# Patient Record
Sex: Male | Born: 1940 | Race: White | Hispanic: No | Marital: Married | Smoking: Never smoker
Health system: Southern US, Community
[De-identification: ages and names within clinical notes are randomized; demographics above are authoritative.]

## PROBLEM LIST (undated history)

## (undated) DIAGNOSIS — D649 Anemia, unspecified: Secondary | ICD-10-CM

## (undated) DIAGNOSIS — H9190 Unspecified hearing loss, unspecified ear: Secondary | ICD-10-CM

## (undated) DIAGNOSIS — K5790 Diverticulosis of intestine, part unspecified, without perforation or abscess without bleeding: Secondary | ICD-10-CM

## (undated) DIAGNOSIS — M1990 Unspecified osteoarthritis, unspecified site: Secondary | ICD-10-CM

## (undated) DIAGNOSIS — R519 Headache, unspecified: Secondary | ICD-10-CM

## (undated) DIAGNOSIS — J302 Other seasonal allergic rhinitis: Secondary | ICD-10-CM

## (undated) DIAGNOSIS — B9681 Helicobacter pylori [H. pylori] as the cause of diseases classified elsewhere: Secondary | ICD-10-CM

## (undated) DIAGNOSIS — M519 Unspecified thoracic, thoracolumbar and lumbosacral intervertebral disc disorder: Secondary | ICD-10-CM

## (undated) DIAGNOSIS — G43909 Migraine, unspecified, not intractable, without status migrainosus: Secondary | ICD-10-CM

## (undated) DIAGNOSIS — G8929 Other chronic pain: Secondary | ICD-10-CM

## (undated) DIAGNOSIS — K219 Gastro-esophageal reflux disease without esophagitis: Secondary | ICD-10-CM

## (undated) DIAGNOSIS — I10 Essential (primary) hypertension: Secondary | ICD-10-CM

## (undated) DIAGNOSIS — H409 Unspecified glaucoma: Secondary | ICD-10-CM

## (undated) DIAGNOSIS — M549 Dorsalgia, unspecified: Secondary | ICD-10-CM

## (undated) DIAGNOSIS — K279 Peptic ulcer, site unspecified, unspecified as acute or chronic, without hemorrhage or perforation: Secondary | ICD-10-CM

## (undated) DIAGNOSIS — R51 Headache: Secondary | ICD-10-CM

## (undated) DIAGNOSIS — K635 Polyp of colon: Secondary | ICD-10-CM

## (undated) DIAGNOSIS — G4700 Insomnia, unspecified: Secondary | ICD-10-CM

## (undated) HISTORY — DX: Anemia, unspecified: D64.9

## (undated) HISTORY — DX: Polyp of colon: K63.5

## (undated) HISTORY — DX: Other seasonal allergic rhinitis: J30.2

## (undated) HISTORY — DX: Insomnia, unspecified: G47.00

## (undated) HISTORY — DX: Unspecified thoracic, thoracolumbar and lumbosacral intervertebral disc disorder: M51.9

## (undated) HISTORY — DX: Gastro-esophageal reflux disease without esophagitis: K21.9

## (undated) HISTORY — DX: Essential (primary) hypertension: I10

## (undated) HISTORY — PX: APPENDECTOMY: SHX54

## (undated) HISTORY — DX: Helicobacter pylori (H. pylori) as the cause of diseases classified elsewhere: B96.81

## (undated) HISTORY — PX: CYST EXCISION: SHX5701

## (undated) HISTORY — DX: Diverticulosis of intestine, part unspecified, without perforation or abscess without bleeding: K57.90

## (undated) HISTORY — DX: Helicobacter pylori (H. pylori) as the cause of diseases classified elsewhere: K27.9

---

## 2002-03-05 ENCOUNTER — Ambulatory Visit (HOSPITAL_BASED_OUTPATIENT_CLINIC_OR_DEPARTMENT_OTHER): Admission: RE | Admit: 2002-03-05 | Discharge: 2002-03-05 | Payer: Self-pay | Admitting: General Surgery

## 2005-04-05 ENCOUNTER — Ambulatory Visit: Payer: Self-pay | Admitting: Internal Medicine

## 2005-04-27 ENCOUNTER — Ambulatory Visit: Payer: Self-pay | Admitting: Internal Medicine

## 2006-06-08 ENCOUNTER — Ambulatory Visit (HOSPITAL_COMMUNITY): Admission: RE | Admit: 2006-06-08 | Discharge: 2006-06-08 | Payer: Self-pay | Admitting: Neurological Surgery

## 2008-08-27 ENCOUNTER — Ambulatory Visit (HOSPITAL_COMMUNITY): Admission: RE | Admit: 2008-08-27 | Discharge: 2008-08-27 | Payer: Self-pay | Admitting: Neurological Surgery

## 2009-06-25 ENCOUNTER — Encounter (INDEPENDENT_AMBULATORY_CARE_PROVIDER_SITE_OTHER): Payer: Self-pay | Admitting: *Deleted

## 2009-06-25 ENCOUNTER — Encounter: Admission: RE | Admit: 2009-06-25 | Discharge: 2009-06-25 | Payer: Self-pay | Admitting: Internal Medicine

## 2009-09-23 ENCOUNTER — Encounter: Payer: Self-pay | Admitting: Internal Medicine

## 2009-09-26 ENCOUNTER — Ambulatory Visit: Payer: Self-pay | Admitting: Internal Medicine

## 2009-09-26 DIAGNOSIS — D649 Anemia, unspecified: Secondary | ICD-10-CM | POA: Insufficient documentation

## 2009-09-26 DIAGNOSIS — R110 Nausea: Secondary | ICD-10-CM | POA: Insufficient documentation

## 2009-09-26 DIAGNOSIS — K5900 Constipation, unspecified: Secondary | ICD-10-CM | POA: Insufficient documentation

## 2009-09-26 DIAGNOSIS — R1084 Generalized abdominal pain: Secondary | ICD-10-CM | POA: Insufficient documentation

## 2009-09-29 ENCOUNTER — Ambulatory Visit: Payer: Self-pay | Admitting: Internal Medicine

## 2009-09-30 ENCOUNTER — Encounter: Payer: Self-pay | Admitting: Internal Medicine

## 2009-09-30 ENCOUNTER — Telehealth (INDEPENDENT_AMBULATORY_CARE_PROVIDER_SITE_OTHER): Payer: Self-pay | Admitting: *Deleted

## 2009-10-01 ENCOUNTER — Ambulatory Visit: Payer: Self-pay | Admitting: Internal Medicine

## 2009-10-01 LAB — CONVERTED CEMR LAB: BUN: 13 mg/dL (ref 6–23)

## 2009-10-02 ENCOUNTER — Ambulatory Visit: Payer: Self-pay | Admitting: Cardiovascular Disease

## 2009-10-28 ENCOUNTER — Ambulatory Visit: Payer: Self-pay | Admitting: Internal Medicine

## 2009-10-28 DIAGNOSIS — K259 Gastric ulcer, unspecified as acute or chronic, without hemorrhage or perforation: Secondary | ICD-10-CM | POA: Insufficient documentation

## 2009-12-08 ENCOUNTER — Telehealth: Payer: Self-pay | Admitting: Internal Medicine

## 2010-04-20 ENCOUNTER — Encounter (INDEPENDENT_AMBULATORY_CARE_PROVIDER_SITE_OTHER): Payer: Self-pay | Admitting: *Deleted

## 2011-01-21 NOTE — Letter (Signed)
Summary: Colonoscopy Letter  [facility]  [address] [place], [place] [address]   Phone: [phone]  Fax: [phone]      Apr 20, 2010 [name]: [id number]   Andrew Pacheco [address] [place], [place]  [address]   Dear Mr. PASQUAL,   According to your medical record, it is time for you to schedule a Colonoscopy. The American Cancer Society recommends this procedure as a method to detect early colon cancer. Patients with a family history of colon cancer, or a personal history of colon polyps or inflammatory bowel disease are at increased risk.  This letter has beeen generated based on the recommendations made at the time of your procedure. If you feel that in your particular situation this may no longer apply, please contact our office.  Please call our office at [phone] to schedule this appointment or to update your records at your earliest convenience.  Thank you for cooperating with [place] to provide you with the very best care possible.   Sincerely,    Wilhemina Bonito. Marina Goodell, M.D.  [facility] [phone]

## 2011-06-01 ENCOUNTER — Other Ambulatory Visit: Payer: Self-pay | Admitting: Internal Medicine

## 2011-07-08 ENCOUNTER — Telehealth: Payer: Self-pay | Admitting: *Deleted

## 2011-07-08 NOTE — Telephone Encounter (Signed)
Called pt at home number, [phone].  Had to leave message for pt to call us back regarding it being time to schedule the next recall colonoscopy that was due 2011.  Advised pt to call us and he can schedule with a scheduler or he is welcome to call me.

## 2011-07-15 NOTE — Telephone Encounter (Signed)
Called the patient on his home phone [phone] and left a message.  I advised it is time for him to schedule his colonoscopy again.  His last was 04/27/2005 and it was recommended he repeat it in 5 years. I left our office number and advised the schedulers would be glad to assist him with scheduling the colonoscopy and free nurse visit.

## 2012-10-12 ENCOUNTER — Other Ambulatory Visit: Payer: Self-pay | Admitting: Dermatology

## 2012-11-15 ENCOUNTER — Other Ambulatory Visit (HOSPITAL_COMMUNITY): Payer: Self-pay | Admitting: Neurological Surgery

## 2012-11-15 DIAGNOSIS — M4714 Other spondylosis with myelopathy, thoracic region: Secondary | ICD-10-CM

## 2012-11-20 ENCOUNTER — Ambulatory Visit (HOSPITAL_COMMUNITY)
Admission: RE | Admit: 2012-11-20 | Discharge: 2012-11-20 | Disposition: A | Discharge status: Home / Self Care | Payer: Medicare Other | Source: Ambulatory Visit | Attending: Neurological Surgery | Admitting: Neurological Surgery

## 2012-11-20 DIAGNOSIS — M4714 Other spondylosis with myelopathy, thoracic region: Secondary | ICD-10-CM

## 2014-04-25 ENCOUNTER — Encounter: Payer: Self-pay | Admitting: Internal Medicine

## 2014-05-27 ENCOUNTER — Telehealth: Payer: Self-pay | Admitting: Internal Medicine

## 2014-05-27 NOTE — Telephone Encounter (Signed)
Pt has colon scheduled for July. States he has noticed a little blood on the toilet tissue when he wipes but the past few days it has increased. States the tissue had a spot about the size of a quarter on it and he dripped 3 places on his clothing when he stood up yesterday. Pt scheduled to see Dr. Henrene Pastor 05/29/14@11am . Pt aware of appt.

## 2014-05-29 ENCOUNTER — Ambulatory Visit (INDEPENDENT_AMBULATORY_CARE_PROVIDER_SITE_OTHER): Payer: Medicare Other | Admitting: Internal Medicine

## 2014-05-29 ENCOUNTER — Encounter: Payer: Self-pay | Admitting: Internal Medicine

## 2014-05-29 VITALS — BP 114/76 | HR 84 | Ht 69.0 in | Wt 186.0 lb

## 2014-05-29 DIAGNOSIS — K625 Hemorrhage of anus and rectum: Secondary | ICD-10-CM

## 2014-05-29 DIAGNOSIS — D509 Iron deficiency anemia, unspecified: Secondary | ICD-10-CM

## 2014-05-29 DIAGNOSIS — Z8711 Personal history of peptic ulcer disease: Secondary | ICD-10-CM

## 2014-05-29 MED ORDER — MOVIPREP 100 G PO SOLR: 1.0000 | Freq: Once | ORAL | Status: DC

## 2014-05-29 NOTE — Patient Instructions (Signed)
You have been scheduled for an endoscopy and colonoscopy with propofol. Please follow the written instructions given to you at your visit today. Please pick up your prep at the pharmacy within the next 1-3 days. If you use inhalers (even only as needed), please bring them with you on the day of your procedure. Your physician has requested that you go to www.startemmi.com and enter the access code given to you at your visit today. This web site gives a general overview about your procedure. However, you should still follow specific instructions given to you by our office regarding your preparation for the procedure.   Stop your iron today.

## 2014-05-29 NOTE — Progress Notes (Signed)
HISTORY OF PRESENT ILLNESS:  Andrew Pacheco is a 73 y.o. male with hypertension, chronic back pain, and a history of H. pylori associated ulcer disease who is sent today regarding iron deficiency anemia. The patient was last evaluated in 2010 for nausea, constipation, abdominal discomfort, and anemia. Upper endoscopy was performed and revealed multiple superficial antral ulcers. Testing for Helicobacter pylori returned positive. He was treated with Prevpac x2 weeks. CT scan of the abdomen and pelvis at that time was essentially unremarkable. He has not been seen since. Patient reports an 18 month history of progressive fatigue. He underwent cardiac evaluation which was negative. He had been on hydrocodone for chronic back pain, but this was changed to Naprosyn 3 times daily about one year ago. Recent evaluation with his primary provider, Dr. Sharlett Iles, revealed anemia with a hemoglobin of 9.6. Microcytic indices. Other laboratories were normal. Hemoccult study was negative, though apparently positive blood on digital rectal exam. The patient's GI review of systems is remarkable for chronic constipation and intermittent rectal bleeding associated with intermittent rectal discomfort. Also, mild dysphagia. He denies weight loss or melena. Started on iron about 2 weeks ago. Also started on PPI (Prilosec 20 mg 3 times a day) at that time. He is accompanied today by his wife. Patient did undergo complete colonoscopy in May of 2006. Examination revealed diverticulosis. A diminutive sigmoid colon polyp was removed and found to be hyperplastic.  REVIEW OF SYSTEMS:  All non-GI ROS negative except for sinus and allergy trouble, arthritis, back pain, fatigue, headaches, shortness of breath, sleeping problems  Past Medical History  Diagnosis Date  . Anemia   . Lumbar disc disease   . 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 GERD (gastroesophageal reflux disease)   . Seasonal allergies   . Glaucoma   . HTN (hypertension)   .  Colon polyps   . Diverticulosis   . H pylori ulcer     Past Surgical History  Procedure Laterality Date  . Appendectomy    . Knee surgery Right     cyst removed    Social History Andrew Pacheco  reports that he has never smoked. He has never used smokeless tobacco. He reports that he drinks alcohol. He reports that he does not use illicit drugs.  family history includes Arthritis in his mother; Cancer in his mother; Diverticulosis in his father; Emphysema in his father; Heart disease in his father; Liver cancer in his mother.  No Known Allergies     PHYSICAL EXAMINATION: Vital signs: BP 114/76  Pulse 84  Ht 5\' 9"  (1.753 m)  Wt 186 lb (84.369 kg)  BMI 27.45 kg/m2  Constitutional: generally well-appearing, no acute distress Psychiatric: alert and oriented x3, cooperative Eyes: extraocular movements intact, anicteric, conjunctiva pink Mouth: oral pharynx moist, no lesions Neck: supple no lymphadenopathy Cardiovascular: heart regular rate and rhythm, no murmur Lungs: clear to auscultation bilaterally Abdomen: soft, mild abdominal fullness and tenderness, nondistended, no obvious ascites, no peritoneal signs, normal bowel sounds, no organomegaly Rectal: Deferred until colonoscopy Extremities: no lower extremity edema bilaterally Skin: no lesions on visible extremities Neuro: No focal deficits. No asterixis.    ASSESSMENT:  #1. Iron deficiency anemia. Likely cause for progressive fatigue #2. Intermittent rectal bleeding and rectal discomfort. Rule out seizure. Rule out hemorrhoids  #3. Chronic constipation. Ongoing #4. History of peptic ulcer disease.Has been on chronic NSAIDs. Rule out recurrence #5. Mild dysphagia.  #6. General medical problems.   PLAN:  #1. Colonoscopy  and upper endoscopy this Friday to evaluate iron deficiency anemia and other GI symptoms as listed.The nature of the procedure, as well as the risks, benefits, and alternatives were carefully and  thoroughly reviewed with the patient. Ample time for discussion and questions allowed. The patient understood, was satisfied, and agreed to proceed. #2. Hold iron for now to assist with bowel prep #3. Continue PPI #4. Probably best that he use something other than NSAIDs for back pain

## 2014-05-31 ENCOUNTER — Ambulatory Visit (AMBULATORY_SURGERY_CENTER): Payer: Medicare Other | Admitting: Internal Medicine

## 2014-05-31 ENCOUNTER — Encounter: Payer: Self-pay | Admitting: Internal Medicine

## 2014-05-31 VITALS — BP 133/75 | HR 59 | Temp 96.5°F | Resp 14 | Ht 69.0 in | Wt 186.0 lb

## 2014-05-31 DIAGNOSIS — D1330 Benign neoplasm of unspecified part of small intestine: Secondary | ICD-10-CM

## 2014-05-31 DIAGNOSIS — K625 Hemorrhage of anus and rectum: Secondary | ICD-10-CM

## 2014-05-31 DIAGNOSIS — K5730 Diverticulosis of large intestine without perforation or abscess without bleeding: Secondary | ICD-10-CM

## 2014-05-31 DIAGNOSIS — D509 Iron deficiency anemia, unspecified: Secondary | ICD-10-CM

## 2014-05-31 MED ORDER — SODIUM CHLORIDE 0.9 % IV SOLN: 500.0000 mL | INTRAVENOUS | Status: DC

## 2014-05-31 NOTE — Progress Notes (Signed)
Report to PACU, RN, vss, BBS= Clear.  

## 2014-05-31 NOTE — Op Note (Signed)
[place]  [address] [place], [address]   ENDOSCOPY PROCEDURE REPORT  PATIENT: Andrew Pacheco, Andrew Pacheco  MR#: [id number] BIRTHDATE: 1941-09-09 , 73  yrs. old GENDER: Male ENDOSCOPIST: Eustace Quail, MD REFERRED BY:  [name], M.D. PROCEDURE DATE:  05/31/2014 PROCEDURE:  EGD w/ biopsy ASA CLASS:     Class II INDICATIONS:  Iron deficiency anemia. MEDICATIONS: MAC sedation, administered by CRNA and propofol (Diprivan) 120mg  IV TOPICAL ANESTHETIC: none  DESCRIPTION OF PROCEDURE: After the risks benefits and alternatives of the procedure were thoroughly explained, informed consent was obtained.  The LB [id number] endoscope was introduced through the mouth and advanced to the third portion of the duodenum. Without limitations.  The instrument was slowly withdrawn as the mucosa was fully examined.    EXAM:The upper, middle and distal third of the esophagus were carefully inspected and no abnormalities were noted.  The z-line was well seen at the GEJ.  The endoscope was pushed into the fundus which was normal including a retroflexed view.  The antrum, gastric body, first and second part of the duodenum were unremarkable. Duodenal biopsies taken to rule out celiac sprue.  Retroflexed views revealed a hiatal hernia.     The scope was then withdrawn from the patient and the procedure completed.  COMPLICATIONS: There were no complications. ENDOSCOPIC IMPRESSION: 1. Normal EGD 2. Iron deficiency anemia  RECOMMENDATIONS: 1.  Await biopsy results 2.  Continue current medications, including iron. 3.  My office will schedule Capsule endoscopy. This is a pill camera to evaluate your small intestine  REPEAT EXAM:  eSigned:  Eustace Quail, MD 05/31/2014 3:25 PM   AT:FTDDUK Philip Aspen, MD and The Patient

## 2014-05-31 NOTE — Op Note (Signed)
[place]  [address] [place], [address]   COLONOSCOPY PROCEDURE REPORT  PATIENT: Andrew Pacheco, Andrew Pacheco  MR#: [id number] BIRTHDATE: 25-Mar-1941 , 73  yrs. old GENDER: Male ENDOSCOPIST: Eustace Quail, MD REFERRED ZP:[id number] Philip Aspen, M.D. PROCEDURE DATE:  05/31/2014 PROCEDURE:   Colonoscopy, diagnostic First Screening Colonoscopy - Avg.  risk and is 50 yrs.  old or older - No.  Prior Negative Screening - Now for repeat screening. N/A  History of Adenoma - Now for follow-up colonoscopy & has been > or = to 3 yrs.  N/A  Polyps Removed Today? No.  Recommend repeat exam, <10 yrs? No. ASA CLASS:   Class II INDICATIONS:Iron Deficiency Anemia and rectal bleeding.  . Previous colonoscopy 2006 with diverticulosis only MEDICATIONS: MAC sedation, administered by CRNA and propofol (Diprivan) 230mg  IV  DESCRIPTION OF PROCEDURE:   After the risks benefits and alternatives of the procedure were thoroughly explained, informed consent was obtained.  A digital rectal exam revealed no abnormalities of the rectum.   The LB [id number]  endoscope was introduced through the anus and advanced to the cecum, which was identified by both the appendix and ileocecal valve. No adverse events experienced.   The quality of the prep was excellent, using MoviPrep  The instrument was then slowly withdrawn as the colon was fully examined.  COLON FINDINGS: The mucosa appeared normal in the terminal ileum. Moderate diverticulosis was noted The finding was in the left colon.   The colon mucosa was otherwise normal.  Retroflexed views revealed internal hemorrhoids. The time to cecum=3 minutes 29 seconds.  Withdrawal time=9 minutes 59 seconds.  The scope was withdrawn and the procedure completed. COMPLICATIONS: There were no complications.  ENDOSCOPIC IMPRESSION: 1.   Normal mucosa in the terminal ileum 2.   Moderate diverticulosis was noted in the left colon 3.   The colon mucosa was  otherwise normal  RECOMMENDATIONS: 1.Upper endoscopy today (see report)   eSigned:  Eustace Quail, MD 05/31/2014 3:21 PM   cc: [name], MD and The Patient

## 2014-05-31 NOTE — Patient Instructions (Signed)
YOU HAD AN ENDOSCOPIC PROCEDURE TODAY AT THE [facility]: Refer to the procedure report that was given to you for any specific questions about what was found during the examination.  If the procedure report does not answer your questions, please call your gastroenterologist to clarify.  If you requested that your care partner not be given the details of your procedure findings, then the procedure report has been included in a sealed envelope for you to review at your convenience later.  YOU SHOULD EXPECT: Some feelings of bloating in the abdomen. Passage of more gas than usual.  Walking can help get rid of the air that was put into your GI tract during the procedure and reduce the bloating. If you had a lower endoscopy (such as a colonoscopy or flexible sigmoidoscopy) you may notice spotting of blood in your stool or on the toilet paper. If you underwent a bowel prep for your procedure, then you may not have a normal bowel movement for a few days.  DIET: Your first meal following the procedure should be a light meal and then it is ok to progress to your normal diet.  A half-sandwich or bowl of soup is an example of a good first meal.  Heavy or fried foods are harder to digest and may make you feel nauseous or bloated.  Likewise meals heavy in dairy and vegetables can cause extra gas to form and this can also increase the bloating.  Drink plenty of fluids but you should avoid alcoholic beverages for 24 hours.  ACTIVITY: Your care partner should take you home directly after the procedure.  You should plan to take it easy, moving slowly for the rest of the day.  You can resume normal activity the day after the procedure however you should NOT DRIVE or use heavy machinery for 24 hours (because of the sedation medicines used during the test).    SYMPTOMS TO REPORT IMMEDIATELY: A gastroenterologist can be reached at any hour.  During normal business hours, 8:30 AM to 5:00 PM Monday through Friday,  call [phone].  After hours and on weekends, please call the GI answering service at [phone] who will take a message and have the physician on call contact you.   Following lower endoscopy (colonoscopy or flexible sigmoidoscopy):  Excessive amounts of blood in the stool  Significant tenderness or worsening of abdominal pains  Swelling of the abdomen that is new, acute  Fever of 100F or higher  Following upper endoscopy (EGD)  Vomiting of blood or coffee ground material  New chest pain or pain under the shoulder blades  Painful or persistently difficult swallowing  New shortness of breath  Fever of 100F or higher  Black, tarry-looking stools  FOLLOW UP: If any biopsies were taken you will be contacted by phone or by letter within the next 1-3 weeks.  Call your gastroenterologist if you have not heard about the biopsies in 3 weeks.  Our staff will call the home number listed on your records the next business day following your procedure to check on you and address any questions or concerns that you may have at that time regarding the information given to you following your procedure. This is a courtesy call and so if there is no answer at the home number and we have not heard from you through the emergency physician on call, we will assume that you have returned to your regular daily activities without incident.  SIGNATURES/CONFIDENTIALITY: You and/or your care   partner have signed paperwork which will be entered into your electronic medical record.  These signatures attest to the fact that that the information above on your After Visit Summary has been reviewed and is understood.  Full responsibility of the confidentiality of this discharge information lies with you and/or your care-partner.  Recommendations Await biopsy results Continue current medications including iron Capsule endoscopy, pill camera, to evaluate your small intestine.

## 2014-05-31 NOTE — Progress Notes (Signed)
Called to room to assist during endoscopic procedure.  Patient ID and intended procedure confirmed with present staff. Received instructions for my participation in the procedure from the performing physician.  

## 2014-06-02 ENCOUNTER — Telehealth: Payer: Self-pay | Admitting: Gastroenterology

## 2014-06-02 NOTE — Telephone Encounter (Signed)
Last evening he developed dizziness which has persistent 2 this morning.  He feels like room is spinning and he's had some nausea and vomiting.  He denies lightheadedness or abdominal pain.  He underwent a colonoscopy and upper endoscopy 2 days ago.  I explained to his wife that patient sounds like he is having vertigo.  I instructed her to contact his PCP.

## 2014-06-03 ENCOUNTER — Telehealth: Payer: Self-pay | Admitting: *Deleted

## 2014-06-03 NOTE — Telephone Encounter (Signed)
  Follow up Call-  Call back number 05/31/2014  Post procedure Call Back phone  # [phone]  Permission to leave phone message Yes     Patient questions:  Do you have a fever, pain , or abdominal swelling? no Pain Score  0 *  Have you tolerated food without any problems? no  Patient called and spoke with Dr. Deatra Ina regarding dizziness; inner ear  Have you been able to return to your normal activities? yes  Do you have any questions about your discharge instructions: Diet   no Medications  no Follow up visit  yes  Do you have questions or concerns about your Care? no  Actions: * If pain score is 4 or above: No action needed, pain <4.

## 2014-06-06 ENCOUNTER — Other Ambulatory Visit: Payer: Self-pay

## 2014-06-06 DIAGNOSIS — D509 Iron deficiency anemia, unspecified: Secondary | ICD-10-CM

## 2014-06-11 ENCOUNTER — Encounter: Payer: Self-pay | Admitting: Internal Medicine

## 2014-06-12 ENCOUNTER — Telehealth: Payer: Self-pay

## 2014-06-12 NOTE — Telephone Encounter (Signed)
Pt needs to have capsule endo to eval small intestine per Dr. Henrene Pastor. Left message for pt to call back and schedule.

## 2014-06-13 NOTE — Telephone Encounter (Signed)
Spoke with pt and pt scheduled for capsule teaching 7/6, and capsule scheduled for 06/26/14. Pt aware of appts.

## 2014-06-25 ENCOUNTER — Encounter: Payer: Medicare Other | Admitting: Internal Medicine

## 2014-06-26 ENCOUNTER — Ambulatory Visit (INDEPENDENT_AMBULATORY_CARE_PROVIDER_SITE_OTHER): Payer: Medicare Other | Admitting: Internal Medicine

## 2014-06-26 DIAGNOSIS — D649 Anemia, unspecified: Secondary | ICD-10-CM

## 2014-06-26 DIAGNOSIS — K625 Hemorrhage of anus and rectum: Secondary | ICD-10-CM

## 2014-06-26 DIAGNOSIS — D509 Iron deficiency anemia, unspecified: Secondary | ICD-10-CM

## 2014-06-26 NOTE — Progress Notes (Signed)
Patient here for a capsule endoscopy.  Patient tolerated procedure well and verbalized understanding of all written and verbal instructions.  Capsule endoscopy [id number] lot # [id number] exp 06/2015

## 2014-06-28 ENCOUNTER — Telehealth: Payer: Self-pay

## 2014-06-28 DIAGNOSIS — T189XXD Foreign body of alimentary tract, part unspecified, subsequent encounter: Secondary | ICD-10-CM

## 2014-06-28 NOTE — Telephone Encounter (Signed)
Message copied by Algernon Huxley on Fri Jun 28, 2014  2:04 PM ------      Message from: [place], [place] S      Created: Thu Jun 27, 2014  3:26 PM      Regarding: Capsule       JP- capsule for you to review on this pt.            Study was incomplete- Vaughan Basta please ask him to come in for a KUB Monday 7/13 ------

## 2014-06-28 NOTE — Telephone Encounter (Signed)
Pt does not think he passed the capsule. Pt will come Monday for KUB.

## 2014-07-01 ENCOUNTER — Ambulatory Visit (INDEPENDENT_AMBULATORY_CARE_PROVIDER_SITE_OTHER)
Admission: RE | Admit: 2014-07-01 | Discharge: 2014-07-01 | Disposition: A | Discharge status: Home / Self Care | Payer: Medicare Other | Source: Ambulatory Visit | Attending: Gastroenterology | Admitting: Gastroenterology

## 2014-07-01 ENCOUNTER — Ambulatory Visit (INDEPENDENT_AMBULATORY_CARE_PROVIDER_SITE_OTHER)
Admission: RE | Admit: 2014-07-01 | Discharge: 2014-07-01 | Disposition: A | Discharge status: Home / Self Care | Payer: Medicare Other | Source: Ambulatory Visit | Attending: Internal Medicine | Admitting: Internal Medicine

## 2014-07-01 ENCOUNTER — Other Ambulatory Visit: Payer: Self-pay

## 2014-07-01 DIAGNOSIS — Z5189 Encounter for other specified aftercare: Secondary | ICD-10-CM

## 2014-07-01 DIAGNOSIS — T182XXD Foreign body in stomach, subsequent encounter: Secondary | ICD-10-CM

## 2014-07-01 DIAGNOSIS — T189XXD Foreign body of alimentary tract, part unspecified, subsequent encounter: Secondary | ICD-10-CM

## 2014-07-23 ENCOUNTER — Other Ambulatory Visit: Payer: Self-pay

## 2014-07-23 ENCOUNTER — Telehealth: Payer: Self-pay | Admitting: Internal Medicine

## 2014-07-23 DIAGNOSIS — D509 Iron deficiency anemia, unspecified: Secondary | ICD-10-CM

## 2014-07-23 NOTE — Telephone Encounter (Signed)
Pt is calling for capsule endo results. Please advise.

## 2014-07-23 NOTE — Telephone Encounter (Signed)
Left message for pt to call back.  Spoke with pt and he is aware. Order in epic.

## 2014-07-23 NOTE — Telephone Encounter (Signed)
Andrew Pacheco, let patient know that I have reviewed his capsule endoscopy. He has multiple erosions in the small bowel consistent with NSAID-induced injury. He had been on Naprosyn. May still be? I recommend that he avoid NSAIDs if possible. Continue with twice daily iron supplementation. Repeat CBC at this time. We will monitor his blood counts on iron.

## 2014-07-24 ENCOUNTER — Other Ambulatory Visit (INDEPENDENT_AMBULATORY_CARE_PROVIDER_SITE_OTHER): Payer: Medicare Other

## 2014-07-24 ENCOUNTER — Encounter: Payer: Self-pay | Admitting: Internal Medicine

## 2014-07-24 DIAGNOSIS — D509 Iron deficiency anemia, unspecified: Secondary | ICD-10-CM

## 2014-07-24 LAB — CBC WITH DIFFERENTIAL/PLATELET
BASOS ABS: 0 10*3/uL (ref 0.0–0.1)
Basophils Relative: 0.5 % (ref 0.0–3.0)
EOS ABS: 0.1 10*3/uL (ref 0.0–0.7)
EOS PCT: 3.4 % (ref 0.0–5.0)
HEMATOCRIT: 31.9 % — AB (ref 39.0–52.0)
Hemoglobin: 10.3 g/dL — ABNORMAL LOW (ref 13.0–17.0)
LYMPHS ABS: 0.9 10*3/uL (ref 0.7–4.0)
Lymphocytes Relative: 22.6 % (ref 12.0–46.0)
MCHC: 32.5 g/dL (ref 30.0–36.0)
MCV: 83.4 fl (ref 78.0–100.0)
MONO ABS: 0.5 10*3/uL (ref 0.1–1.0)
Monocytes Relative: 11 % (ref 3.0–12.0)
Neutro Abs: 2.6 10*3/uL (ref 1.4–7.7)
Neutrophils Relative %: 62.5 % (ref 43.0–77.0)
PLATELETS: 188 10*3/uL (ref 150.0–400.0)
RBC: 3.82 Mil/uL — ABNORMAL LOW (ref 4.22–5.81)
RDW: 19.3 % — AB (ref 11.5–15.5)
WBC: 4.2 10*3/uL (ref 4.0–10.5)

## 2014-07-26 ENCOUNTER — Other Ambulatory Visit: Payer: Self-pay

## 2014-07-26 DIAGNOSIS — D508 Other iron deficiency anemias: Secondary | ICD-10-CM

## 2014-09-04 ENCOUNTER — Telehealth: Payer: Self-pay

## 2014-09-04 NOTE — Telephone Encounter (Signed)
Pt states he will come Monday for labs. 

## 2014-09-04 NOTE — Telephone Encounter (Signed)
Message copied by Algernon Huxley on Wed Sep 04, 2014 11:21 AM ------      Message from: Sherly Brodbeck, [place] R      Created: Fri Jul 26, 2014  1:11 PM      Regarding: CBC       Needs CBC in 6 weeks ------

## 2014-09-09 ENCOUNTER — Other Ambulatory Visit (INDEPENDENT_AMBULATORY_CARE_PROVIDER_SITE_OTHER): Payer: Medicare Other

## 2014-09-09 DIAGNOSIS — D508 Other iron deficiency anemias: Secondary | ICD-10-CM

## 2014-09-09 LAB — CBC WITH DIFFERENTIAL/PLATELET
Basophils Absolute: 0 10*3/uL (ref 0.0–0.1)
Basophils Relative: 0.2 % (ref 0.0–3.0)
EOS ABS: 0.1 10*3/uL (ref 0.0–0.7)
Eosinophils Relative: 1.5 % (ref 0.0–5.0)
HEMATOCRIT: 35.2 % — AB (ref 39.0–52.0)
HEMOGLOBIN: 11.6 g/dL — AB (ref 13.0–17.0)
LYMPHS ABS: 1.2 10*3/uL (ref 0.7–4.0)
Lymphocytes Relative: 17.4 % (ref 12.0–46.0)
MCHC: 32.9 g/dL (ref 30.0–36.0)
MCV: 85.4 fl (ref 78.0–100.0)
Monocytes Absolute: 0.9 10*3/uL (ref 0.1–1.0)
Monocytes Relative: 12.5 % — ABNORMAL HIGH (ref 3.0–12.0)
NEUTROS ABS: 4.8 10*3/uL (ref 1.4–7.7)
Neutrophils Relative %: 68.4 % (ref 43.0–77.0)
Platelets: 214 10*3/uL (ref 150.0–400.0)
RBC: 4.12 Mil/uL — ABNORMAL LOW (ref 4.22–5.81)
RDW: 18.7 % — AB (ref 11.5–15.5)
WBC: 7 10*3/uL (ref 4.0–10.5)

## 2014-09-10 ENCOUNTER — Other Ambulatory Visit: Payer: Self-pay

## 2014-09-10 DIAGNOSIS — D509 Iron deficiency anemia, unspecified: Secondary | ICD-10-CM

## 2014-10-07 ENCOUNTER — Other Ambulatory Visit (INDEPENDENT_AMBULATORY_CARE_PROVIDER_SITE_OTHER): Payer: Medicare Other

## 2014-10-07 ENCOUNTER — Telehealth: Payer: Self-pay

## 2014-10-07 DIAGNOSIS — D509 Iron deficiency anemia, unspecified: Secondary | ICD-10-CM

## 2014-10-07 LAB — CBC WITH DIFFERENTIAL/PLATELET
BASOS ABS: 0 10*3/uL (ref 0.0–0.1)
Basophils Relative: 0.3 % (ref 0.0–3.0)
EOS ABS: 0.1 10*3/uL (ref 0.0–0.7)
Eosinophils Relative: 2.5 % (ref 0.0–5.0)
HCT: 35.3 % — ABNORMAL LOW (ref 39.0–52.0)
Hemoglobin: 11.6 g/dL — ABNORMAL LOW (ref 13.0–17.0)
LYMPHS ABS: 1 10*3/uL (ref 0.7–4.0)
LYMPHS PCT: 22.9 % (ref 12.0–46.0)
MCHC: 32.8 g/dL (ref 30.0–36.0)
MCV: 85.7 fl (ref 78.0–100.0)
Monocytes Absolute: 0.5 10*3/uL (ref 0.1–1.0)
Monocytes Relative: 11.3 % (ref 3.0–12.0)
Neutro Abs: 2.9 10*3/uL (ref 1.4–7.7)
Neutrophils Relative %: 63 % (ref 43.0–77.0)
PLATELETS: 213 10*3/uL (ref 150.0–400.0)
RBC: 4.12 Mil/uL — ABNORMAL LOW (ref 4.22–5.81)
RDW: 18.4 % — AB (ref 11.5–15.5)
WBC: 4.6 10*3/uL (ref 4.0–10.5)

## 2014-10-07 NOTE — Telephone Encounter (Signed)
Message copied by Algernon Huxley on Mon Oct 07, 2014 11:19 AM ------      Message from: Jamekia Gannett, [place] R      Created: Tue Sep 10, 2014 10:55 AM      Regarding: CBC       Pt needs CBC in 1 month, order in epic. ------

## 2014-10-07 NOTE — Telephone Encounter (Signed)
Pt aware and order in epic. 

## 2014-12-18 ENCOUNTER — Encounter: Payer: Self-pay | Admitting: Internal Medicine

## 2016-09-22 ENCOUNTER — Ambulatory Visit (INDEPENDENT_AMBULATORY_CARE_PROVIDER_SITE_OTHER): Payer: Medicare Other | Admitting: Physical Medicine and Rehabilitation

## 2016-09-22 DIAGNOSIS — M25552 Pain in left hip: Secondary | ICD-10-CM | POA: Diagnosis not present

## 2016-09-22 DIAGNOSIS — M545 Low back pain: Secondary | ICD-10-CM | POA: Diagnosis not present

## 2016-09-22 DIAGNOSIS — R202 Paresthesia of skin: Secondary | ICD-10-CM | POA: Diagnosis not present

## 2016-09-22 DIAGNOSIS — M47816 Spondylosis without myelopathy or radiculopathy, lumbar region: Secondary | ICD-10-CM | POA: Diagnosis not present

## 2016-09-28 ENCOUNTER — Encounter (INDEPENDENT_AMBULATORY_CARE_PROVIDER_SITE_OTHER): Payer: Medicare Other | Admitting: Physical Medicine and Rehabilitation

## 2016-09-28 DIAGNOSIS — M47816 Spondylosis without myelopathy or radiculopathy, lumbar region: Secondary | ICD-10-CM | POA: Diagnosis not present

## 2016-10-20 ENCOUNTER — Encounter (INDEPENDENT_AMBULATORY_CARE_PROVIDER_SITE_OTHER): Payer: Self-pay | Admitting: Physical Medicine and Rehabilitation

## 2016-10-20 ENCOUNTER — Ambulatory Visit (INDEPENDENT_AMBULATORY_CARE_PROVIDER_SITE_OTHER): Payer: Medicare Other | Admitting: Physical Medicine and Rehabilitation

## 2016-10-20 VITALS — BP 161/92 | HR 69

## 2016-10-20 DIAGNOSIS — M545 Low back pain: Secondary | ICD-10-CM | POA: Diagnosis not present

## 2016-10-20 DIAGNOSIS — M609 Myositis, unspecified: Secondary | ICD-10-CM

## 2016-10-20 DIAGNOSIS — G8929 Other chronic pain: Secondary | ICD-10-CM

## 2016-10-20 DIAGNOSIS — M47816 Spondylosis without myelopathy or radiculopathy, lumbar region: Secondary | ICD-10-CM

## 2016-10-20 NOTE — Progress Notes (Signed)
Office Visit Note   Patient: Andrew Pacheco           Date of Birth: Jan 04, 1941           [name]: [id number] Visit Date: 10/20/2016              Requested by: [name], MD [address] [place],  [address] PCP: Donnajean Lopes, MD   Assessment & Plan: Visit Diagnoses:  1. Spondylosis without myelopathy or radiculopathy, lumbar region   2. Chronic midline low back pain without sciatica   3. Myofascitis     Plan: chronic history of low back pain with referral into the hips which has now been resolved almost to a total degree after bilateral facet joint blocks at L4-5 and L5-S1. He now has left-sided complaints after doing a lot of yard work with complete blowing. There seems to be a focal trigger point in thepable and does reproduce his pain. We are going to send him to [facility] physical therapy for dry needling and manual work. If he does get recurrence of his back pain then we would probably look at that point we'll either repeat the injection versus radiofrequency ablation.  Follow-Up Instructions: Return if symptoms worsen or fail to improve, for Pt sent to [facility] PT for dry needling.   Orders:  No orders of the defined types were placed in this encounter.  No orders of the defined types were placed in this encounter.     Procedures: No procedures performed   Clinical Data: No additional findings.   Subjective: Chief Complaint  Patient presents with  . Lower Back - Pain    HPI Andrew Pacheco is a 75 year old gentleman we saw approximately a month ago for a chronic history of low back pain and some referral pattern into the hips. He is seen Dr. Ellene Route in the past and had MRI of the lumbar spine. After rather lengtcess with him we tried to educate him on finding the source of the pain and we did feel like it was mostly facet mediated low back pain at least at this point. I think in the past she has had problems with discs. Had L4-5, L5-S1 facet injections  09/28/16. States he had no pain at all for 10 days after the injections. After day 10 he increased his activity some and started to have occasional shooting pains on the left side of his lower back. He feels like a shooting pain on the left side is different than what he had been experiencing and it does not radiate anywhere. He has nobowel or bladder difficulties no fevers chills or night sweats.his symptoms are still worse with prolonged standing and walking. He can walk a lot further than he did before.  Review of Systems  Constitutional: Negative for chills, fatigue, fever and unexpected weight change.  HENT: Negative for sore throat and trouble swallowing.   Eyes: Negative for photophobia and visual disturbance.  Respiratory: Negative for chest tightness and shortness of breath.   Cardiovascular: Negative for chest pain.  Gastrointestinal: Negative for abdominal pain.  Endocrine: Negative for cold intolerance and heat intolerance.  Musculoskeletal: Negative for myalgias.  Skin: Negative for color change and rash.  Neurological: Negative for speech difficulty and headaches.  Psychiatric/Behavioral: Negative for confusion. The patient is not nervous/anxious.      Objective: Vital Signs: BP (!) 161/92   Pulse 69   Physical Exam  Constitutional: He appears well-developed and well-nourished. No distress.  Eyes: Conjunctivae are normal. Pupils  are equal, round, and reactive to light.  Cardiovascular: Regular rhythm and intact distal pulses.   Pulmonary/Chest: Effort normal and breath sounds normal.  Skin: Skin is warm.  Psychiatric: He has a normal mood and affect.    Ortho Exam The patient arises from a seated position much better than he did when I first saw him. He still has some pain with extension rotation. He has aft quadratus lumbs does reproduce most of his currently new left-sided pain. He has no pain over the greater trochanters as good distal strength.  Specialty Comments:    No specialty comments available.  Imaging: No results found.   PMFS History: Patient Active Problem List   Diagnosis Date Noted  . 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  . ANEMIA-UNSPECIFIED 09/26/2009  . CONSTIPATION 09/26/2009  . NAUSEA 09/26/2009  . ABDOMINAL PAIN -GENERALIZED 09/26/2009   Past Medical History:  Diagnosis Date  . Anemia   . Colon polyps   . Diverticulosis   . GERD (gastroesophageal reflux disease)   . Glaucoma   . H pylori ulcer   . HTN (hypertension)   . 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 Lumbar disc disease   . Seasonal allergies     Family History  Problem Relation Age of Onset  . Heart disease Father   . Emphysema Father   . Diverticulosis Father   . Cancer Mother     gallbladder-mets  . Arthritis Mother   . Liver cancer Mother     Past Surgical History:  Procedure Laterality Date  . APPENDECTOMY    . KNEE SURGERY Right    cyst removed   Social History   Occupational History  . retired    Social History Main Topics  . Smoking status: Never Smoker  . Smokeless tobacco: Never Used  . Alcohol use Yes     Comment: wine twice a year  . Drug use: No  . Sexual activity: Not on file

## 2017-05-17 ENCOUNTER — Telehealth (INDEPENDENT_AMBULATORY_CARE_PROVIDER_SITE_OTHER): Payer: Self-pay | Admitting: Physical Medicine and Rehabilitation

## 2017-05-18 NOTE — Telephone Encounter (Signed)
Yes if nothing new, will eval at same time

## 2017-05-18 NOTE — Telephone Encounter (Signed)
Scheduled for 6/11 at 1400.

## 2017-05-18 NOTE — Telephone Encounter (Signed)
Patient called back and left message. I called him back and the phone was busy.

## 2017-05-18 NOTE — Telephone Encounter (Signed)
Left message for patient to call back to schedule.  °

## 2017-05-30 ENCOUNTER — Encounter (INDEPENDENT_AMBULATORY_CARE_PROVIDER_SITE_OTHER): Payer: Self-pay | Admitting: Physical Medicine and Rehabilitation

## 2017-05-30 ENCOUNTER — Ambulatory Visit (INDEPENDENT_AMBULATORY_CARE_PROVIDER_SITE_OTHER): Payer: Medicare Other | Admitting: Physical Medicine and Rehabilitation

## 2017-05-30 ENCOUNTER — Ambulatory Visit (INDEPENDENT_AMBULATORY_CARE_PROVIDER_SITE_OTHER): Payer: Self-pay

## 2017-05-30 VITALS — BP 146/89 | HR 72

## 2017-05-30 DIAGNOSIS — M545 Low back pain, unspecified: Secondary | ICD-10-CM

## 2017-05-30 DIAGNOSIS — G8929 Other chronic pain: Secondary | ICD-10-CM | POA: Diagnosis not present

## 2017-05-30 DIAGNOSIS — M47816 Spondylosis without myelopathy or radiculopathy, lumbar region: Secondary | ICD-10-CM

## 2017-05-30 MED ORDER — LIDOCAINE HCL (PF) 1 % IJ SOLN
2.0000 mL | Freq: Once | INTRAMUSCULAR | Status: AC
  Administered 2017-05-30: 2 mL

## 2017-05-30 MED ORDER — METHYLPREDNISOLONE ACETATE 80 MG/ML IJ SUSP
80.0000 mg | Freq: Once | INTRAMUSCULAR | Status: AC
  Administered 2017-05-30: 80 mg

## 2017-05-30 NOTE — Patient Instructions (Signed)

## 2017-05-30 NOTE — Procedures (Signed)
Lumbar Facet Joint Intra-Articular Injection(s) with Fluoroscopic Guidance  Patient: Andrew Pacheco      Date of Birth: 06-Sep-1941 [name]: [id number] PCP: [name], MD      Visit Date: 05/30/2017   Universal Protocol:    Date/Time: 06/11/182:33 PM  Consent Given By: the patient  Position: PRONE   Additional Comments: Vital signs were monitored before and after the procedure. Patient was prepped and draped in the usual sterile fashion. The correct patient, procedure, and site was verified.   Injection Procedure Details:  Procedure Site One Meds Administered:  Meds ordered this encounter  Medications  . lidocaine (PF) (XYLOCAINE) 1 % injection 2 mL  . methylPREDNISolone acetate (DEPO-MEDROL) injection 80 mg     Laterality: Bilateral  Location/Site:  L4-L5 L5-S1  Needle size: 22 guage  Needle type: Spinal  Needle Placement: Articular  Findings:  -Contrast Used: 1 mL iohexol 180 mg iodine/mL   -Comments: Excellent flow of contrast producing a partial arthrogram.  Procedure Details: The fluoroscope beam is vertically oriented in AP, and the inferior recess is visualized beneath the lower pole of the inferior apophyseal process, which represents the target point for needle insertion. When direct visualization is difficult the target point is located at the medial projection of the vertebral pedicle. The region overlying each aforementioned target is locally anesthetized with a 1 to 2 ml. volume of 1% Lidocaine without Epinephrine.   The spinal needle was inserted into each of the above mentioned facet joints using biplanar fluoroscopic guidance. A 0.25 to 0.5 ml. volume of Isovue-250 was injected and a partial facet joint arthrogram was obtained. A single spot film was obtained of the resulting arthrogram.    One to 1.25 ml of the steroid/anesthetic solution was then injected into each of the facet joints noted above.   Additional Comments:  The patient tolerated the  procedure well No complications occurred Dressing: Band-Aid    Post-procedure details: Patient was observed during the procedure. Post-procedure instructions were reviewed.  Patient left the clinic in stable condition.

## 2017-05-30 NOTE — Progress Notes (Deleted)
Increased pain across low back around belt line. Worse on left side. Denies leg pain. Constant pain. States he did really well with last injection for several months.

## 2017-05-31 NOTE — Progress Notes (Signed)
Andrew Pacheco - 76 y.o. male [name] [id number]  Date of birth: 30-May-1941  Office Visit Note: Visit Date: 05/30/2017 PCP: [name], MD Referred by: [name], MD  Subjective: Chief Complaint  Patient presents with  . Lower Back - Pain   HPI: Andrew Pacheco is a 76 year old gentleman that we saw last year Andrew Pacheco completed bilateral facet joint blocks at L4-5 and L5-S1 for fairly significant spondylosis and facet arthropathy without stenosis or radicular pain. Andrew Pacheco did well for many months and Andrew Pacheco is having some other health issues and other things that were taking of his time. Andrew Pacheco states over the last 2-3 months Andrew Pacheco is progressively gotten worsening low back pain once again. This is at the belt line. No referral pattern into the buttocks. Worse with prolonged standing but can be worse with prolonged sitting and going from sit to stand. Again no radicular complaints or paresthesias. No focal weakness. We have had MRI evidence of facet arthropathy. Andrew Pacheco has had no new trauma or falls. Andrew Pacheco reports severe pain Andrew Pacheco does use Tylenol at times. Andrew Pacheco has had other medications in the past without much relief. Andrew Pacheco has had chiropractic care in the past without much relief. Andrew Pacheco has been a while since Andrew Pacheco does have focused physical therapy. His symptoms are better at rest. Andrew Pacheco does try to stay very active.    Review of Systems  Constitutional: Negative for chills, fever, malaise/fatigue and weight loss.  HENT: Negative for hearing loss and sinus pain.   Eyes: Negative for blurred vision, double vision and photophobia.  Respiratory: Negative for cough and shortness of breath.   Cardiovascular: Negative for chest pain, palpitations and leg swelling.  Gastrointestinal: Negative for abdominal pain, nausea and vomiting.  Genitourinary: Negative for flank pain.  Musculoskeletal: Positive for back pain. Negative for myalgias.  Skin: Negative for itching and rash.  Neurological: Negative for tremors, focal weakness and  weakness.  Endo/Heme/Allergies: Negative.   Psychiatric/Behavioral: Negative for depression.  All other systems reviewed and are negative.  Otherwise per HPI.  Assessment & Plan: Visit Diagnoses:  1. Spondylosis without myelopathy or radiculopathy, lumbar region   2. Chronic bilateral low back pain without sciatica     Plan: Findings:  Several months of worsening low back pain at the belt line without radicular pain or claudication. Andrew Pacheco has a history of lumbar spondylosis and facet arthropathy at L4-L5-S1. This was diagnosed with therapeutic and diagnostic injection last year with L4-L5-S1 facet joint blocks. Andrew Pacheco's had no other new trauma. At this point I think is worth repeating the injection since Andrew Pacheco did so well. This would again be diagnostic and hopefully therapeutic. If Andrew Pacheco does well again issomething that we could repeat infrequently. We've talked about activity modification and exercises with him today. We can also regroup at some point with a physical therapist for a short course looking at more myofascial pain when she does have some pain on the paraspinal and quadratus lumborum regions. We will do the injection today given the amount of pain that Andrew Pacheco is having. If Andrew Pacheco didn't get much relief again I would regroup with therapy and medication management. In the future we may look at diagnostic medial branch blocks and radiofrequency ablation. I spent more than 25 minutes speaking face-to-face with the patient with 50% of the time in counseling.    Meds & Orders:  Meds ordered this encounter  Medications  . lidocaine (PF) (XYLOCAINE) 1 % injection 2 mL  . methylPREDNISolone acetate (DEPO-MEDROL) injection 80  mg    Orders Placed This Encounter  Procedures  . Facet Injection  . XR C-ARM NO REPORT    Follow-up: Return if symptoms worsen or fail to improve.   Procedures: No procedures performed  Lumbar Facet Joint Intra-Articular Injection(s) with Fluoroscopic Guidance  Patient: Andrew Pacheco      Date of Birth: [birth year] [name]: [id number] PCP: [name], MD      Visit Date: 05/30/2017   Universal Protocol:    Date/Time: 06/11/182:33 PM  Consent Given By: the patient  Position: PRONE   Additional Comments: Vital signs were monitored before and after the procedure. Patient was prepped and draped in the usual sterile fashion. The correct patient, procedure, and site was verified.   Injection Procedure Details:  Procedure Site One Meds Administered:  Meds ordered this encounter  Medications  . lidocaine (PF) (XYLOCAINE) 1 % injection 2 mL  . methylPREDNISolone acetate (DEPO-MEDROL) injection 80 mg     Laterality: Bilateral  Location/Site:  L4-L5 L5-S1  Needle size: 22 guage  Needle type: Spinal  Needle Placement: Articular  Findings:  -Contrast Used: 1 mL iohexol 180 mg iodine/mL   -Comments: Excellent flow of contrast producing a partial arthrogram.  Procedure Details: The fluoroscope beam is vertically oriented in AP, and the inferior recess is visualized beneath the lower pole of the inferior apophyseal process, which represents the target point for needle insertion. When direct visualization is difficult the target point is located at the medial projection of the vertebral pedicle. The region overlying each aforementioned target is locally anesthetized with a 1 to 2 ml. volume of 1% Lidocaine without Epinephrine.   The spinal needle was inserted into each of the above mentioned facet joints using biplanar fluoroscopic guidance. A 0.25 to 0.5 ml. volume of Isovue-250 was injected and a partial facet joint arthrogram was obtained. A single spot film was obtained of the resulting arthrogram.    One to 1.25 ml of the steroid/anesthetic solution was then injected into each of the facet joints noted above.   Additional Comments:  The patient tolerated the procedure well No complications occurred Dressing: Band-Aid    Post-procedure  details: Patient was observed during the procedure. Post-procedure instructions were reviewed.  Patient left the clinic in stable condition.     Clinical History: No specialty comments available.  Andrew Pacheco reports that Andrew Pacheco has never smoked. Andrew Pacheco has never used smokeless tobacco. No results for input(s): HGBA1C, LABURIC in the last 8760 hours.  Objective:  VS:  HT:    WT:   BMI:     BP:(!) 146/89  HR:72bpm  TEMP: ( )  RESP:100 % Physical Exam  Constitutional: Andrew Pacheco is oriented to person, place, and time. Andrew Pacheco appears well-developed and well-nourished. No distress.  HENT:  Head: Normocephalic and atraumatic.  Eyes: Conjunctivae are normal. Pupils are equal, round, and reactive to light.  Neck: Normal range of motion. Neck supple.  Cardiovascular: Regular rhythm and intact distal pulses.   Pulmonary/Chest: Effort normal. No respiratory distress.  Musculoskeletal:  Patient is slow to rise from a seated position. Andrew Pacheco is pain with extension rotation of the lumbar spine. Andrew Pacheco has no pain with hip rotation internal or external. Has good distal strength. Andrew Pacheco has no clonus bilaterally. Andrew Pacheco does have pain across the lumbar spine with palpation of the paraspinal musculature and quadratus lumborum.  Neurological: Andrew Pacheco is alert and oriented to person, place, and time. Andrew Pacheco exhibits normal muscle tone. Coordination normal.  Skin: Skin is warm and dry.  No rash noted. No erythema.  Psychiatric: Andrew Pacheco has a normal mood and affect.  Nursing note and vitals reviewed.   Ortho Exam Imaging: Xr C-arm No Report  Result Date: 05/30/2017 Please see Notes or Procedures tab for imaging impression.   Past Medical/Family/Surgical/Social History: Medications & Allergies reviewed per EMR Patient Active Problem List   Diagnosis Date Noted  . 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  . ANEMIA-UNSPECIFIED 09/26/2009  . CONSTIPATION 09/26/2009  . NAUSEA 09/26/2009  . ABDOMINAL PAIN -GENERALIZED 09/26/2009   Past Medical History:  Diagnosis  Date  . Anemia   . Colon polyps   . Diverticulosis   . GERD (gastroesophageal reflux disease)   . Glaucoma   . H pylori ulcer   . HTN (hypertension)   . 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 Lumbar disc disease   . Seasonal allergies    Family History  Problem Relation Age of Onset  . Heart disease Father   . Emphysema Father   . Diverticulosis Father   . Cancer Mother        gallbladder-mets  . Arthritis Mother   . Liver cancer Mother    Past Surgical History:  Procedure Laterality Date  . APPENDECTOMY    . KNEE SURGERY Right    cyst removed   Social History   Occupational History  . retired    Social History Main Topics  . Smoking status: Never Smoker  . Smokeless tobacco: Never Used  . Alcohol use Yes     Comment: wine twice a year  . Drug use: No  . Sexual activity: Not on file

## 2017-09-08 ENCOUNTER — Encounter (HOSPITAL_COMMUNITY): Payer: Self-pay

## 2017-09-08 ENCOUNTER — Observation Stay (HOSPITAL_COMMUNITY)
Admission: EM | Admit: 2017-09-08 | Discharge: 2017-09-11 | Disposition: A | Discharge status: Home / Self Care | Payer: Medicare Other | Attending: Internal Medicine | Admitting: Internal Medicine

## 2017-09-08 ENCOUNTER — Emergency Department (HOSPITAL_COMMUNITY): Payer: Medicare Other

## 2017-09-08 DIAGNOSIS — Z8673 Personal history of transient ischemic attack (TIA), and cerebral infarction without residual deficits: Secondary | ICD-10-CM | POA: Insufficient documentation

## 2017-09-08 DIAGNOSIS — G8929 Other chronic pain: Secondary | ICD-10-CM | POA: Diagnosis not present

## 2017-09-08 DIAGNOSIS — D649 Anemia, unspecified: Secondary | ICD-10-CM | POA: Diagnosis not present

## 2017-09-08 DIAGNOSIS — R002 Palpitations: Secondary | ICD-10-CM | POA: Insufficient documentation

## 2017-09-08 DIAGNOSIS — G43909 Migraine, unspecified, not intractable, without status migrainosus: Secondary | ICD-10-CM | POA: Diagnosis not present

## 2017-09-08 DIAGNOSIS — R404 Transient alteration of awareness: Secondary | ICD-10-CM

## 2017-09-08 DIAGNOSIS — N179 Acute kidney failure, unspecified: Secondary | ICD-10-CM | POA: Diagnosis present

## 2017-09-08 DIAGNOSIS — M549 Dorsalgia, unspecified: Secondary | ICD-10-CM | POA: Insufficient documentation

## 2017-09-08 DIAGNOSIS — E785 Hyperlipidemia, unspecified: Secondary | ICD-10-CM

## 2017-09-08 DIAGNOSIS — I4510 Unspecified right bundle-branch block: Secondary | ICD-10-CM | POA: Insufficient documentation

## 2017-09-08 DIAGNOSIS — H409 Unspecified glaucoma: Secondary | ICD-10-CM | POA: Diagnosis not present

## 2017-09-08 DIAGNOSIS — I10 Essential (primary) hypertension: Secondary | ICD-10-CM | POA: Diagnosis present

## 2017-09-08 DIAGNOSIS — Z79891 Long term (current) use of opiate analgesic: Secondary | ICD-10-CM | POA: Diagnosis not present

## 2017-09-08 DIAGNOSIS — R55 Syncope and collapse: Principal | ICD-10-CM | POA: Diagnosis present

## 2017-09-08 DIAGNOSIS — G9340 Encephalopathy, unspecified: Secondary | ICD-10-CM | POA: Diagnosis not present

## 2017-09-08 DIAGNOSIS — E440 Moderate protein-calorie malnutrition: Secondary | ICD-10-CM | POA: Insufficient documentation

## 2017-09-08 DIAGNOSIS — Z79899 Other long term (current) drug therapy: Secondary | ICD-10-CM | POA: Diagnosis not present

## 2017-09-08 DIAGNOSIS — R7989 Other specified abnormal findings of blood chemistry: Secondary | ICD-10-CM | POA: Diagnosis present

## 2017-09-08 DIAGNOSIS — R42 Dizziness and giddiness: Secondary | ICD-10-CM | POA: Diagnosis present

## 2017-09-08 DIAGNOSIS — K219 Gastro-esophageal reflux disease without esophagitis: Secondary | ICD-10-CM | POA: Diagnosis present

## 2017-09-08 DIAGNOSIS — E538 Deficiency of other specified B group vitamins: Secondary | ICD-10-CM | POA: Insufficient documentation

## 2017-09-08 HISTORY — DX: Unspecified osteoarthritis, unspecified site: M19.90

## 2017-09-08 HISTORY — DX: Migraine, unspecified, not intractable, without status migrainosus: G43.909

## 2017-09-08 HISTORY — DX: Headache: R51

## 2017-09-08 HISTORY — DX: Unspecified hearing loss, unspecified ear: H91.90

## 2017-09-08 HISTORY — DX: Unspecified glaucoma: H40.9

## 2017-09-08 HISTORY — DX: Other chronic pain: G89.29

## 2017-09-08 HISTORY — DX: Dorsalgia, unspecified: M54.9

## 2017-09-08 HISTORY — DX: Headache, unspecified: R51.9

## 2017-09-08 LAB — BASIC METABOLIC PANEL
Anion gap: 9 (ref 5–15)
BUN: 20 mg/dL (ref 6–20)
CO2: 24 mmol/L (ref 22–32)
Calcium: 9.3 mg/dL (ref 8.9–10.3)
Chloride: 104 mmol/L (ref 101–111)
Creatinine, Ser: 1.41 mg/dL — ABNORMAL HIGH (ref 0.61–1.24)
GFR calc Af Amer: 54 mL/min — ABNORMAL LOW (ref >60)
GFR, EST NON AFRICAN AMERICAN: 46 mL/min — AB (ref >60)
GLUCOSE: 121 mg/dL — AB (ref 65–99)
Potassium: 4.3 mmol/L (ref 3.5–5.1)
SODIUM: 137 mmol/L (ref 135–145)

## 2017-09-08 LAB — URINALYSIS, ROUTINE W REFLEX MICROSCOPIC
BACTERIA UA: NONE SEEN
BILIRUBIN URINE: NEGATIVE
Glucose, UA: NEGATIVE mg/dL
Hgb urine dipstick: NEGATIVE
Ketones, ur: 5 mg/dL — AB
Leukocytes, UA: NEGATIVE
NITRITE: NEGATIVE
PROTEIN: 30 mg/dL — AB
Specific Gravity, Urine: 1.03 (ref 1.005–1.030)
pH: 5 (ref 5.0–8.0)

## 2017-09-08 LAB — CBC
HCT: 35.3 % — ABNORMAL LOW (ref 39.0–52.0)
Hemoglobin: 11.1 g/dL — ABNORMAL LOW (ref 13.0–17.0)
MCH: 29 pg (ref 26.0–34.0)
MCHC: 31.4 g/dL (ref 30.0–36.0)
MCV: 92.2 fL (ref 78.0–100.0)
PLATELETS: 166 10*3/uL (ref 150–400)
RBC: 3.83 MIL/uL — ABNORMAL LOW (ref 4.22–5.81)
RDW: 15.1 % (ref 11.5–15.5)
WBC: 5.7 10*3/uL (ref 4.0–10.5)

## 2017-09-08 LAB — I-STAT TROPONIN, ED: Troponin i, poc: 0 ng/mL (ref 0.00–0.08)

## 2017-09-08 LAB — TSH: TSH: 2.575 u[IU]/mL (ref 0.350–4.500)

## 2017-09-08 MED ORDER — HYDROCODONE-ACETAMINOPHEN 5-325 MG PO TABS
1.0000 | ORAL_TABLET | Freq: Two times a day (BID) | ORAL | Status: DC | PRN
  Administered 2017-09-09 – 2017-09-11 (×6): 1 via ORAL
  Filled 2017-09-08 (×6): qty 1

## 2017-09-08 MED ORDER — ONDANSETRON HCL 4 MG PO TABS: 4.0000 mg | ORAL_TABLET | Freq: Four times a day (QID) | ORAL | Status: DC | PRN

## 2017-09-08 MED ORDER — ENOXAPARIN SODIUM 40 MG/0.4ML ~~LOC~~ SOLN
40.0000 mg | SUBCUTANEOUS | Status: DC
  Administered 2017-09-10 – 2017-09-11 (×2): 40 mg via SUBCUTANEOUS
  Filled 2017-09-08 (×3): qty 0.4

## 2017-09-08 MED ORDER — ACETAMINOPHEN 325 MG PO TABS
650.0000 mg | ORAL_TABLET | Freq: Four times a day (QID) | ORAL | Status: DC | PRN
  Administered 2017-09-09 – 2017-09-10 (×3): 650 mg via ORAL
  Filled 2017-09-08 (×3): qty 2

## 2017-09-08 MED ORDER — IPRATROPIUM BROMIDE 0.06 % NA SOLN
2.0000 | Freq: Every day | NASAL | Status: DC
  Filled 2017-09-08: qty 15

## 2017-09-08 MED ORDER — SODIUM CHLORIDE 0.9% FLUSH
3.0000 mL | Freq: Two times a day (BID) | INTRAVENOUS | Status: DC
  Administered 2017-09-09 – 2017-09-11 (×3): 3 mL via INTRAVENOUS

## 2017-09-08 MED ORDER — ONDANSETRON HCL 4 MG/2ML IJ SOLN
4.0000 mg | Freq: Four times a day (QID) | INTRAMUSCULAR | Status: DC | PRN
  Administered 2017-09-09: 4 mg via INTRAVENOUS
  Filled 2017-09-08: qty 2

## 2017-09-08 MED ORDER — BISACODYL 10 MG RE SUPP: 10.0000 mg | RECTAL | Status: DC | PRN

## 2017-09-08 MED ORDER — MECLIZINE HCL 25 MG PO TABS
25.0000 mg | ORAL_TABLET | Freq: Once | ORAL | Status: AC
  Administered 2017-09-08: 25 mg via ORAL
  Filled 2017-09-08: qty 1

## 2017-09-08 MED ORDER — MORPHINE SULFATE (PF) 4 MG/ML IV SOLN
2.0000 mg | INTRAVENOUS | Status: DC | PRN
  Administered 2017-09-09: 2 mg via INTRAVENOUS
  Filled 2017-09-08: qty 1

## 2017-09-08 MED ORDER — LACTATED RINGERS IV SOLN
INTRAVENOUS | Status: DC
  Administered 2017-09-09: 02:00:00 via INTRAVENOUS
  Administered 2017-09-09: 1000 mL via INTRAVENOUS
  Administered 2017-09-10 (×2): via INTRAVENOUS

## 2017-09-08 MED ORDER — TRAVOPROST (BAK FREE) 0.004 % OP SOLN
1.0000 [drp] | Freq: Every day | OPHTHALMIC | Status: DC
  Administered 2017-09-09 – 2017-09-10 (×2): 1 [drp] via OPHTHALMIC
  Filled 2017-09-08: qty 2.5

## 2017-09-08 MED ORDER — ACETAMINOPHEN 650 MG RE SUPP
650.0000 mg | Freq: Four times a day (QID) | RECTAL | Status: DC | PRN
Start: 2017-09-08 — End: 2017-09-11

## 2017-09-08 NOTE — ED Notes (Signed)
Urinal at bedside, pt attempting to provide sample

## 2017-09-08 NOTE — ED Provider Notes (Signed)
[facility] DEPT Provider Note   CSN: [id number] Arrival date & time: 09/08/17  1831     History   Chief Complaint Chief Complaint  Patient presents with  . Altered Mental Status    HPI Andrew Pacheco is a 76 y.o. male.  This is a 76 year old male with PMH of HTN, presbyopia, lumbar disc disease on home hydrocodone who presents with altered mental status from work.  Patient works as a Presenter, broadcasting and he suddenly felt very lightheaded and dizzy requiring him to sit down.  EMS arrived measured his glucose at 130, they noted he was slow to respond to questions but no slurred speech or facial droop or focal deficits noted. He was alert and oreinted however. He endorses small amount of left lateral chest pain, denies shortness of breath, visual disturbances, headaches, recent falls, recent travel. Denies any numbness or tingling in his extremities, denies nausea, vomiting, decreased strength. No history of stroke, heart attack. Recently placed on Zio-patch for repeated episodes of hypotension.   The history is provided by the patient and a relative.   Past Medical History:  Diagnosis Date  . Anemia   . Colon polyps   . Diverticulosis   . GERD (gastroesophageal reflux disease)   . Glaucoma   . H pylori ulcer   . Hard of hearing   . HTN (hypertension)   . 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 Lumbar disc disease   . Seasonal allergies     Patient Active Problem List   Diagnosis Date Noted  . Near syncope 09/08/2017  . 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  . ANEMIA-UNSPECIFIED 09/26/2009  . CONSTIPATION 09/26/2009  . NAUSEA 09/26/2009  . ABDOMINAL PAIN -GENERALIZED 09/26/2009    Past Surgical History:  Procedure Laterality Date  . APPENDECTOMY    . KNEE SURGERY Right    cyst removed       Home Medications    Prior to Admission medications   Medication Sig Start Date End Date Taking? Authorizing Provider  Aspirin-Salicylamide-Caffeine (BC HEADACHE POWDER PO) Take 1 Package by  mouth 2 (two) times daily.   Yes [provider]  bisacodyl (DULCOLAX) 10 MG suppository Place 10 mg rectally as needed for moderate constipation.   Yes [provider]  HYDROcodone-acetaminophen (NORCO/VICODIN) 5-325 MG tablet Take 1 tablet by mouth 2 (two) times daily.  09/15/16  Yes [provider]  ipratropium (ATROVENT) 0.06 % nasal spray Place 2 sprays into both nostrils daily.    Yes [provider]  omeprazole (PRILOSEC) 20 MG capsule TAKE 20MG  BY MOUTH AS NEEDED 06/01/11  Yes [name], MD  Phenylephrine-Cocoa Butter (PREPARATION H) 0.25-88.44 % SUPP Place rectally as needed.   Yes [provider]  senna (SENOKOT) 8.6 MG TABS tablet Take 2 tablets by mouth every other day.   Yes [provider]  travoprost, benzalkonium, (TRAVATAN) 0.004 % ophthalmic solution Place 1 drop into both eyes at bedtime.   Yes [provider]  zolpidem (AMBIEN) 10 MG tablet Take 10 mg by mouth at bedtime.    Yes [provider]    Family History Family History  Problem Relation Age of Onset  . Heart disease Father   . Emphysema Father   . Diverticulosis Father   . Cancer Mother        gallbladder-mets  . Arthritis Mother   . Liver cancer Mother     Social History Social History  Substance Use Topics  . Smoking status: Never Smoker  .  Smokeless tobacco: Never Used  . Alcohol use Yes     Comment: wine twice a year     Allergies   Patient has no known allergies.   Review of Systems Review of Systems  Constitutional: Negative for chills, diaphoresis and fever.  HENT: Negative for ear pain and sore throat.   Eyes: Negative for pain and visual disturbance.  Respiratory: Negative for cough, shortness of breath and wheezing.   Cardiovascular: Negative for chest pain, palpitations and leg swelling.  Gastrointestinal: Negative for abdominal pain, constipation, diarrhea, nausea and vomiting.  Genitourinary: Negative for  dysuria and hematuria.  Musculoskeletal: Negative for arthralgias, back pain and gait problem.  Skin: Negative for color change and rash.  Neurological: Positive for dizziness and light-headedness. Negative for tremors, seizures, syncope, facial asymmetry, speech difficulty, weakness, numbness and headaches.  All other systems reviewed and are negative.    Physical Exam Updated Vital Signs BP (!) 145/73   Pulse (!) 57   Temp 97.8 F (36.6 C)   Resp 14   SpO2 97%   Physical Exam  Constitutional: He is oriented to person, place, and time. He appears well-developed and well-nourished.  HENT:  Head: Normocephalic and atraumatic.  Eyes: Pupils are equal, round, and reactive to light. Conjunctivae and EOM are normal. Right conjunctiva is not injected. Left conjunctiva is not injected. Right eye exhibits no nystagmus. Left eye exhibits no nystagmus.  Neck: Neck supple.  Cardiovascular: Normal rate and regular rhythm.   No murmur heard. Pulmonary/Chest: Effort normal and breath sounds normal. No respiratory distress.  Abdominal: Soft. There is no tenderness.  Musculoskeletal: He exhibits no edema.  Neurological: He is alert and oriented to person, place, and time. He has normal strength. He displays no tremor. No cranial nerve deficit or sensory deficit. He exhibits normal muscle tone. He displays no seizure activity. Coordination and gait normal.  No pronator drift noted. No focal deficits.  Skin: Skin is warm and dry.  Nursing note and vitals reviewed.  ED Treatments / Results  Labs (all labs ordered are listed, but only abnormal results are displayed) Labs Reviewed  CBC - Abnormal; Notable for the following:       Result Value   RBC 3.83 (*)    Hemoglobin 11.1 (*)    HCT 35.3 (*)    All other components within normal limits  BASIC METABOLIC PANEL - Abnormal; Notable for the following:    Glucose, Bld 121 (*)    Creatinine, Ser 1.41 (*)    GFR calc non Af Amer 46 (*)    GFR  calc Af Amer 54 (*)    All other components within normal limits  URINALYSIS, ROUTINE W REFLEX MICROSCOPIC - Abnormal; Notable for the following:    Color, Urine AMBER (*)    Ketones, ur 5 (*)    Protein, ur 30 (*)    Squamous Epithelial / LPF 0-5 (*)    All other components within normal limits  TSH  I-STAT TROPONIN, ED    EKG  EKG Interpretation  Date/Time:  Thursday September 08 2017 18:43:05 EDT Ventricular Rate:  58 PR Interval:    QRS Duration: 157 QT Interval:  452 QTC Calculation: 444 R Axis:   58 Text Interpretation:  Sinus rhythm IVCD, consider atypical RBBB No previous ECGs available Confirmed by Theotis Burrow [phone]) on 09/08/2017 7:04:46 PM       Radiology Dg Chest 2 View  Result Date: 09/08/2017 CLINICAL DATA:  Altered mental status. EXAM: CHEST  2 VIEW COMPARISON:  None. FINDINGS: The heart size and mediastinal contours are within normal limits. Both lungs are clear. No pneumothorax or pleural effusion is noted. The visualized skeletal structures are unremarkable. IMPRESSION: No active cardiopulmonary disease. Electronically Signed   By: Marijo Conception, M.D.   On: 09/08/2017 19:46   Ct Head Wo Contrast  Result Date: 09/08/2017 CLINICAL DATA:  Altered level of consciousness EXAM: CT HEAD WITHOUT CONTRAST TECHNIQUE: Contiguous axial images were obtained from the base of the skull through the vertex without intravenous contrast. COMPARISON:  None. FINDINGS: Brain: No acute territorial infarction, hemorrhage or intracranial mass is seen. Mild white matter hypodensity consistent with small vessel ischemic change. Ventricles are nonenlarged. Mild atrophy. Vascular: No hyperdense vessels. Vertebral and carotid artery calcification. Skull: No fracture or suspicious bone lesion Sinuses/Orbits: Mild mucosal thickening in the ethmoid sinuses. No acute orbital abnormality Other: None IMPRESSION: No CT evidence for acute intracranial abnormality. Atrophy and mild small vessel  ischemic changes of the white matter. Electronically Signed   By: Donavan Foil M.D.   On: 09/08/2017 19:29    Procedures Procedures (including critical care time)  Medications Ordered in ED Medications  meclizine (ANTIVERT) tablet 25 mg (25 mg Oral Given 09/08/17 2001)     Initial Impression / Assessment and Plan / ED Course  I have reviewed the triage vital signs and the nursing notes.  Pertinent labs & imaging results that were available during my care of the patient were reviewed by me and considered in my medical decision making (see chart for details).     This is a 76 year old male with PMH of HTN, presbyopia, lumbar disc disease on home hydrocodone who presents with altered mental status from work.   I was present at bedside on patient arrival and performed a bedside neurological exam.  Neuro exam as above along with no nystagmus, no slurred speech, no facial droop. No acute indication for Code Stroke on my initial assessment. Patient would also not meet indication for tPA given neurological exam.  CT head ordered along with CBC, CMP, EKG, Troponin.  Chest x-ray negative for any acute cardio pulmonary disease, CT head does not show any evidence of acute intracranial abnormality.  Initial troponin negative, electrolytes within normal range, no leukocytosis and stable hemoglobin compared to 2 years prior.  Patient's dizziness improved with meclizine. Patient reports improvement with dizziness, worse with positional changes however given patient's confusion and near syncopal episode, admission discussed with hospitalist team for neurological or cardiac cause given patient's otherwise previously healthy history.  Family updated at bedside, all questions answered.  Final Clinical Impressions(s) / ED Diagnoses   Final diagnoses:  Transient alteration of awareness  Vertigo    New Prescriptions New Prescriptions   No medications on file     Aldona Lento, MD 09/08/17  2321    Little, Wenda Overland, MD 09/09/17 [phone]

## 2017-09-08 NOTE — ED Triage Notes (Signed)
Per [facility]: Pt was sitting at work and suddenly "didn't feel right" Pt reported feeling nauseated, dizzy, confusion, pale, fatigued, and diaphoretic. Pt did not have chest pain at that time but started complaining of chest pain in the ambulance. t rated ain 4/10. Pts stroke screen with EMS was negative, however pt is slow to respond to questions and is confused, he cannot tell EMS why he has a heart monitor on and is having difficulty recalling certain information. Pts CBG was 130.

## 2017-09-08 NOTE — ED Notes (Signed)
Per Dr. Rex Kras, not activating code stroke.

## 2017-09-08 NOTE — H&P (Signed)
History and Physical    Andrew Pacheco FTD:[id number] DOB: 10-15-1941 DOA: 09/08/2017  PCP: [name], MD Consultants:  Einar Gip - cardiology; [facility] - [place]; Elsner - neurosurgery Patient coming from:  Home - lives with wife NOK: wife  Chief Complaint: near syncope  HPI: Andrew Pacheco is a 76 y.o. male with medical history significant of HTN, glaucoma, and GERD presenting with a near syncopal episode.  Tonight at work, he was sitting in a chair and doing nothing - so long as there is no emergency, then he has nothing to do at work.  He developed acute onset of lightheadedness, his heart skipped a few beats, and then he felt very nauseated.  He developed a sensation of pending diarrhea.  Then he felt cold sweats and diaphoresis.  He was unable to focus or do anything.  He told someone that there was a problem and the on-call nurse came to check him.  He went to the bathroom and he had a lot of gas without diarrhea.  The nausea resolved but he was still disoriented, had difficulty identifying familiar people, difficulty speaking, difficulty answering simple questions.  They called 911.  He has been wearing a BodyGuardian Heart Monitor for the last 2 weeks (out of 4) - heart seems to skip a beat periodically and during those times he feels completely exhausted.  He currently has a headache over his right eye.  He does have chronic back pain.  Takes Hydrocodone 1-2 times daily.  Takes BC powders 1-2 times daily.  He has been easily weak in the last 6 weeks and his activities have been limited by his heart seeming to flutter or skip.  Chronic pain from right testicle to waist - plan for pelvic CT by urology next week.  Also passing some blood in his urine frequently.  ED Course: CXR negative.  Troponin negative and labs stable.  Dizziness improved with meclizine.  Review of Systems: As per HPI; otherwise review of systems reviewed and negative.   Ambulatory Status:  Ambulates  without assistance  Past Medical History:  Diagnosis Date  . Anemia   . Colon polyps   . Diverticulosis   . GERD (gastroesophageal reflux disease)   . Glaucoma   . H pylori ulcer   . Hard of hearing   . HTN (hypertension)   . 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 Lumbar disc disease   . Seasonal allergies     Past Surgical History:  Procedure Laterality Date  . APPENDECTOMY    . KNEE SURGERY Right    cyst removed    Social History   Social History  . Marital status: Married    Spouse name: N/A  . Number of children: 2  . Years of education: N/A   Occupational History  . security guard - [employer]    Social History Main Topics  . Smoking status: Never Smoker  . Smokeless tobacco: Never Used  . Alcohol use Yes     Comment: wine twice a year  . Drug use: No  . Sexual activity: Not on file   Other Topics Concern  . Not on file   Social History Narrative  . No narrative on file    No Known Allergies  Family History  Problem Relation Age of Onset  . Heart disease Father   . Emphysema Father   . Diverticulosis Father   . Cancer Mother        gallbladder-mets  .  Arthritis Mother   . Liver cancer Mother     Prior to Admission medications   Medication Sig Start Date End Date Taking? Authorizing Provider  Aspirin-Salicylamide-Caffeine (BC HEADACHE POWDER PO) Take 1 Package by mouth 2 (two) times daily.   Yes [provider]  bisacodyl (DULCOLAX) 10 MG suppository Place 10 mg rectally as needed for moderate constipation.   Yes [provider]  HYDROcodone-acetaminophen (NORCO/VICODIN) 5-325 MG tablet Take 1 tablet by mouth 2 (two) times daily.  09/15/16  Yes [provider]  ipratropium (ATROVENT) 0.06 % nasal spray Place 2 sprays into both nostrils daily.    Yes [provider]  omeprazole (PRILOSEC) 20 MG capsule TAKE 20MG  BY MOUTH AS NEEDED 06/01/11  Yes [name], MD  Phenylephrine-Cocoa Butter (PREPARATION H)  0.25-88.44 % SUPP Place rectally as needed.   Yes [provider]  senna (SENOKOT) 8.6 MG TABS tablet Take 2 tablets by mouth every other day.   Yes [provider]  travoprost, benzalkonium, (TRAVATAN) 0.004 % ophthalmic solution Place 1 drop into both eyes at bedtime.   Yes [provider]  zolpidem (AMBIEN) 10 MG tablet Take 10 mg by mouth at bedtime.    Yes [provider]    Physical Exam: Vitals:   09/08/17 2145 09/08/17 2200 09/08/17 2215 09/08/17 2315  BP: 132/82 (!) 144/76 (!) 145/73 137/67  Pulse: (!) 57 63 (!) 57 (!) 55  Resp: 19 19 14 15   Temp:      SpO2: 99% 100% 97% 99%     General:  Appears calm and comfortable and is NAD Eyes:  PERRL, EOMI, normal lids, iris ENT:  grossly normal hearing, lips & tongue, mmm Neck:  no LAD, masses or thyromegaly; no carotid bruits Cardiovascular:  RRR, no m/r/g. No LE edema.  BodyGuardian device taped onto left chest. Respiratory:   CTA bilaterally with no wheezes/rales/rhonchi.  Normal respiratory effort. Abdomen:  soft, NT, ND, NABS Back:   normal alignment, no CVAT Skin:  no rash or induration seen on limited exam Musculoskeletal:  grossly normal tone BUE/BLE, good ROM, no bony abnormality Lower extremity:  No LE edema.  Limited foot exam with no ulcerations.  2+ distal pulses. Psychiatric:  grossly normal mood and affect, speech fluent and appropriate, AOx3 Neurologic:  CN 2-12 grossly intact, moves all extremities in coordinated fashion, sensation intact    Radiological Exams on Admission: Dg Chest 2 View  Result Date: 09/08/2017 CLINICAL DATA:  Altered mental status. EXAM: CHEST  2 VIEW COMPARISON:  None. FINDINGS: The heart size and mediastinal contours are within normal limits. Both lungs are clear. No pneumothorax or pleural effusion is noted. The visualized skeletal structures are unremarkable. IMPRESSION: No active cardiopulmonary disease. Electronically Signed   By: Marijo Conception, M.D.    On: 09/08/2017 19:46   Ct Head Wo Contrast  Result Date: 09/08/2017 CLINICAL DATA:  Altered level of consciousness EXAM: CT HEAD WITHOUT CONTRAST TECHNIQUE: Contiguous axial images were obtained from the base of the skull through the vertex without intravenous contrast. COMPARISON:  None. FINDINGS: Brain: No acute territorial infarction, hemorrhage or intracranial mass is seen. Mild white matter hypodensity consistent with small vessel ischemic change. Ventricles are nonenlarged. Mild atrophy. Vascular: No hyperdense vessels. Vertebral and carotid artery calcification. Skull: No fracture or suspicious bone lesion Sinuses/Orbits: Mild mucosal thickening in the ethmoid sinuses. No acute orbital abnormality Other: None IMPRESSION: No CT evidence for acute intracranial abnormality. Atrophy and mild small vessel ischemic changes  of the white matter. Electronically Signed   By: Donavan Foil M.D.   On: 09/08/2017 19:29    EKG: Independently reviewed.  NSR with rate 58; IVCD, partial RBBB with no evidence of acute ischemia   Labs on Admission: I have personally reviewed the available labs and imaging studies at the time of the admission.  Pertinent labs:   UA: Ketones 5, 30 protein Troponin 0.00 Glucose 121 BUN 20/Creatinine 1.41/GFR 46 Hgb 11.1; 11.6 in 2015 TSH 2.575  Assessment/Plan Principal Problem:   Near syncope   -Patient is generally fairly healthy, still works part-time as a Presenter, broadcasting -Acute onset of near syncope tonight while at work -He has been followed recently by Dr. Einar Gip for a possible arrhythmia and certainly this could be related to his presenting complaints -He is wearing a BodyGuardian monitor and has been inputting information about his symptoms into his smart phone and so obtaining a telemetry record during his symptoms would be extremely helpful at determining if any arrhythmia is the source of his symptoms -By the [facility] syncope rule, the patient is at  moderate/high risk for serious outcome and thus should be observed in the hospital.  This is based on his abnormal EKG, but there is none available for comparison and so it is possible that it is unchanged from prior. -Will observe overnight -Will monitor on telemetry -Orthostatic vital signs in AM -Troponin negative x 1  -Cardiology consult in AM - [employer] request placed -Neuro checks  -Normal TSH -LR at 75 cc/hr since his creatinine could be AKI vs. CKD (none available for comparison) -If no concern for cardiac cause, could consider further evaluation for CVA/TIA but his symptoms are less consistent with this currently and so no further evaluation at this time with negative head CT   DVT prophylaxis: Lovenox Code Status:  Full - confirmed with patient/family Family Communication: Wife present throughout evaluation  Disposition Plan: Home once clinically improved Consults called: Cardiology - consult placed via [employer]Master message  Admission status: It is my clinical opinion that referral for OBSERVATION is reasonable and necessary in this patient based on the above information provided. The aforementioned taken together are felt to place the patient at high risk for further clinical deterioration. However it is anticipated that the patient may be medically stable for discharge from the hospital within 24 to 48 hours.     Karmen Bongo MD [facility]  If note is complete, please contact covering daytime or nighttime physician. [email or web address] Password [email or web address]  09/08/2017, 11:45 PM

## 2017-09-08 NOTE — ED Notes (Signed)
Dr. Little at bedside.  

## 2017-09-09 ENCOUNTER — Encounter (HOSPITAL_COMMUNITY): Payer: Self-pay | Admitting: Radiology

## 2017-09-09 ENCOUNTER — Observation Stay (HOSPITAL_COMMUNITY): Payer: Medicare Other

## 2017-09-09 DIAGNOSIS — I10 Essential (primary) hypertension: Secondary | ICD-10-CM | POA: Diagnosis not present

## 2017-09-09 DIAGNOSIS — H409 Unspecified glaucoma: Secondary | ICD-10-CM

## 2017-09-09 DIAGNOSIS — N179 Acute kidney failure, unspecified: Secondary | ICD-10-CM | POA: Diagnosis not present

## 2017-09-09 DIAGNOSIS — K219 Gastro-esophageal reflux disease without esophagitis: Secondary | ICD-10-CM

## 2017-09-09 DIAGNOSIS — E538 Deficiency of other specified B group vitamins: Secondary | ICD-10-CM | POA: Diagnosis not present

## 2017-09-09 DIAGNOSIS — D649 Anemia, unspecified: Secondary | ICD-10-CM

## 2017-09-09 LAB — BASIC METABOLIC PANEL
ANION GAP: 8 (ref 5–15)
BUN: 16 mg/dL (ref 6–20)
CALCIUM: 9.5 mg/dL (ref 8.9–10.3)
CO2: 26 mmol/L (ref 22–32)
CREATININE: 1.13 mg/dL (ref 0.61–1.24)
Chloride: 105 mmol/L (ref 101–111)
GLUCOSE: 88 mg/dL (ref 65–99)
Potassium: 4.8 mmol/L (ref 3.5–5.1)
Sodium: 139 mmol/L (ref 135–145)

## 2017-09-09 LAB — CBC
HCT: 34.5 % — ABNORMAL LOW (ref 39.0–52.0)
HEMOGLOBIN: 11.1 g/dL — AB (ref 13.0–17.0)
MCH: 29.4 pg (ref 26.0–34.0)
MCHC: 32.2 g/dL (ref 30.0–36.0)
MCV: 91.5 fL (ref 78.0–100.0)
PLATELETS: 139 10*3/uL — AB (ref 150–400)
RBC: 3.77 MIL/uL — ABNORMAL LOW (ref 4.22–5.81)
RDW: 14.9 % (ref 11.5–15.5)
WBC: 4.6 10*3/uL (ref 4.0–10.5)

## 2017-09-09 LAB — VITAMIN B12: VITAMIN B 12: 235 pg/mL (ref 180–914)

## 2017-09-09 LAB — TSH: TSH: 0.969 u[IU]/mL (ref 0.350–4.500)

## 2017-09-09 MED ORDER — ENSURE ENLIVE PO LIQD
237.0000 mL | Freq: Two times a day (BID) | ORAL | Status: DC
  Administered 2017-09-10 – 2017-09-11 (×4): 237 mL via ORAL

## 2017-09-09 MED ORDER — BISACODYL 10 MG RE SUPP: 10.0000 mg | Freq: Every day | RECTAL | Status: DC | PRN

## 2017-09-09 MED ORDER — ZOLPIDEM TARTRATE 5 MG PO TABS
5.0000 mg | ORAL_TABLET | Freq: Once | ORAL | Status: AC
  Administered 2017-09-09: 5 mg via ORAL
  Filled 2017-09-09: qty 1

## 2017-09-09 MED ORDER — CYANOCOBALAMIN 1000 MCG/ML IJ SOLN
1000.0000 ug | Freq: Every day | INTRAMUSCULAR | Status: DC
  Administered 2017-09-10 – 2017-09-11 (×2): 1000 ug via INTRAMUSCULAR
  Filled 2017-09-09 (×3): qty 1

## 2017-09-09 NOTE — Progress Notes (Signed)
PROGRESS NOTE    Andrew Pacheco  XTG:[id number] DOB: 02/26/1941 DOA: 09/08/2017 PCP: [name], MD   Brief Narrative:  Patient 76 year old gentleman history of hypertension,,, gastroesophageal reflux disease presented with a near syncopal episode while at work with development of lightheadedness, skipped heartbeats, nausea, sensation of pending diarrhea, inability to focus, slow mentation.   Assessment & Plan:   Principal Problem:   Near syncope Active Problems:   Gastroesophageal reflux disease   AKI (acute kidney injury) ([place])   Glaucoma  #1 near syncope Patient represented with a near syncopal episode with associated palpitations prior to symptom onset, some memory issues, lightheadedness, nausea. Concern for cardiogenic etiology as patient with palpitations prior to symptom onset. Patient already had an event monitor placed on 08/24/2017 at his cardiologist's office however inflammation from event monitor at cardiologist's office. Patient also noted to have some slow mental status and a such concerns for neurological etiology. Head CT done was negative for any acute abnormalities however did note a remote thalamic infarct. MRI of the head has been ordered and done and negative for any acute intracranial abnormalities however does show a subcentimeter remote right thalamic lacunar infarct. Mild age-related cerebral atrophy with chronic small vessel ischemic disease. Orthostatics was not checked on admission. Orthostatics checked this morning with some mild orthostasis by pulse. Patient however on IV fluids. Carotid Dopplers have been ordered and are pending per cardiology. Check a 2-D echo. Check RPR, RBC folate, vitamin B-12, HIV. Continue hydration with IV fluids. Cardiology following the patient input and recommendations.  #2 acute kidney injury Likely secondary to prerenal azotemia. Renal function improving with hydration. Follow.  #3 history of anemia Stable.  #4  gastroesophageal reflux disease PPI.  #5 glaucoma Continue eyedrops.     DVT prophylaxis: Lovenox Code Status: Full Family Communication: Updated patient and wife at bedside. Disposition Plan: Likely home once workup has been completed.   Consultants:   Cardiology: Dr.Ganji 09/09/2017  Procedures:   CT head 09/08/2017  MRI head 09/09/2017  Chest x-ray 09/08/2017  Antimicrobials:   None   Subjective: Patient laying on gurney in the hallway in the ED states some improvement with his symptoms. Patient denies any further right lower extremity weakness. Patient notes some fogginess in his mentation.  Objective: Vitals:   09/09/17 1100 09/09/17 1200 09/09/17 1405 09/09/17 1608  BP: (!) 110/55 138/87 (!) 159/82 (!) 142/73  Pulse: 71 73 68 66  Resp: 14 19 14 17   Temp:    97.7 F (36.5 C)  TempSrc:    Oral  SpO2: 94% 97% 97% 98%  Weight:    70.4 kg (155 lb 3.3 oz)  Height:    6\' 2"  (1.88 m)    Intake/Output Summary (Last 24 hours) at 09/09/17 1855 Last data filed at 09/09/17 1809  Gross per 24 hour  Intake              120 ml  Output             1450 ml  Net            -1330 ml   Filed Weights   09/09/17 1608  Weight: 70.4 kg (155 lb 3.3 oz)    Examination:  General exam: Appears calm and comfortable  Respiratory system: Clear to auscultation. No wheezes, no crackles, no rhonchi. Respiratory effort normal. Cardiovascular system: S1 & S2 heard, RRR. No JVD, murmurs, rubs, gallops or clicks. No pedal edema. Gastrointestinal system: Abdomen is nondistended, soft and nontender.  No organomegaly or masses felt. Normal bowel sounds heard. Central nervous system: Alert and oriented. No focal neurological deficits. Extremities: Symmetric 5 x 5 power. Skin: No rashes, lesions or ulcers Psychiatry: Judgement and insight appear normal. Mood & affect appropriate.     Data Reviewed: I have personally reviewed following labs and imaging studies  CBC:  Recent  Labs Lab 09/08/17 1840 09/09/17 0212  WBC 5.7 4.6  HGB 11.1* 11.1*  HCT 35.3* 34.5*  MCV 92.2 91.5  PLT 166 409*   Basic Metabolic Panel:  Recent Labs Lab 09/08/17 1840 09/09/17 0212  NA 137 139  K 4.3 4.8  CL 104 105  CO2 24 26  GLUCOSE 121* 88  BUN 20 16  CREATININE 1.41* 1.13  CALCIUM 9.3 9.5   GFR: Estimated Creatinine Clearance: 53.6 mL/min (by C-G formula based on SCr of 1.13 mg/dL). Liver Function Tests: No results for input(s): AST, ALT, ALKPHOS, BILITOT, PROT, ALBUMIN in the last 168 hours. No results for input(s): LIPASE, AMYLASE in the last 168 hours. No results for input(s): AMMONIA in the last 168 hours. Coagulation Profile: No results for input(s): INR, PROTIME in the last 168 hours. Cardiac Enzymes: No results for input(s): CKTOTAL, CKMB, CKMBINDEX, TROPONINI in the last 168 hours. BNP (last 3 results) No results for input(s): PROBNP in the last 8760 hours. HbA1C: No results for input(s): HGBA1C in the last 72 hours. CBG: No results for input(s): GLUCAP in the last 168 hours. Lipid Profile: No results for input(s): CHOL, HDL, LDLCALC, TRIG, CHOLHDL, LDLDIRECT in the last 72 hours. Thyroid Function Tests:  Recent Labs  09/09/17 1557  TSH 0.969   Anemia Panel:  Recent Labs  09/09/17 1557  VITAMINB12 235   Sepsis Labs: No results for input(s): PROCALCITON, LATICACIDVEN in the last 168 hours.  No results found for this or any previous visit (from the past 240 hour(s)).       Radiology Studies: Dg Chest 2 View  Result Date: 09/08/2017 CLINICAL DATA:  Altered mental status. EXAM: CHEST  2 VIEW COMPARISON:  None. FINDINGS: The heart size and mediastinal contours are within normal limits. Both lungs are clear. No pneumothorax or pleural effusion is noted. The visualized skeletal structures are unremarkable. IMPRESSION: No active cardiopulmonary disease. Electronically Signed   By: Marijo Conception, M.D.   On: 09/08/2017 19:46   Ct Head Wo  Contrast  Result Date: 09/08/2017 CLINICAL DATA:  Altered level of consciousness EXAM: CT HEAD WITHOUT CONTRAST TECHNIQUE: Contiguous axial images were obtained from the base of the skull through the vertex without intravenous contrast. COMPARISON:  None. FINDINGS: Brain: No acute territorial infarction, hemorrhage or intracranial mass is seen. Mild white matter hypodensity consistent with small vessel ischemic change. Ventricles are nonenlarged. Mild atrophy. Vascular: No hyperdense vessels. Vertebral and carotid artery calcification. Skull: No fracture or suspicious bone lesion Sinuses/Orbits: Mild mucosal thickening in the ethmoid sinuses. No acute orbital abnormality Other: None IMPRESSION: No CT evidence for acute intracranial abnormality. Atrophy and mild small vessel ischemic changes of the white matter. Electronically Signed   By: Donavan Foil M.D.   On: 09/08/2017 19:29   Mr Brain Wo Contrast  Result Date: 09/09/2017 CLINICAL DATA:  Initial evaluation for acute altered mental status, near syncope. EXAM: MRI HEAD WITHOUT CONTRAST TECHNIQUE: Multiplanar, multiecho pulse sequences of the brain and surrounding structures were obtained without intravenous contrast. COMPARISON:  [facility] CT from 09/08/2017. FINDINGS: Brain: Generalized age related cerebral atrophy. Mild chronic microvascular ischemic disease seen within the  supratentorial white matter. Subcentimeter remote right thalamic lacunar infarct. No abnormal foci of restricted diffusion to suggest acute or subacute ischemia. Gray-white matter differentiation maintained. No other evidence for chronic infarction. No susceptibility artifact to suggest acute or chronic intracranial hemorrhage. No mass lesion, midline shift or mass effect. No hydrocephalus. No extra-axial fluid collection. Major dural sinuses are patent. Pituitary gland suprasellar region within normal limits. Midline structures intact and normal. Vascular: Major intracranial vascular  flow voids well maintained. Skull and upper cervical spine: Craniocervical junction normal. Upper cervical spine within normal limits. Bone marrow signal intensity normal. No scalp soft tissue abnormality. Sinuses/Orbits: Globes oral soft tissues within normal limits. Paranasal sinuses are clear. No mastoid effusion. Inner ear structures normal. Other: None. IMPRESSION: 1. No acute intracranial abnormality. 2. Subcentimeter remote right thalamic lacunar infarct. 3. Mild age-related cerebral atrophy with chronic small vessel ischemic disease. Electronically Signed   By: Jeannine Boga M.D.   On: 09/09/2017 13:43        Scheduled Meds: . enoxaparin (LOVENOX) injection  40 mg Subcutaneous Q24H  . ipratropium  2 spray Each Nare Daily  . sodium chloride flush  3 mL Intravenous Q12H  . Travoprost (BAK Free)  1 drop Both Eyes QHS   Continuous Infusions: . lactated ringers 1,000 mL (09/09/17 1809)     LOS: 0 days    Time spent: 27 minutes    THOMPSON,DANIEL, MD [facility] Pager [phone]  If 7PM-7AM, please contact night-coverage [email or web address] Password [facility] 09/09/2017, 6:55 PM

## 2017-09-09 NOTE — Progress Notes (Signed)
Patient trasfered from ED to 5W25 via bed; alert and oriented x 4; complaining of headache; IV saline locked in LAC; skin intact. Orient patient to room and unit; watch safety video; gave patient care guide; instructed how to use the call bell and  fall risk precautions. Will continue to monitor the patient.

## 2017-09-09 NOTE — ED Notes (Signed)
Patient transported to MRI 

## 2017-09-09 NOTE — ED Notes (Signed)
Admitting at bedside 

## 2017-09-09 NOTE — Consult Note (Signed)
CARDIOLOGY CONSULT NOTE  Patient ID: Andrew Pacheco [name]: [id number] DOB/AGE: [birth year] 78 y.o.  Admit date: 09/08/2017 Referring Physician  Wonda Olds, MD Primary Physician:  [name], MD Reason for Consultation  DIzziness  HPI: Andrew Pacheco  is a 76 y.o. male  With Fairly active Caucasian male who I had first seen on 08/24/2017 when he presented with palpitations associated with marked generalized weakness and dizziness that started about a month ago.  He was in the workplace yesterday, he usually answers telephones at the desk, he again started feeling similar episode coming with palpitations, skipped beats, marked generalized weakness and dizziness mouth out that he is known to pass out. He ate his head on the table, the attendants who saw him activated the EMS and sent him to the emergency room. Patient states that was very foggy all day yesterday since the event, this morning he feels much better but still feels foggy and slow with regard to his mental status. States that his physical strength is back to normal but is concerned about slow speech and slow thought. Otherwise no other specific symptoms.  When I had last seen in the office on 08/24/2017, I placed him on an event monitor which she has activated at 5:30 yesterday and I do not have the transmissions to review.  His past medical history is significant for spinal stenosis, back pain, migraine headaches, right bundle branch block, hyperlipidemia, depression and mild chronic anemia.  Past Medical History:  Diagnosis Date  . Anemia   . Colon polyps   . Diverticulosis   . GERD (gastroesophageal reflux disease)   . Glaucoma   . H pylori ulcer   . Hard of hearing   . HTN (hypertension)   . 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 Lumbar disc disease   . Seasonal allergies      Past Surgical History:  Procedure Laterality Date  . APPENDECTOMY    . KNEE SURGERY Right    cyst removed     Family History  Problem Relation Age of  Onset  . Heart disease Father   . Emphysema Father   . Diverticulosis Father   . Cancer Mother        gallbladder-mets  . Arthritis Mother   . Liver cancer Mother      Social History: Social History   Social History  . Marital status: Married    Spouse name: N/A  . Number of children: 2  . Years of education: N/A   Occupational History  . security guard - [employer]    Social History Main Topics  . Smoking status: Never Smoker  . Smokeless tobacco: Never Used  . Alcohol use Yes     Comment: wine twice a year  . Drug use: No  . Sexual activity: Not on file   Other Topics Concern  . Not on file   Social History Narrative  . No narrative on file    Review of Systems - Dizziness and fatigue, decreased alertness, chronic mild back pain, no chest pain, no dark stools or bloody stools, no loss of consciousness or seizures, no focal neurologic deficits, no visual disturbances. Other systems negative.    Physical Exam: Blood pressure (!) 143/81, pulse 66, temperature 97.8 F (36.6 C), resp. rate 15, SpO2 95 %.   BP (!) 143/81   Pulse 66   Temp 97.8 F (36.6 C)   Resp 15   SpO2 95%  General appearance: alert,  cooperative, appears stated age and no distress Neck: no adenopathy, no JVD, supple, symmetrical, trachea midline, thyroid not enlarged, symmetric, no tenderness/mass/nodules and Soft right carotid bruit present Lungs: clear to auscultation bilaterally Heart: S1, S2 normal, no S3 or S4 and systolic murmur: early systolic 2/6, crescendo at 2nd right intercostal space Abdomen: soft, non-tender; bowel sounds normal; no masses,  no organomegaly Extremities: extremities normal, atraumatic, no cyanosis or edema Pulses: 2+ and symmetric Soft right carotid bruit present. Neurologic: Grossly normal  Labs:  Lab Results  Component Value Date   WBC 4.6 09/09/2017   HGB 11.1 (L) 09/09/2017   HCT 34.5 (L) 09/09/2017   MCV 91.5 09/09/2017   PLT 139 (L)  09/09/2017    Recent Labs Lab 09/09/17 0212  NA 139  K 4.8  CL 105  CO2 26  BUN 16  CREATININE 1.13  CALCIUM 9.5  GLUCOSE 88   Recent Labs  09/08/17 1836  TSH 2.575   Radiology: Dg Chest 2 View  Result Date: 09/08/2017 CLINICAL DATA:  Altered mental status. EXAM: CHEST  2 VIEW COMPARISON:  None. FINDINGS: The heart size and mediastinal contours are within normal limits. Both lungs are clear. No pneumothorax or pleural effusion is noted. The visualized skeletal structures are unremarkable. IMPRESSION: No active cardiopulmonary disease. Electronically Signed   By: Marijo Conception, M.D.   On: 09/08/2017 19:46   Ct Head Wo Contrast  Result Date: 09/08/2017 CLINICAL DATA:  Altered level of consciousness EXAM: CT HEAD WITHOUT CONTRAST TECHNIQUE: Contiguous axial images were obtained from the base of the skull through the vertex without intravenous contrast. COMPARISON:  None. FINDINGS: Brain: No acute territorial infarction, hemorrhage or intracranial mass is seen. Mild white matter hypodensity consistent with small vessel ischemic change. Ventricles are nonenlarged. Mild atrophy. Vascular: No hyperdense vessels. Vertebral and carotid artery calcification. Skull: No fracture or suspicious bone lesion Sinuses/Orbits: Mild mucosal thickening in the ethmoid sinuses. No acute orbital abnormality Other: None IMPRESSION: No CT evidence for acute intracranial abnormality. Atrophy and mild small vessel ischemic changes of the white matter. Electronically Signed   By: Donavan Foil M.D.   On: 09/08/2017 19:29    Scheduled Meds: . enoxaparin (LOVENOX) injection  40 mg Subcutaneous Q24H  . ipratropium  2 spray Each Nare Daily  . sodium chloride flush  3 mL Intravenous Q12H  . travoprost (benzalkonium)  1 drop Both Eyes QHS   Continuous Infusions: . lactated ringers 75 mL/hr at 09/09/17 0141   PRN Meds:.acetaminophen **OR** acetaminophen, bisacodyl, HYDROcodone-acetaminophen, morphine injection,  ondansetron **OR** ondansetron (ZOFRAN) IV  CARDIAC STUDIES:  EKG 09/08/2017: Normal sinus rhythm/sinus bradycardia at the rate of 58 bpm, normal axis, right bundle branch block. No evidence of ischemia, normal QT interval.  Office Echocardiogram [08/02/2013]: 1. Left ventricular cavity is normal in size. Moderate concentric hypertrophy. Diastolic filling with impaired relaxation pattern and normal to low pressure. Normal global wall motion. Normal systolic global function. Calculated EF 55%. Doppler evidence of Grade I (impaired) diastolic dysfunction. 2. Left atrial cavity is normal in size. Incidental Patent Foramen Ovale probably present. 3. Mild aortic regurgitation. Tricuspid. 4. Trace mitral regurgitation. Trace tricuspid regurgitation.   ASSESSMENT AND PLAN:  1. Altered mental status, suspect probable TIA. Sick sinus syndrome in a elderly patient needs to be excluded, presently wearing an event monitor for 30 days, will follow-up on his recordings and transmission. 2. Right carotid bruit, needs carotid duplex 3. Possible TIA, would recommend MRI of the brain to exclude stroke. 4.  Hypertension, would not be too aggressive with therapy for now until etiology for his presentation is known. Agree with admission and observation. We will also order for an echocardiogram. D/W Dr. Grandville Silos.   Adrian Prows, MD 09/09/2017, 9:01 AM [facility] [place] Pager: [phone] Office: [phone] If no answer Cell [phone]

## 2017-09-10 ENCOUNTER — Observation Stay (HOSPITAL_COMMUNITY): Payer: Medicare Other

## 2017-09-10 ENCOUNTER — Other Ambulatory Visit (HOSPITAL_COMMUNITY): Payer: Medicare Other

## 2017-09-10 ENCOUNTER — Observation Stay (HOSPITAL_BASED_OUTPATIENT_CLINIC_OR_DEPARTMENT_OTHER): Payer: Medicare Other

## 2017-09-10 ENCOUNTER — Encounter (HOSPITAL_COMMUNITY): Payer: Self-pay

## 2017-09-10 DIAGNOSIS — E538 Deficiency of other specified B group vitamins: Secondary | ICD-10-CM

## 2017-09-10 DIAGNOSIS — I10 Essential (primary) hypertension: Secondary | ICD-10-CM | POA: Diagnosis present

## 2017-09-10 DIAGNOSIS — R42 Dizziness and giddiness: Secondary | ICD-10-CM

## 2017-09-10 DIAGNOSIS — R55 Syncope and collapse: Secondary | ICD-10-CM | POA: Diagnosis not present

## 2017-09-10 DIAGNOSIS — E440 Moderate protein-calorie malnutrition: Secondary | ICD-10-CM | POA: Insufficient documentation

## 2017-09-10 DIAGNOSIS — N179 Acute kidney failure, unspecified: Secondary | ICD-10-CM | POA: Diagnosis not present

## 2017-09-10 DIAGNOSIS — K219 Gastro-esophageal reflux disease without esophagitis: Secondary | ICD-10-CM | POA: Diagnosis not present

## 2017-09-10 DIAGNOSIS — H409 Unspecified glaucoma: Secondary | ICD-10-CM | POA: Diagnosis not present

## 2017-09-10 LAB — CBC WITH DIFFERENTIAL/PLATELET
BASOS ABS: 0 10*3/uL (ref 0.0–0.1)
BASOS PCT: 0 %
EOS PCT: 1 %
Eosinophils Absolute: 0 10*3/uL (ref 0.0–0.7)
HEMATOCRIT: 35.4 % — AB (ref 39.0–52.0)
Hemoglobin: 10.9 g/dL — ABNORMAL LOW (ref 13.0–17.0)
Lymphocytes Relative: 32 %
Lymphs Abs: 1.2 10*3/uL (ref 0.7–4.0)
MCH: 28.5 pg (ref 26.0–34.0)
MCHC: 30.8 g/dL (ref 30.0–36.0)
MCV: 92.7 fL (ref 78.0–100.0)
MONO ABS: 0.8 10*3/uL (ref 0.1–1.0)
MONOS PCT: 23 %
NEUTROS ABS: 1.6 10*3/uL — AB (ref 1.7–7.7)
Neutrophils Relative %: 44 %
PLATELETS: 135 10*3/uL — AB (ref 150–400)
RBC: 3.82 MIL/uL — ABNORMAL LOW (ref 4.22–5.81)
RDW: 15 % (ref 11.5–15.5)
WBC: 3.6 10*3/uL — ABNORMAL LOW (ref 4.0–10.5)

## 2017-09-10 LAB — BASIC METABOLIC PANEL
Anion gap: 6 (ref 5–15)
BUN: 11 mg/dL (ref 6–20)
CALCIUM: 9.4 mg/dL (ref 8.9–10.3)
CO2: 29 mmol/L (ref 22–32)
Chloride: 105 mmol/L (ref 101–111)
Creatinine, Ser: 0.93 mg/dL (ref 0.61–1.24)
GFR calc Af Amer: >60 mL/min (ref >60)
GLUCOSE: 91 mg/dL (ref 65–99)
Potassium: 4.7 mmol/L (ref 3.5–5.1)
Sodium: 140 mmol/L (ref 135–145)

## 2017-09-10 LAB — HIV ANTIBODY (ROUTINE TESTING W REFLEX): HIV SCREEN 4TH GENERATION: NONREACTIVE

## 2017-09-10 LAB — RPR: RPR: NONREACTIVE

## 2017-09-10 MED ORDER — IOPAMIDOL (ISOVUE-370) INJECTION 76%
INTRAVENOUS | Status: AC
  Administered 2017-09-10: 50 mL
  Filled 2017-09-10: qty 50

## 2017-09-10 MED ORDER — SENNA 8.6 MG PO TABS
2.0000 | ORAL_TABLET | ORAL | Status: DC
  Administered 2017-09-10: 17.2 mg via ORAL
  Filled 2017-09-10: qty 2

## 2017-09-10 MED ORDER — TRAMADOL HCL 50 MG PO TABS
50.0000 mg | ORAL_TABLET | Freq: Four times a day (QID) | ORAL | Status: DC | PRN
  Administered 2017-09-11: 50 mg via ORAL
  Filled 2017-09-10: qty 1

## 2017-09-10 MED ORDER — ZOLPIDEM TARTRATE 5 MG PO TABS
5.0000 mg | ORAL_TABLET | Freq: Every evening | ORAL | Status: DC | PRN
  Administered 2017-09-10: 5 mg via ORAL
  Filled 2017-09-10: qty 1

## 2017-09-10 MED ORDER — PANTOPRAZOLE SODIUM 40 MG PO TBEC
40.0000 mg | DELAYED_RELEASE_TABLET | Freq: Two times a day (BID) | ORAL | Status: DC
  Administered 2017-09-10 – 2017-09-11 (×3): 40 mg via ORAL
  Filled 2017-09-10 (×3): qty 1

## 2017-09-10 MED ORDER — ASPIRIN EC 81 MG PO TBEC
81.0000 mg | DELAYED_RELEASE_TABLET | Freq: Every day | ORAL | Status: DC
  Administered 2017-09-10 – 2017-09-11 (×2): 81 mg via ORAL
  Filled 2017-09-10 (×2): qty 1

## 2017-09-10 MED ORDER — AMLODIPINE BESYLATE 2.5 MG PO TABS
2.5000 mg | ORAL_TABLET | Freq: Every day | ORAL | Status: DC
  Administered 2017-09-10 – 2017-09-11 (×2): 2.5 mg via ORAL
  Filled 2017-09-10 (×2): qty 1

## 2017-09-10 NOTE — Progress Notes (Signed)
Initial Nutrition Assessment  DOCUMENTATION CODES:  Non-severe (moderate) malnutrition in context of chronic illness  INTERVENTION:  Continue Ensure Enlive po BID, each supplement provides 350 kcal and 20 grams of protein  Magic cup BID with meals, each supplement provides 290 kcal and 9 grams of protein  Will page MD regarding diet advancement/salt packet  NUTRITION DIAGNOSIS:  Inadequate oral intake related to poor appetite as evidenced by moderate muscle and mild fat depletion  GOAL:  Patient will meet greater than or equal to 90% of their needs  MONITOR:  PO intake  REASON FOR ASSESSMENT:  Malnutrition Screening Tool    ASSESSMENT:  76 y/o male PMHx HTN, GERD, HOH, Chronic pain. Presents after experiencing near syncopal event w/ nausea prior to experiencing sensation of pending diarrhea. Also w/ cold sweats and diaphoresis. Symptoms felt possibly related to arrhythmia. Admitted to hospital for ongoing workup.   Pt reports having a chronically poor appetite, "I dont eat a lot". He says he only eats two meals a day, often because he doesn't get up until around noon. At home, he did not follow any type of therapeutic, he did not take any vitamins or minerals.   Denies any n/v/d. He has intermittent chronic constipation r/t his use of his pain medication. He says every few days he will take a laxative to alleviate this and then he will experience a couple days of loose stools. He says he feels fine at this time.  Patient says he was admitted at a weight of 162.5 lbs? There is limited weight documentation available. Last weights were from 2015, when he was 186 lbs. He has ~30 lbs in 3 years. 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 He says "thats where the weight went"  No documented meal intake at this time. RD discussed potential interventions in the hospital. His wife  states he has tried some nutritional supplements in the past, but he did not like them. She doesn't know which one he tried. Encouraged him to try the Ensure at his bedside. Will also order Magic CUp. Patient says the food is bland and he would eat much better with a salt packet. Noted, however, he has elevated BP. Will confer with MD  Physical Exam: Mild-moderate muscle wasting of upper body and head. Mild-moderate fat wasting of thorax/underarms.   At d/c, if patient truly does not want to drink supplements, RD gave some brief education on a weight maintaining diet. Because pt's appetite is poorer, patient needs to prioritize calorie/protein dense foods.  RD went over the "Big Three" of cheese, Peanut butter and eggs which are some of the most common foods that are high in both kcals and protein. Additionally, encouraged liberal use of condiments so that he can make the most of each bite.   Labs: Reviewed, Largely WDL Meds: B12, PPI, senna, IVF, Hydrocodone,    Recent Labs Lab 09/08/17 1840 09/09/17 0212 09/10/17 0530  NA 137 139 140  K 4.3 4.8 4.7  CL 104 105 105  CO2 24 26 29   BUN 20 16 11   CREATININE 1.41* 1.13 0.93  CALCIUM 9.3 9.5 9.4  GLUCOSE 121* 88 91   Diet Order:  Diet Heart Room service appropriate? Yes; Fluid consistency: Thin  Skin:  Reviewed, no issues  Last BM:  9/19  Height:  Ht Readings from Last 1 Encounters:  09/09/17  6\' 2"  (1.88 m)   Weight:  Wt Readings from Last 1 Encounters:  09/09/17 155 lb 3.3 oz (70.4 kg)   Wt Readings from Last 10 Encounters:  09/09/17 155 lb 3.3 oz (70.4 kg)  05/31/14 186 lb (84.4 kg)  05/29/14 186 lb (84.4 kg)  10/28/09 180 lb (81.6 kg)  09/26/09 177 lb 8 oz (80.5 kg)   Ideal Body Weight:  86.36 kg  BMI:  Body mass index is 19.93 kg/m.  Estimated Nutritional Needs:  Kcal:  1850-2050 kcals (26-29 kcal/kg bw) Protein:  77-92g Pro (1.1-1.3 g/kg bw) Fluid:  >1.8 L fluid (25 ml /kg bw)  EDUCATION NEEDS:  Education  needs addressed  Burtis Junes RD, LDN, CNSC Clinical Nutrition Pager: [phone] 09/10/2017 5:35 PM

## 2017-09-10 NOTE — Consult Note (Addendum)
Referring Physician:  Dr Grandville Silos    Chief Complaint: pre syncope associated with mild confusion  HPI: Andrew Pacheco is an 76 y.o. male with a history of migraine headaches, hypertension, chronic back pain, hearing impaired, and recently evaluated by Dr. Einar Gip for palpitations. The patient has been wearing a heart monitor since seeing Dr. Einar Gip on 08/24/2017. While at work on the evening of 09/08/2017 the patient was at rest, seated near another employee, when he suddenly felt lightheaded and noted that his heart skipped several beats. He developed nausea, cold sweats, diaphoresis, difficulty concentrating, felt somewhat disoriented, and thought he might lose control of his bowels. He felt unable to speak. He knew what he wanted to say but was able to talk very slow. He did not have word finding difficulty. He noticed circular patterns in his visual fields but denied headache. He managed to signal is distress and coworkers called EMS. The patient was brought to [facility]. He still felt abnormal upon arrival. He did not feel back to baseline until sometime yesterday. Included in the differential diagnosis was the possibility of a TIA or a seizure. An EEG is pending. An MRI performed 09/09/2017 showed no acute intracranial abnormalities. A CT angiogram of the head and neck are currently pending.  A cardiology consult was obtained and neurology was also asked to evaluate the patient. The patient was not on antiplatelet therapy prior to admission but is now on aspirin 81 mg daily.  The history was obtained from the patient and his wife. He denies any similar episodes in the past; however, there was an episode four months ago when he was working outside in the heat and came into the house very pale, weak and clammy and seemed unable to speak. He rested and returned to normal. He felt this was somewhat similar to what he experienced the evening of admission.  In July of this year one morning he noticed  his heart was beating rapidly and felt somewhat irregular. He told his wife but declined to go to the emergency department. He went to see his physician and waited several hours to be seen. According to the patient his heart rate remained elevated the whole time. It was at that time that he was referred to Dr. Einar Gip. He believes he's been having palpitations for at least 2 years.   A formal neurology consultation was requested upon patient's family's insistence to Dr. Einar Gip and Dr. Grandville Silos.  Date last known well: Date: 09/08/2017 Time last known well: Time: 17:20 tPA Given: No: No focal deficits  Past Medical History:  Diagnosis Date  . Anemia   . Arthritis    "both hips, back" (09/09/2017)  . Chronic back pain    "middle and lower back" (09/09/2017)  . Colon polyps   . Daily headache   . Diverticulosis   . GERD (gastroesophageal reflux disease)   . Glaucoma, both eyes   . H pylori ulcer   . Hard of hearing   . HTN (hypertension)   . 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 Lumbar disc disease   . Migraine    "q 3-4 months" (09/09/2017)  . Seasonal allergies     Past Surgical History:  Procedure Laterality Date  . APPENDECTOMY    . CYST EXCISION Right ~ 1971   knee    Family History  Problem Relation Age of Onset  . Heart disease Father   . Emphysema Father   . Diverticulosis Father   .  Cancer Mother        gallbladder-mets  . Arthritis Mother   . Liver cancer Mother    Social History:  reports that he has never smoked. He has never used smokeless tobacco. He reports that he drinks alcohol. He reports that he does not use drugs.  Allergies: No Known Allergies  Medications:  Scheduled: . amLODipine  2.5 mg Oral Daily  . aspirin EC  81 mg Oral Daily  . cyanocobalamin  1,000 mcg Intramuscular q1800  . enoxaparin (LOVENOX) injection  40 mg Subcutaneous Q24H  . feeding supplement (ENSURE ENLIVE)  237 mL Oral BID BM  . ipratropium  2 spray Each Nare Daily  . sodium chloride  flush  3 mL Intravenous Q12H  . Travoprost (BAK Free)  1 drop Both Eyes QHS    ROS: History obtained from the patient and wife  General ROS:  positive for fatigue 2 years. Psychological ROS: negative for - behavioral disorder, hallucinations, memory difficulties, mood swings or suicidal ideation Ophthalmic ROS: negative for - blurry vision, double vision, eye pain or loss of vision ENT ROS: negative for - epistaxis, nasal discharge, oral lesions, sore throat, tinnitus or vertigo Allergy and Immunology ROS: negative for - hives or itchy/watery eyes Hematological and Lymphatic ROS: negative for - bleeding problems, bruising or swollen lymph nodes Endocrine ROS: negative for - galactorrhea, hair pattern changes, polydipsia/polyuria or temperature intolerance Respiratory ROS: negative for - cough, hemoptysis, shortness of breath or wheezing Cardiovascular ROS: Positive for palpitations 2 years. Gastrointestinal ROS: negative for - abdominal pain, diarrhea, hematemesis, nausea/vomiting or stool incontinence Genito-Urinary ROS: Positive for recent testicular pain and hematuria - he is scheduled to see a urologist. Musculoskeletal ROS: Positive for chronic back pain. Surgery has been recommended. He is maintained on daily hydrocodone. Neurological ROS: as noted in HPI Dermatological ROS: negative for rash and skin lesion changes   Physical Examination: Vitals:   09/09/17 1608 09/09/17 2009 09/10/17 0000 09/10/17 0400  BP: (!) 142/73 135/68 (!) 153/73 (!) 175/75  Pulse: 66 81 64 66  Resp: 17 18 16 16   Temp: 97.7 F (36.5 C) 98.5 F (36.9 C) 98.3 F (36.8 C) 98.4 F (36.9 C)  TempSrc: Oral Oral Oral Oral  SpO2: 98% 98% 96% 97%  Weight: 155 lb 3.3 oz (70.4 kg)     Height: 6\' 2"  (1.88 m)       General - Pleasant alert 76 year old male in bed in no acute distress Heart - Regular rate and rhythm - no murmer appreciated. Lungs - Clear to auscultation Abdomen - Soft - non  tender Extremities - Distal pulses intact - no edema Skin - Warm and dry  Neurologic Examination:  Mental Status:  Alert, oriented, thought content appropriate. Speech without evidence of dysarthria or aphasia. Able to follow 3 step commands without difficulty.  Cranial Nerves:  II-bilateral visual fields intact III/IV/VI-Pupils were equal and reacted. Extraocular movements were full.  V/VII-no facial numbness and no facial weakness.  VIII-hearing normal.  X-normal speech and symmetrical palatal movement.  XII-midline tongue extension  Motor: 5/5 strength symmetrical throughout.  Muscle tone normal throughout. Sensory: Intact to light touch in all extremities. Deep Tendon Reflexes: 2/4 in both upper extremities. 0/4 both lower extremities. Plantars: Downgoing bilaterally  Cerebellar: Normal finger to nose and heel to shin bilaterally. Gait: not tested  Laboratory Studies:  Basic Metabolic Panel:  Recent Labs Lab 09/08/17 1840 09/09/17 0212 09/10/17 0530  NA 137 139 140  K 4.3 4.8 4.7  CL 104 105 105  CO2 24 26 29   GLUCOSE 121* 88 91  BUN 20 16 11   CREATININE 1.41* 1.13 0.93  CALCIUM 9.3 9.5 9.4   CBC:  Recent Labs Lab 09/08/17 1840 09/09/17 0212 09/10/17 0530  WBC 5.7 4.6 3.6*  NEUTROABS  --   --  1.6*  HGB 11.1* 11.1* 10.9*  HCT 35.3* 34.5* 35.4*  MCV 92.2 91.5 92.7  PLT 166 139* 135*   Urinalysis:   Recent Labs Lab 09/08/17 2120  COLORURINE AMBER*  LABSPEC 1.030  PHURINE 5.0  GLUCOSEU NEGATIVE  HGBUR NEGATIVE  BILIRUBINUR NEGATIVE  KETONESUR 5*  PROTEINUR 30*  NITRITE NEGATIVE  LEUKOCYTESUR NEGATIVE   Other results: EKG: Sinus rhythm rate 58 bpm with intraventricular conduction delay - await cardiology interpretation.  B12 - 235  Imaging: Ct Head Wo Contrast  09/08/2017 IMPRESSION:  No CT evidence for acute intracranial abnormality. Atrophy and mild small vessel ischemic changes of the white matter.   Mr Brain Wo  Contrast 09/09/2017 IMPRESSION:  1. No acute intracranial abnormality.  2. Subcentimeter remote right thalamic lacunar infarct.  3. Mild age-related cerebral atrophy with chronic small vessel ischemic disease.    CT Angiogram Head and Neck  - pending  EEG - pending  Assessment: 76 y.o. male with a history of migraine headaches, hypertension, chronic back pain, hearing impaired, and recently evaluated by Dr. Einar Gip for palpitations admitted for evaluation of a presyncopal episode which occurred while the patient was at work. Associated symptoms included palpitations, feeling lightheaded, nauseated, cold sweats, diaphoresis, difficulty concentrating, disorientation, speech difficulties, and the sensation of impending diarrhea. He did not feel back to normal until the following day. He was wearing a cardiac monitor at the time which was activated. Cardiology and neurology workups in progress. The patient was somewhat dehydrated at time of admission. Also has low normal B12.  Stroke Risk Factors - hypertension and migraine headaches. A lipid profile is pending.   Attending Neurology MD note to follow.  Mikey Bussing PA-C [facility] Pager [phone] 09/10/2017, 3:45 PM    ATTENDING ADDENDUM I have seen and examined the patient independently. I interviewed the patient and his wife, examined the patient, reviewed the chart and reviewed labs and imaging studies. I agree with the H&P documented above. Patient described feeling unwell, diaphoretic, light headed and with palpitation when his symptoms happened. No shaking/seizure activity. No loss of bowel or bladder. No word finding difficulty but rather slowness in producing speech. Maybe dysarthria or anarthria at the time. This does not necessarily localize to left MCA territory. Maybe hypoperfusion (global vs posterior circulation). My suspicion for seizure is very low. EEG has been ordered irrespective and results are  pending. It will be prudent to evaluate head and neck vasculature to assess for any critical stenoses or occlusions, which might have caused hypoperfusion in the setting of dehydration and dysrhythmia. I have reviewed the MRI images independently - no acute stroke, minimal WM disease, small area of right thalamic remote stroke.  IMPRESSION Evaluate for TIA, MRI negative for stroke B12 deficiency Presyncope  Plan:   Await results of CTA H+N  C/w ASA 81 for now  Frequent neuro checks  Continue telemetry.  A1c, Lipid panel  Replete B12 as you are.  Stroke team to follow in the AM for possible TIA vs presyncope.  Amie Portland, MD [facility] [phone]  If 7pm to 7am, please call on call as listed on AMION.

## 2017-09-10 NOTE — Progress Notes (Signed)
VASCULAR LAB PRELIMINARY  PRELIMINARY  PRELIMINARY  PRELIMINARY  Carotid duplex completed.    Preliminary report:  1-39% ICA stenosis.  Vertebral artery flow is antegrade.   Amrita Radu, RVT 09/10/2017, 9:53 AM

## 2017-09-10 NOTE — Care Management Obs Status (Signed)
[facility] NOTIFICATION   Patient Details  Name: Andrew Pacheco [name]: [id number] Date of Birth: 1941/04/03   Medicare Observation Status Notification Given:  Yes    Zenon Mayo, RN 09/10/2017, 7:05 PM

## 2017-09-10 NOTE — Progress Notes (Signed)
BP elevated, pt asymptomatic. Donnal Debar, NP paged. No new orders at this time.

## 2017-09-10 NOTE — Progress Notes (Addendum)
Subjective:  Feels well this morning, states that his fussiness is essentially resolved. States that he is back to baseline.  Objective:  Vital Signs in the last 24 hours: Temp:  [97.7 F (36.5 C)-98.5 F (36.9 C)] 98.4 F (36.9 C) (09/22 0400) Pulse Rate:  [64-81] 66 (09/22 0400) Resp:  [13-19] 16 (09/22 0400) BP: (110-175)/(55-87) 175/75 (09/22 0400) SpO2:  [94 %-98 %] 97 % (09/22 0400) Weight:  [70.4 kg (155 lb 3.3 oz)] 70.4 kg (155 lb 3.3 oz) (09/21 1608)  Intake/Output from previous day: 09/21 0701 - 09/22 0700 In: 783.8 [P.O.:120; I.V.:663.8] Out: 1850 [Urine:1850]  Physical Exam: Blood pressure (!) 175/75, pulse 66, temperature 98.4 F (36.9 C), temperature source Oral, resp. rate 16, height 6\' 2"  (1.88 m), weight 70.4 kg (155 lb 3.3 oz), SpO2 97 %.  General appearance: alert, cooperative, appears stated age and no distress Neck: no adenopathy, no JVD, supple, symmetrical, trachea midline, thyroid not enlarged, symmetric, no tenderness/mass/nodules and Soft right carotid bruit present Lungs: clear to auscultation bilaterally Heart: S1, S2 normal, no S3 or S4 and systolic murmur: early systolic 2/6, crescendo at 2nd right intercostal space Abdomen: soft, non-tender; bowel sounds normal; no masses,  no organomegaly Extremities: extremities normal, atraumatic, no cyanosis or edema Pulses: 2+ and symmetric Soft right carotid bruit present. Neurologic: Grossly normal  Lab Results: BMP  Recent Labs  09/08/17 1840 09/09/17 0212 09/10/17 0530  NA 137 139 140  K 4.3 4.8 4.7  CL 104 105 105  CO2 24 26 29   GLUCOSE 121* 88 91  BUN 20 16 11   CREATININE 1.41* 1.13 0.93  CALCIUM 9.3 9.5 9.4  GFRNONAA 46* >60 >60  GFRAA 54* >60 >60    CBC  Recent Labs Lab 09/10/17 0530  WBC 3.6*  RBC 3.82*  HGB 10.9*  HCT 35.4*  PLT 135*  MCV 92.7  MCH 28.5  MCHC 30.8  RDW 15.0  LYMPHSABS 1.2  MONOABS 0.8  EOSABS 0.0  BASOSABS 0.0    Recent Labs  09/08/17 1836  09/09/17 1557  TSH 2.575 0.969   Cardiac Studies:   EKG 09/08/2017: Normal sinus rhythm/sinus bradycardia at the rate of 58 bpm, normal axis, right bundle branch block. No evidence of ischemia, normal QT interval.  Office Echocardiogram [08/02/2013]: 1. Left ventricular cavity is normal in size. Moderate concentric hypertrophy. Diastolic filling with impaired relaxation pattern and normal to low pressure. Normal global wall motion. Normal systolic global function. Calculated EF 55%. Doppler evidence of Grade I (impaired) diastolic dysfunction. 2. Left atrial cavity is normal in size. Incidental Patent Foramen Ovale probably present. 3. Mild aortic regurgitation. Tricuspid. 4. Trace mitral regurgitation. Trace tricuspid regurgitation.  ECHO: Hospital pending  Event monitor 09/09/2017 at 1740 hrs: NSR  Assessment/Plan:  1. Altered mental status, suspect probable TIA.  2. Right carotid bruit, needs carotid duplex, pending 3. MRI of the brain old  Stroke. Chronic ischemic changes 4. Hypertension.  5. Acute renal failure stage III, resolved.  Recommendation: His symptoms may have been related to dehydration as his serum creatinine has improved dramatically within 24 hours. His event monitor done and is presently wearing at the time of event reveals normal sinus rhythm. Hence do not suspect cardiac etiology for his syncope/near syncope.  May consider neurology consultation. I will signoff off for now, if echocardiogram is not done, it can be performed in the outpatient basis.  Adrian Prows, M.D. 09/10/2017, 9:19 AM [facility] Pager: [phone] Office: [phone] If no answer: [phone]

## 2017-09-10 NOTE — Progress Notes (Addendum)
PROGRESS NOTE    Andrew Pacheco  JXB:[id number] DOB: [birth year] DOA: 09/08/2017 PCP: [name], MD   Brief Narrative:  Patient 76 year old gentleman history of hypertension,,, gastroesophageal reflux disease presented with a near syncopal episode while at work with development of lightheadedness, skipped heartbeats, nausea, sensation of pending diarrhea, inability to focus, slow mentation.   Assessment & Plan:   Principal Problem:   Near syncope Active Problems:   Gastroesophageal reflux disease   AKI (acute kidney injury) ([place])   Glaucoma   Low vitamin B12 level   HTN (hypertension)  #1 near syncope/Acute encephalopathy Patient represented with a near syncopal episode with associated palpitations prior to symptom onset, some memory issues, lightheadedness, nausea. Concern for cardiogenic etiology as patient with palpitations prior to symptom onset. Patient already had an event monitor placed on 08/24/2017 at his cardiologist's office. Per cardiology if and monitor revealing normal sinus rhythm.  Patient also noted to have some slow mental status and as such concerns for neurological etiology. Patient with clinical improvement with his mentation and also states right lower extremity weakness has improved.  Patient with no signs or symptoms of infection. Head CT done was negative for any acute abnormalities.  MRI of the head has been ordered and done and negative for any acute intracranial abnormalities however does show a subcentimeter remote right thalamic lacunar infarct. Mild age-related cerebral atrophy with chronic small vessel ischemic disease. Orthostatics was not checked on admission. Orthostatics checked yesterday morning with some mild orthostasis by pulse. Patient on IV fluids. Carotid Dopplers have been done with preliminary readings with no significant ICA stenosis.  2-D echo pending.  Vitamin B-12 levels were low at 235 and currently been replaced. RPR was  nonreactive. TSH within normal limits 0.969. RBC folate pending.  Will check a CT angiogram of the head and neck. Check a fasting lipid panel. Check a hemoglobin A1c. Will place patient empirically on aspirin 81 mg daily.  If LDL is less than 75 will start patient on a statin. PT/OT/ST. Family very insistent on patient been assessed by neurology even though it was explained to the patient is undergoing a full workup.  Will consult with neurology for further evaluation and management.  Patient has been assessed by cardiology who doesn't feel this is secondary to a cardiac issue.  #2 acute kidney injury Likely secondary to prerenal azotemia. Renal function improving with hydration and currently at baseline. Follow.  #3 history of anemia Stable.  #4 gastroesophageal reflux disease Continue PPI.  # 5 hypertension Resume all home regimen of Norvasc 2.5 mg daily.  #6 low vitamin B 12 levels Vitamin B-12 level was 235. Place on vitamin B 12 1000 MCG's IM daily 7 days and then weekly 1 month, and then monthly. On discharge could transitioned to oral vitamin B-12 supplementation.       DVT prophylaxis: Lovenox Code Status: Full Family Communication: Updated patient and wife at bedside. Also updated patient's son and daughter-in-law via telephone who were requesting neurology evaluation. Disposition Plan: Likely home once workup has been completed.   Consultants:   Cardiology: [facility] 09/09/2017  Neurology--per family request pending  Procedures:   CT head 09/08/2017  MRI head 09/09/2017  Chest x-ray 09/08/2017  2-D echo pending  Carotid Dopplers 09/10/2017-preliminary findings weight 1-39% ICA stenosis. Vertebral artery flow is antegrade.  Antimicrobials:   None   Subjective: Patient states feels much better than on admission with resolution of symptoms. Patient denies any right lower extremity weakness. Patient states his  mentation has improved since admission.  Patient's wife states son and daughter-in-law were inquiring about neurology consultation for further evaluation.  Objective: Vitals:   09/09/17 1608 09/09/17 2009 09/10/17 0000 09/10/17 0400  BP: (!) 142/73 135/68 (!) 153/73 (!) 175/75  Pulse: 66 81 64 66  Resp: 17 18 16 16   Temp: 97.7 F (36.5 C) 98.5 F (36.9 C) 98.3 F (36.8 C) 98.4 F (36.9 C)  TempSrc: Oral Oral Oral Oral  SpO2: 98% 98% 96% 97%  Weight: 70.4 kg (155 lb 3.3 oz)     Height: 6\' 2"  (1.88 m)       Intake/Output Summary (Last 24 hours) at 09/10/17 1332 Last data filed at 09/10/17 0408  Gross per 24 hour  Intake           783.75 ml  Output             1850 ml  Net         -1066.25 ml   Filed Weights   09/09/17 1608  Weight: 70.4 kg (155 lb 3.3 oz)    Examination:  General exam: NAD Respiratory system: Clear to auscultation. No wheezes, no crackles, no rhonchi. Respiratory effort normal. Cardiovascular system: S1 & S2 heard, RRR. No JVD, murmurs, rubs, gallops or clicks. No pedal edema. Gastrointestinal system: Abdomen is Soft, nontender, nondistended, positive bowel sounds. No hepatosplenomegaly.  Central nervous system: Alert and oriented. No focal neurological deficits. Extremities: Symmetric 5 x 5 power. Skin: No rashes, lesions or ulcers Psychiatry: Judgement and insight appear normal. Mood & affect appropriate.     Data Reviewed: I have personally reviewed following labs and imaging studies  CBC:  Recent Labs Lab 09/08/17 1840 09/09/17 0212 09/10/17 0530  WBC 5.7 4.6 3.6*  NEUTROABS  --   --  1.6*  HGB 11.1* 11.1* 10.9*  HCT 35.3* 34.5* 35.4*  MCV 92.2 91.5 92.7  PLT 166 139* 462*   Basic Metabolic Panel:  Recent Labs Lab 09/08/17 1840 09/09/17 0212 09/10/17 0530  NA 137 139 140  K 4.3 4.8 4.7  CL 104 105 105  CO2 24 26 29   GLUCOSE 121* 88 91  BUN 20 16 11   CREATININE 1.41* 1.13 0.93  CALCIUM 9.3 9.5 9.4   GFR: Estimated Creatinine Clearance: 65.2 mL/min (by C-G formula  based on SCr of 0.93 mg/dL). Liver Function Tests: No results for input(s): AST, ALT, ALKPHOS, BILITOT, PROT, ALBUMIN in the last 168 hours. No results for input(s): LIPASE, AMYLASE in the last 168 hours. No results for input(s): AMMONIA in the last 168 hours. Coagulation Profile: No results for input(s): INR, PROTIME in the last 168 hours. Cardiac Enzymes: No results for input(s): CKTOTAL, CKMB, CKMBINDEX, TROPONINI in the last 168 hours. BNP (last 3 results) No results for input(s): PROBNP in the last 8760 hours. HbA1C: No results for input(s): HGBA1C in the last 72 hours. CBG: No results for input(s): GLUCAP in the last 168 hours. Lipid Profile: No results for input(s): CHOL, HDL, LDLCALC, TRIG, CHOLHDL, LDLDIRECT in the last 72 hours. Thyroid Function Tests:  Recent Labs  09/09/17 1557  TSH 0.969   Anemia Panel:  Recent Labs  09/09/17 1557  VITAMINB12 235   Sepsis Labs: No results for input(s): PROCALCITON, LATICACIDVEN in the last 168 hours.  No results found for this or any previous visit (from the past 240 hour(s)).       Radiology Studies: Dg Chest 2 View  Result Date: 09/08/2017 CLINICAL DATA:  Altered mental status. EXAM:  CHEST  2 VIEW COMPARISON:  None. FINDINGS: The heart size and mediastinal contours are within normal limits. Both lungs are clear. No pneumothorax or pleural effusion is noted. The visualized skeletal structures are unremarkable. IMPRESSION: No active cardiopulmonary disease. Electronically Signed   By: Marijo Conception, M.D.   On: 09/08/2017 19:46   Ct Head Wo Contrast  Result Date: 09/08/2017 CLINICAL DATA:  Altered level of consciousness EXAM: CT HEAD WITHOUT CONTRAST TECHNIQUE: Contiguous axial images were obtained from the base of the skull through the vertex without intravenous contrast. COMPARISON:  None. FINDINGS: Brain: No acute territorial infarction, hemorrhage or intracranial mass is seen. Mild white matter hypodensity consistent  with small vessel ischemic change. Ventricles are nonenlarged. Mild atrophy. Vascular: No hyperdense vessels. Vertebral and carotid artery calcification. Skull: No fracture or suspicious bone lesion Sinuses/Orbits: Mild mucosal thickening in the ethmoid sinuses. No acute orbital abnormality Other: None IMPRESSION: No CT evidence for acute intracranial abnormality. Atrophy and mild small vessel ischemic changes of the white matter. Electronically Signed   By: Donavan Foil M.D.   On: 09/08/2017 19:29   Mr Brain Wo Contrast  Result Date: 09/09/2017 CLINICAL DATA:  Initial evaluation for acute altered mental status, near syncope. EXAM: MRI HEAD WITHOUT CONTRAST TECHNIQUE: Multiplanar, multiecho pulse sequences of the brain and surrounding structures were obtained without intravenous contrast. COMPARISON:  [facility] CT from 09/08/2017. FINDINGS: Brain: Generalized age related cerebral atrophy. Mild chronic microvascular ischemic disease seen within the supratentorial white matter. Subcentimeter remote right thalamic lacunar infarct. No abnormal foci of restricted diffusion to suggest acute or subacute ischemia. Gray-white matter differentiation maintained. No other evidence for chronic infarction. No susceptibility artifact to suggest acute or chronic intracranial hemorrhage. No mass lesion, midline shift or mass effect. No hydrocephalus. No extra-axial fluid collection. Major dural sinuses are patent. Pituitary gland suprasellar region within normal limits. Midline structures intact and normal. Vascular: Major intracranial vascular flow voids well maintained. Skull and upper cervical spine: Craniocervical junction normal. Upper cervical spine within normal limits. Bone marrow signal intensity normal. No scalp soft tissue abnormality. Sinuses/Orbits: Globes oral soft tissues within normal limits. Paranasal sinuses are clear. No mastoid effusion. Inner ear structures normal. Other: None. IMPRESSION: 1. No acute  intracranial abnormality. 2. Subcentimeter remote right thalamic lacunar infarct. 3. Mild age-related cerebral atrophy with chronic small vessel ischemic disease. Electronically Signed   By: Jeannine Boga M.D.   On: 09/09/2017 13:43        Scheduled Meds: . amLODipine  2.5 mg Oral Daily  . aspirin EC  81 mg Oral Daily  . cyanocobalamin  1,000 mcg Intramuscular q1800  . enoxaparin (LOVENOX) injection  40 mg Subcutaneous Q24H  . feeding supplement (ENSURE ENLIVE)  237 mL Oral BID BM  . ipratropium  2 spray Each Nare Daily  . sodium chloride flush  3 mL Intravenous Q12H  . Travoprost (BAK Free)  1 drop Both Eyes QHS   Continuous Infusions: . lactated ringers 75 mL/hr at 09/10/17 0725     LOS: 0 days    Time spent: 26 minutes    Mirinda Monte, MD [facility] Pager [phone]  If 7PM-7AM, please contact night-coverage [email or web address] Password [email or web address] 09/10/2017, 1:32 PM

## 2017-09-11 ENCOUNTER — Observation Stay (HOSPITAL_BASED_OUTPATIENT_CLINIC_OR_DEPARTMENT_OTHER): Payer: Medicare Other

## 2017-09-11 ENCOUNTER — Observation Stay (HOSPITAL_COMMUNITY): Payer: Medicare Other

## 2017-09-11 DIAGNOSIS — N179 Acute kidney failure, unspecified: Secondary | ICD-10-CM | POA: Diagnosis not present

## 2017-09-11 DIAGNOSIS — R55 Syncope and collapse: Secondary | ICD-10-CM | POA: Diagnosis not present

## 2017-09-11 DIAGNOSIS — R404 Transient alteration of awareness: Secondary | ICD-10-CM

## 2017-09-11 DIAGNOSIS — K219 Gastro-esophageal reflux disease without esophagitis: Secondary | ICD-10-CM | POA: Diagnosis not present

## 2017-09-11 DIAGNOSIS — I361 Nonrheumatic tricuspid (valve) insufficiency: Secondary | ICD-10-CM | POA: Diagnosis not present

## 2017-09-11 DIAGNOSIS — E538 Deficiency of other specified B group vitamins: Secondary | ICD-10-CM | POA: Diagnosis not present

## 2017-09-11 DIAGNOSIS — I10 Essential (primary) hypertension: Secondary | ICD-10-CM | POA: Diagnosis not present

## 2017-09-11 LAB — ECHOCARDIOGRAM COMPLETE
AOASC: 35 cm
E decel time: 394 msec
E/e' ratio: 6.31
FS: 29 % (ref 28–44)
Height: 74 in
IVS/LV PW RATIO, ED: 1.09
LA ID, A-P, ES: 36 mm
LA diam end sys: 36 mm
LADIAMINDEX: 1.89 cm/m2
LAVOL: 32.5 mL
LAVOLA4C: 36.2 mL
LAVOLIN: 17 mL/m2
LDCA: 3.8 cm2
LV E/e' medial: 6.31
LV PW d: 12.2 mm — AB (ref 0.6–1.1)
LV TDI E'MEDIAL: 7.94
LV e' LATERAL: 8.38 cm/s
LVEEAVG: 6.31
LVOTD: 22 mm
Lateral S' vel: 9.36 cm/s
MV Dec: 394
MV pk E vel: 52.9 m/s
MVPKAVEL: 90.2 m/s
RV TAPSE: 17.1 mm
RV sys press: 26 mmHg
Reg peak vel: 242 cm/s
TDI e' lateral: 8.38
TR max vel: 242 cm/s
Weight: 2490.32 oz

## 2017-09-11 LAB — VAS US CAROTID
LEFT ECA DIAS: -10 cm/s
LEFT VERTEBRAL DIAS: 20 cm/s
LICADDIAS: -39 cm/s
LICAPDIAS: -29 cm/s
LICAPSYS: -104 cm/s
Left CCA dist dias: 32 cm/s
Left CCA dist sys: 109 cm/s
Left CCA prox dias: 29 cm/s
Left CCA prox sys: 124 cm/s
Left ICA dist sys: -112 cm/s
RCCAPDIAS: 21 cm/s
RCCAPSYS: 95 cm/s
RIGHT ECA DIAS: -15 cm/s
RIGHT VERTEBRAL DIAS: -16 cm/s
Right cca dist sys: -133 cm/s

## 2017-09-11 LAB — BASIC METABOLIC PANEL
Anion gap: 7 (ref 5–15)
BUN: 10 mg/dL (ref 6–20)
CO2: 27 mmol/L (ref 22–32)
CREATININE: 0.93 mg/dL (ref 0.61–1.24)
Calcium: 9.3 mg/dL (ref 8.9–10.3)
Chloride: 106 mmol/L (ref 101–111)
GFR calc Af Amer: >60 mL/min (ref >60)
GLUCOSE: 82 mg/dL (ref 65–99)
Potassium: 4.1 mmol/L (ref 3.5–5.1)
SODIUM: 140 mmol/L (ref 135–145)

## 2017-09-11 LAB — CBC
HCT: 33.4 % — ABNORMAL LOW (ref 39.0–52.0)
Hemoglobin: 10.3 g/dL — ABNORMAL LOW (ref 13.0–17.0)
MCH: 28.5 pg (ref 26.0–34.0)
MCHC: 30.8 g/dL (ref 30.0–36.0)
MCV: 92.3 fL (ref 78.0–100.0)
PLATELETS: 142 10*3/uL — AB (ref 150–400)
RBC: 3.62 MIL/uL — ABNORMAL LOW (ref 4.22–5.81)
RDW: 14.9 % (ref 11.5–15.5)
WBC: 3.9 10*3/uL — AB (ref 4.0–10.5)

## 2017-09-11 LAB — LIPID PANEL
CHOL/HDL RATIO: 3.8 ratio
Cholesterol: 139 mg/dL (ref 0–200)
HDL: 37 mg/dL — ABNORMAL LOW (ref >40)
LDL CALC: 97 mg/dL (ref 0–99)
Triglycerides: 27 mg/dL (ref <150)
VLDL: 5 mg/dL (ref 0–40)

## 2017-09-11 LAB — HEMOGLOBIN A1C
HEMOGLOBIN A1C: 5.6 % (ref 4.8–5.6)
MEAN PLASMA GLUCOSE: 114.02 mg/dL

## 2017-09-11 MED ORDER — ENSURE ENLIVE PO LIQD: 237.0000 mL | Freq: Two times a day (BID) | ORAL | 0 refills | Status: AC

## 2017-09-11 MED ORDER — ATORVASTATIN CALCIUM 40 MG PO TABS: 40.0000 mg | ORAL_TABLET | Freq: Every day | ORAL | 1 refills | Status: DC

## 2017-09-11 MED ORDER — ASPIRIN 81 MG PO TBEC: 81.0000 mg | DELAYED_RELEASE_TABLET | Freq: Every day | ORAL | Status: DC

## 2017-09-11 MED ORDER — VITAMIN B-12 1000 MCG PO TABS: 1000.0000 ug | ORAL_TABLET | Freq: Every day | ORAL | 0 refills | Status: DC

## 2017-09-11 MED ORDER — PERFLUTREN LIPID MICROSPHERE
1.0000 mL | INTRAVENOUS | Status: AC | PRN
  Administered 2017-09-11: 3 mL via INTRAVENOUS
  Filled 2017-09-11: qty 10

## 2017-09-11 MED ORDER — AMLODIPINE BESYLATE 2.5 MG PO TABS: 2.5000 mg | ORAL_TABLET | Freq: Every day | ORAL | Status: DC

## 2017-09-11 MED ORDER — ATORVASTATIN CALCIUM 40 MG PO TABS
40.0000 mg | ORAL_TABLET | Freq: Every day | ORAL | Status: DC
  Administered 2017-09-11: 40 mg via ORAL
  Filled 2017-09-11: qty 1

## 2017-09-11 NOTE — Care Management Note (Signed)
Case Management Note  Patient Details  Name: Andrew Pacheco [name]: [id number] Date of Birth: February 10, 1941  Subjective/Objective:    near syncope               Action/Plan: Discharge Planning: NCM spoke to pt and wife at bedside. Had questions about ABN, NCM answered question regarding notice. Pt reports he has RW and cane at home. No other DME needed. Will continue to follow for dc needs.   PCP [name]  Expected Discharge Date:               Expected Discharge Plan:  Home/Self Care  In-House Referral:  NA  Discharge planning Services  CM Consult  Post Acute Care Choice:  NA Choice offered to:  Patient  DME Arranged:  N/A DME Agency:  NA  HH Arranged:  NA HH Agency:  NA  Status of Service:  Completed, signed off  If discussed at [place] of Stay Meetings, dates discussed:    Additional Comments:  Erenest Rasher, RN 09/11/2017, 11:38 AM

## 2017-09-11 NOTE — Progress Notes (Signed)
SLP Cancellation Note  Patient Details Name: Andrew Pacheco [name]: [id number] DOB: [birth year]   Cancelled treatment:       Reason Eval/Treat Not Completed: Patient at procedure or test/unavailable. Will follow up for cognitive linguistic evaluation.  Deneise Lever, [place], [place] Speech-Language Pathologist [phone]   [name] 09/11/2017, 2:27 PM

## 2017-09-11 NOTE — Evaluation (Signed)
Occupational Therapy Evaluation Patient Details Name: Andrew Pacheco [name]: [id number] DOB: [birth year] Today's Date: 09/11/2017    History of Present Illness 76 y.o. male with medical history significant of HTN, glaucoma, and GERD presenting with a near syncopal episode   Clinical Impression   PTA, pt was living his wife and was independent with ADLs, IADLs, and working part-time [employer] in [place]. Pt currently performing ADLs and functional mobility at supervision level. Educated pt on compensatory techniques for back pain during ADLs. Discussed pt participating in exercises program such as silver sneakers; pt verbalized understanding and agreed with would be benefitial. Recommend dc home once medically stable per physician. All acute OT needs met and will sign off. Thank you.     Follow Up Recommendations  No OT follow up;Supervision - Intermittent    Equipment Recommendations  None recommended by OT    Recommendations for Other Services       Precautions / Restrictions Precautions Precautions: Fall Restrictions Weight Bearing Restrictions: No      Mobility Bed Mobility               General bed mobility comments: Pt at EOB with PT upon arrival  Transfers Overall transfer level: Needs assistance Equipment used: None Transfers: Sit to/from Stand Sit to Stand: Supervision         General transfer comment: supervision for safety    Balance Overall balance assessment: Needs assistance Sitting-balance support: No upper extremity supported;Feet supported Sitting balance-Leahy Scale: Good     Standing balance support: No upper extremity supported;During functional activity Standing balance-Leahy Scale: Fair Standing balance comment: Able to stand at sink to complete ADLs without UE support                           ADL either performed or assessed with clinical judgement   ADL Overall ADL's : Needs assistance/impaired                                        General ADL Comments: Pt performing ADLs and funcitonal mobility at supervision level for safety. Pt adjusted socks by bringing ankle to knee. Completed grooming at sink. Educated pt on compensatory techniques for back pain during grooming at sink.      Vision Baseline Vision/History: Wears glasses Wears Glasses: Reading only Patient Visual Report: No change from baseline;Other (comment) (Denies blurry or double vision)       Perception     Praxis      Pertinent Vitals/Pain Pain Assessment: 0-10 Pain Score: 4  Pain Location: back Pain Descriptors / Indicators: Aching;Constant Pain Intervention(s): Monitored during session;Premedicated before session     Hand Dominance Right   Extremity/Trunk Assessment Upper Extremity Assessment Upper Extremity Assessment: Overall WFL for tasks assessed   Lower Extremity Assessment Lower Extremity Assessment: Defer to PT evaluation   Cervical / Trunk Assessment Cervical / Trunk Assessment: Normal;Other exceptions (Back pain)   Communication Communication Communication: HOH   Cognition Arousal/Alertness: Awake/alert Behavior During Therapy: WFL for tasks assessed/performed Overall Cognitive Status: Within Functional Limits for tasks assessed                                 General Comments: Pt demonstrating good ST memory and problem solving. Able to recall 3/3 memory words and  money management task   General Comments       Exercises     Shoulder Instructions      Home Living Family/patient expects to be discharged to:: Private residence Living Arrangements: Spouse/significant other Available Help at Discharge: Family Type of Home: House Home Access: Stairs to enter Technical brewer of Steps: 1 Entrance Stairs-Rails: Left Home Layout: One level     Bathroom Shower/Tub: Teacher, early years/pre: [employer] - single  point;Walker - 2 wheels;Bedside commode          Prior Functioning/Environment Level of Independence: Independent        Comments: Works 2 days/week at [employer] at [employer] in [place] [place].        OT Problem List: Decreased activity tolerance;Impaired balance (sitting and/or standing);Pain      OT Treatment/Interventions:      OT Goals(Current goals can be found in the care plan section) Acute Rehab OT Goals Patient Stated Goal: Go home OT Goal Formulation: With patient Time For Goal Achievement: 09/25/17 Potential to Achieve Goals: Good  OT Frequency:     Barriers to D/C:            Co-evaluation PT/OT/SLP Co-Evaluation/Treatment: Yes Reason for Co-Treatment: To address functional/ADL transfers PT goals addressed during session: Mobility/safety with mobility OT goals addressed during session: ADL's and self-care      AM-PAC PT "6 Clicks" Daily Activity     Outcome Measure Help from another person eating meals?: None Help from another person taking care of personal grooming?: None Help from another person toileting, which includes using toliet, bedpan, or urinal?: None Help from another person bathing (including washing, rinsing, drying)?: A Little Help from another person to put on and taking off regular upper body clothing?: None Help from another person to put on and taking off regular lower body clothing?: A Little 6 Click Score: 22   End of Session Equipment Utilized During Treatment: Gait belt Nurse Communication: Mobility status  Activity Tolerance: Patient tolerated treatment well Patient left: in chair;with call bell/phone within reach;with chair alarm set  OT Visit Diagnosis: Unsteadiness on feet (R26.81);Pain;Muscle weakness (generalized) (M62.81) Pain - Right/Left:  (Back) Pain - part of body:  (Back)                Time: 5726-2035 OT Time Calculation (min): 26 min Charges:  OT General Charges $OT Visit: 1 Visit OT Evaluation $OT  Eval Low Complexity: 1 Low G-Codes: OT G-codes **NOT FOR INPATIENT CLASS** Functional Assessment Tool Used: Clinical judgement Functional Limitation: Self care Self Care Current Status (D9741): At least 1 percent but less than 20 percent impaired, limited or restricted Self Care Goal Status (U3845): 0 percent impaired, limited or restricted Self Care Discharge Status [phone]): At least 1 percent but less than 20 percent impaired, limited or restricted   [place], OTR/L Acute Rehab Pager: [phone] Office: [place] 09/11/2017, 10:39 AM

## 2017-09-11 NOTE — Progress Notes (Signed)
Tula Nakayama to be D/C'd Home per MD order.  Discussed with the patient and all questions fully answered.  VSS, Skin clean, dry and intact without evidence of skin break down, no evidence of skin tears noted. IV catheter discontinued intact. Site without signs and symptoms of complications. Dressing and pressure applied.  An After Visit Summary was printed and given to the patient. Patient received prescription.  D/c education completed with patient/family including follow up instructions, medication list, d/c activities limitations if indicated, with other d/c instructions as indicated by MD - patient able to verbalize understanding, all questions fully answered.   Patient instructed to return to ED, call 911, or call MD for any changes in condition.   Patient escorted via [place], and D/C home via private auto.  Dorris Carnes 09/11/2017 7:32 PM

## 2017-09-11 NOTE — Progress Notes (Signed)
  Echocardiogram 2D Echocardiogram has been performed.  Andrew Pacheco 09/11/2017, 3:23 PM

## 2017-09-11 NOTE — Evaluation (Signed)
Physical Therapy Evaluation Patient Details Name: Andrew Pacheco [name]: [id number] DOB: 1941-01-09 Today's Date: 09/11/2017   History of Present Illness  76 y.o. male with medical history significant of HTN, glaucoma, and GERD presenting with a near syncopal episode  Clinical Impression  Pt admitted with above diagnosis. Pt currently with functional limitations due to the deficits listed below (see PT Problem List). Pt will benefit from skilled PT to increase their independence and safety with mobility to allow discharge to the venue listed below.  Pt is close to baseline with slight balance deficits noted during gait.  Will follow acutely, but no follow up PT needs.  Did discuss doing an exercise program like Silver Sneakers.     Follow Up Recommendations No PT follow up    Equipment Recommendations  None recommended by PT    Recommendations for Other Services       Precautions / Restrictions Precautions Precautions: Fall Restrictions Weight Bearing Restrictions: No      Mobility  Bed Mobility Overal bed mobility: Modified Independent             General bed mobility comments: bed flat with good body mechanics  Transfers Overall transfer level: Needs assistance Equipment used: None Transfers: Sit to/from Stand Sit to Stand: Supervision         General transfer comment: supervision for safety  Ambulation/Gait Ambulation/Gait assistance: Min guard;Supervision Ambulation Distance (Feet): 150 Feet Assistive device: None Gait Pattern/deviations: Decreased step length - right;Decreased step length - left;Wide base of support;Decreased weight shift to right     General Gait Details: Pt with lateral sway and increased L knee flexion in stance phase. used rail < 20 % of the time and decreased balance with head turns  Financial trader Rankin (Stroke Patients Only)       Balance Overall balance assessment: Needs  assistance Sitting-balance support: No upper extremity supported;Feet supported Sitting balance-Leahy Scale: Good     Standing balance support: No upper extremity supported;During functional activity Standing balance-Leahy Scale: Fair Standing balance comment: Able to perform ADLs without UE support.  Pt able to pick up item off of the floor.                             Pertinent Vitals/Pain Pain Assessment: 0-10 Pain Score: 4  Pain Location: back Pain Descriptors / Indicators: Aching;Constant Pain Intervention(s): Monitored during session;Premedicated before session    Home Living Family/patient expects to be discharged to:: Private residence Living Arrangements: Spouse/significant other Available Help at Discharge: Family Type of Home: House Home Access: Stairs to enter Entrance Stairs-Rails: Left Entrance Stairs-Number of Steps: 1 Home Layout: One level Home Equipment: Cane - single point;Walker - 2 wheels;Bedside commode      Prior Function Level of Independence: Independent         Comments: Works 2 days/week at [employer] in [employer].     Hand Dominance   Dominant Hand: Right    Extremity/Trunk Assessment   Upper Extremity Assessment Upper Extremity Assessment: Defer to OT evaluation    Lower Extremity Assessment Lower Extremity Assessment: Overall WFL for tasks assessed    Cervical / Trunk Assessment Cervical / Trunk Assessment: Normal;Other exceptions Cervical / Trunk Exceptions: back pain (chronic)  Communication   Communication: HOH  Cognition Arousal/Alertness: Awake/alert Behavior During Therapy: WFL for tasks assessed/performed Overall Cognitive  Status: Within Functional Limits for tasks assessed                                 General Comments: Pt demonstrating good ST memory and problem solving. Able to recall 3/3 memory words and money management task      General Comments       Exercises     Assessment/Plan    PT Assessment Patient needs continued PT services  PT Problem List Decreased activity tolerance;Decreased balance;Decreased mobility       PT Treatment Interventions      PT Goals (Current goals can be found in the Care Plan section)  Acute Rehab PT Goals Patient Stated Goal: Go home PT Goal Formulation: With patient Time For Goal Achievement: 09/25/17 Potential to Achieve Goals: Good    Frequency     Barriers to discharge        Co-evaluation   Reason for Co-Treatment: To address functional/ADL transfers PT goals addressed during session: Mobility/safety with mobility OT goals addressed during session: ADL's and self-care       AM-PAC PT "6 Clicks" Daily Activity  Outcome Measure Difficulty turning over in bed (including adjusting bedclothes, sheets and blankets)?: None Difficulty moving from lying on back to sitting on the side of the bed? : None Difficulty sitting down on and standing up from a chair with arms (e.g., wheelchair, bedside commode, etc,.)?: None Help needed moving to and from a bed to chair (including a wheelchair)?: None Help needed walking in hospital room?: A Little Help needed climbing 3-5 steps with a railing? : A Little 6 Click Score: 22    End of Session Equipment Utilized During Treatment: Gait belt Activity Tolerance: Patient tolerated treatment well Patient left: in chair;with call bell/phone within reach;with chair alarm set   PT Visit Diagnosis: Difficulty in walking, not elsewhere classified (R26.2)    Time: 4332-9518 PT Time Calculation (min) (ACUTE ONLY): 39 min   Charges:   PT Evaluation $PT Eval Low Complexity: 1 Low     PT G Codes:   PT G-Codes **NOT FOR INPATIENT CLASS** Functional Assessment Tool Used: AM-PAC 6 Clicks Basic Mobility Functional Limitation: Mobility: Walking and moving around Mobility: Walking and Moving Around Current Status (A4166): At least 20 percent but less than 40  percent impaired, limited or restricted Mobility: Walking and Moving Around Goal Status [phone]): 0 percent impaired, limited or restricted    Andrew Pacheco, [place] Pager [phone] 09/11/2017  Andrew Pacheco 09/11/2017, 12:38 PM

## 2017-09-11 NOTE — Progress Notes (Signed)
STROKE TEAM PROGRESS NOTE   HISTORY OF PRESENT ILLNESS (per record) Andrew Pacheco is an 76 y.o. male with a history of migraine headaches, hypertension, chronic back pain, hearing impaired, and recently evaluated by Dr. Einar Gip for palpitations. The patient has been wearing a heart monitor since seeing Dr. Einar Gip on 08/24/2017. While at work on the evening of 09/08/2017 the patient was at rest, seated near another employee, when he suddenly felt lightheaded and noted that his heart skipped several beats. He developed nausea, cold sweats, diaphoresis, difficulty concentrating, felt somewhat disoriented, and thought he might lose control of his bowels. He felt unable to speak. He knew what he wanted to say but was able to talk very slow. He did not have word finding difficulty. He noticed circular patterns in his visual fields but denied headache. He managed to signal his distress and coworkers called EMS. The patient was brought to [facility]. He still felt abnormal upon arrival. He did not feel back to baseline until sometime yesterday. Included in the differential diagnosis was the possibility of a TIA or a seizure. An EEG is pending. An MRI performed 09/09/2017 showed no acute intracranial abnormalities. A CT angiogram of the head and neck are currently pending.  A cardiology consult was obtained and neurology was also asked to evaluate the patient. The patient was not on antiplatelet therapy prior to admission but is now on aspirin 81 mg daily.  The history was obtained from the patient and his wife. He denies any similar episodes in the past; however, there was an episode four months ago when he was working outside in the heat and came into the house very pale, weak and clammy and seemed unable to speak. He rested and returned to normal. He felt this was somewhat similar to what he experienced the evening of admission.  In July of this year one morning he noticed his heart was beating rapidly and  felt somewhat irregular. He told his wife but declined to go to the emergency department. He went to see his physician and waited several hours to be seen. According to the patient his heart rate remained elevated the whole time. It was at that time that he was referred to Dr. Einar Gip. He believes he's been having palpitations for at least 2 years.   A formal neurology consultation was requested upon patient's family's insistence to Dr. Einar Gip and Dr. Grandville Silos.  Date last known well: Date: 09/08/2017 Time last known well: Time: 17:20 tPA Given: No: No focal deficits   SUBJECTIVE (INTERVAL HISTORY) His wife and son are at the bedside.  They recounted HPI with me. As per pt, he was sitting in the table at the time of the event. Suddenly, he felt lightheadedness and generalized weakness, and diffuse diaphoretic. He put his head on the desk and hope it would get better. He called his co-worker to call 911. Then he had urge to go to bathroom, he did not have diarrhea but a lot of gas came out. On EMS arrival, he had difficulty focus, answer questions, tried to talk but too weak to say anything. He could not remember what the BP at that time was. In ER, symptoms better and by second day, his symptoms resolved. Family stated that his usual health at home is excellent. This was the first time it happend   OBJECTIVE Temp:  [98.1 F (36.7 C)-98.7 F (37.1 C)] 98.1 F (36.7 C) (09/23 0500) Pulse Rate:  [68-75] 68 (09/23 0500) Cardiac  Rhythm: Bundle branch block (09/23 0053) Resp:  [16-17] 17 (09/23 0500) BP: (133-165)/(61-71) 133/61 (09/23 0500) SpO2:  [94 %-99 %] 94 % (09/23 0500) Weight:  [155 lb 10.3 oz (70.6 kg)] 155 lb 10.3 oz (70.6 kg) (09/23 0500)  CBC:   Recent Labs Lab 09/10/17 0530 09/11/17 0508  WBC 3.6* 3.9*  NEUTROABS 1.6*  --   HGB 10.9* 10.3*  HCT 35.4* 33.4*  MCV 92.7 92.3  PLT 135* 142*    Basic Metabolic Panel:   Recent Labs Lab 09/10/17 0530 09/11/17 0508  NA 140  140  K 4.7 4.1  CL 105 106  CO2 29 27  GLUCOSE 91 82  BUN 11 10  CREATININE 0.93 0.93  CALCIUM 9.4 9.3    Lipid Panel:     Component Value Date/Time   CHOL 139 09/11/2017 0508   TRIG 27 09/11/2017 0508   HDL 37 (L) 09/11/2017 0508   CHOLHDL 3.8 09/11/2017 0508   VLDL 5 09/11/2017 0508   LDLCALC 97 09/11/2017 0508   HgbA1c:  Lab Results  Component Value Date   HGBA1C 5.6 09/11/2017   Urine Drug Screen: No results found for: LABOPIA, COCAINSCRNUR, LABBENZ, AMPHETMU, THCU, LABBARB  Alcohol Level No results found for: [place] I have personally reviewed the radiological images below and agree with the radiology interpretations.  Ct Angio Head W Or Wo Contrast Ct Angio Neck W Or Wo Contrast 09/10/2017 IMPRESSION:  No significant carotid or vertebral artery stenosis in the neck.  No significant intracranial stenosis.   No large vessel occlusion.  Atrophy with chronic microvascular ischemic change in the white matter.  No acute intracranial abnormality.   Mr Brain Wo Contrast 09/09/2017 IMPRESSION:  1. No acute intracranial abnormality.  2. Subcentimeter remote right thalamic lacunar infarct.  3. Mild age-related cerebral atrophy with chronic small vessel ischemic disease.   CT head without contrast  09/08/2017 No CT evidence for acute intracranial abnormality. Atrophy and mild small vessel ischemic changes of the white matter.  Transthoracic Echocardiogram  09/11/2017 Study Conclusions - Left ventricle: The cavity size was normal. Wall thickness was   increased in a pattern of mild LVH. Systolic function was normal.   The estimated ejection fraction was in the range of 60% to 65%.   Wall motion was normal; there were no regional wall motion   abnormalities. - Aortic valve: Mildly calcified annulus. Trileaflet; mildly   calcified leaflets. There was mild regurgitation. - Mitral valve: Mildly calcified annulus. - Right atrium: Central venous pressure (est):  3 mm Hg. - Atrial septum: No defect or patent foramen ovale was identified. - Tricuspid valve: There was mild regurgitation. - Pulmonary arteries: PA peak pressure: 26 mm Hg (S). - Pericardium, extracardiac: There was no pericardial effusion. Impressions: - Mild LVH with LVEF 60-65% and grade 1 diastolic dysfunction. No   obvious wall motion abnormalities with Definity contrast. Mildly   calcified mitral and aortic annulus. Mild aortic regurgitation.   Mild tricuspid regurgitation with estimated PASP 26 mmHg.   Bilateral Carotid Dopplers  09/10/2017 1-39% ICA stenosis.  Vertebral artery flow is antegrade.   EEG The EEG is normal with patient recorded in awake state only.   PHYSICAL EXAM Vitals:   09/10/17 0400 09/10/17 1710 09/10/17 1944 09/11/17 0500  BP: (!) 175/75 (!) 165/71 (!) 147/66 133/61  Pulse: 66 70 75 68  Resp: 16 16 17 17   Temp: 98.4 F (36.9 C) 98.5 F (36.9 C) 98.7 F (37.1 C) 98.1 F (  36.7 C)  TempSrc: Oral Oral Oral Oral  SpO2: 97% 98% 99% 94%  Weight:    155 lb 10.3 oz (70.6 kg)  Height:       General - Well nourished, well developed, in no apparent distress.  Ophthalmologic - Fundi not visualized due to small pupils.  Cardiovascular - Regular rate and rhythm.  Mental Status -  Level of arousal and orientation to time, place, and person were intact. Language including expression, naming, repetition, comprehension was assessed and found intact. Attention span and concentration test showed able to calculate, but spelt WORLD backward wrong. Recent and remote memory were intact. Fund of Knowledge was assessed and was intact.  Cranial Nerves II - XII - II - Visual field intact OU. III, IV, VI - Extraocular movements intact. V - Facial sensation intact bilaterally. VII - Facial movement intact bilaterally. VIII - Hearing & vestibular intact bilaterally. X - Palate elevates symmetrically. XI - Chin turning & shoulder shrug intact bilaterally XII -  Tongue protrusion intact.  Motor Strength - The patient's strength was normal in all extremities and pronator drift was absent.  Bulk was normal and fasciculations were absent.   Motor Tone - Muscle tone was assessed at the neck and appendages and was normal.  Reflexes - The patient's reflexes were 1+ in all extremities and he had no pathological reflexes.  Sensory - Light touch, temperature/pinprick were assessed and were symmetrical.    Coordination - The patient had normal movements in the hands with no ataxia or dysmetria.  Tremor was absent.  Gait and Station - deferred    ASSESSMENT/PLAN Mr. ORBIN MAYEUX is a 76 y.o. male with history of migraine headaches, hypertension, chronic back pain, hearing impaired, and recently evaluated by Dr. Einar Gip for palpitations presenting with presyncope associated with palpitations, nausea, cold sweats, diaphoresis, difficulty concentrating, visual disturbance, difficulty speaking, and mild disorientation. He did not receive IV t-PA due to no focal deficits noted.  Vasovagal syncope - pt lightheadedness, diffuse diaphoresis, urge to go bathroom, generalized weakness are consistent with typical vasovagal syncope. Less likely TIA or seizure.   Resultant  Back to baseline  CT head - no evidence of acute intracranial abnormality. Atrophy and mild small vessel ischemic changes.  CTA H&N - no acute intracranial abnormality.  MRI head - no acute abnormality noted. Subcentimeter remote right thalamic lacunar infarct.   Carotid Doppler - 1-39% ICA stenosis.  Vertebral artery flow is antegrade.   2D Echo - EF 60-65%. No cardiac source of emboli identified.  EEG - normal  LDL - 97  HgbA1c - 5.6  VTE prophylaxis -  - Lovenox Diet Heart Room service appropriate? Yes; Fluid consistency: Thin  No antithrombotic prior to admission, now on aspirin 81 mg daily. Continue ASA on discharge.  Patient counseled to be compliant with his antithrombotic  medications  Ongoing aggressive stroke risk factor management  Therapy recommendations: none  Disposition:  home  Hypertension  Stable  Avoid hypotension, dehydration.   Long-term BP goal normotensive  Hyperlipidemia  Home meds: No lipid lowering medications prior to admission   LDL 97, goal < 70  Add Lipitor 40 mg daily   Continue statin at discharge  Other Stroke Risk Factors  Advanced age  ETOH use, advised to drink no more than 1 drink(s) a day  Hx stroke/TIA - MRI - Subcentimeter remote right thalamic lacunar infarct.   Migraines  Other Active Problems  Chronic back pain  Pancytopenia, mild  Hospital day #  0  Neurology will sign off. Please call with questions. No neuro follow up needed at this time. Thanks for the consult. I had long discussion with pt, Wife and son at bedside, updated pt current condition, treatment plan and diagnosis. They expressed understanding and appreciation.   Rosalin Hawking, MD PhD Stroke Neurology 09/12/2017 6:32 AM   To contact Stroke Continuity provider, please refer to [email or web address] After hours, contact General Neurology

## 2017-09-11 NOTE — Procedures (Addendum)
Date of recording 09/11/2017  Referring physician Irine Seal   Technical Multichannel digital EEG recording using 10-20 international electrode system  Reason for the study 69 male presented with nausea, cold sweats, diaphoresis, difficulty concentrating, felt somewhat disoriented  Description of the recording Posterior dominant rhythm is 8-9 Hz symmetrical and reactive  Sleep architecture not seen Epileptiform activity was not seen during this recording  Impression  The EEG is normal with patient recorded in awake state only.

## 2017-09-11 NOTE — Discharge Summary (Signed)
Physician Discharge Summary  NASSER KU IWP:[id number] DOB: 1941-01-30 DOA: 09/08/2017  PCP: [name], MD  Admit date: 09/08/2017 Discharge date: 09/11/2017  Time spent: 65 minutes  Recommendations for Outpatient Follow-up:  1. Follow-up with [name], MD in 1-2 weeks. On follow-up patient's low vitamin B-12 levels will need to be followed up upon and patient will likely need a repeat lab work done in about 4-6 weeks. Patient will also need a basic metabolic profile done to follow-up on electrolytes and renal function.   Discharge Diagnoses:  Principal Problem:   Near syncope Active Problems:   Vasodepressor syncope   Vasovagal near syncope   Gastroesophageal reflux disease   AKI (acute kidney injury) ([place])   Glaucoma   Low vitamin B12 level   HTN (hypertension)   Malnutrition of moderate degree   Discharge Condition: stable and improved  Diet recommendation: heart healthy  Filed Weights   09/09/17 1608 09/11/17 0500  Weight: 70.4 kg (155 lb 3.3 oz) 70.6 kg (155 lb 10.3 oz)    History of present illness:  Per Dr Suzanne Boron is a 76 y.o. male with medical history significant of HTN, glaucoma, and GERD presented with a near syncopal episode. While at work on night of admission, he was sitting in a chair and doing nothing - so long as there is no emergency, then he has nothing to do at work.  He developed acute onset of lightheadedness, his heart skipped a few beats, and then he felt very nauseated.  He developed a sensation of pending diarrhea.  Then he felt cold sweats and diaphoresis.  He was unable to focus or do anything.  He told someone that there was a problem and the on-call nurse came to check him.  He went to the bathroom and he had a lot of gas without diarrhea.  The nausea resolved but he was still disoriented, had difficulty identifying familiar people, difficulty speaking, difficulty answering simple questions.  They called 911. He had been  wearing a [employer] Heart Monitor for the last 2 weeks (out of 4) - heart seemed to skip a beat periodically and during those times he feels completely exhausted.  He currently has a headache over his right eye.  He did have chronic back pain.  Takes Hydrocodone 1-2 times daily.  Takes BC powders 1-2 times daily.  He has been easily weak in the last 6 weeks and his activities have been limited by his heart seeming to flutter or skip.  Chronic pain from right testicle to waist - plan for pelvic CT by urology next week.  Also passing some blood in his urine frequently.  ED Course: CXR negative.  Troponin negative and labs stable.  Dizziness improved with meclizine  Hospital Course:  #1 near syncope/Acute encephalopathy likely secondarry to vasovagal versus vasodepressive episode Patient represented with a near syncopal episode with associated palpitations prior to symptom onset, some memory issues, lightheadedness, nausea. Initially concern for cardiogenic etiology as patient with palpitations prior to symptom onset. Patient already had an event monitor placed on 08/24/2017 at his cardiologist's office. Per cardiology if and monitor revealing normal sinus rhythm.  Patient also noted to have some slow mental status and as such concerns for neurological etiology. Patient with clinical improvement with his mentation and also states right lower extremity weakness has improved.  Patient with no signs or symptoms of infection. Head CT done was negative for any acute abnormalities.  MRI of the head has been ordered  and done and negative for any acute intracranial abnormalities however does show a subcentimeter remote right thalamic lacunar infarct. Mild age-related cerebral atrophy with chronic small vessel ischemic disease. Orthostatics was not checked on admission. Patient placed on IV fluids. Carotid Dopplers were done with preliminary readings with no significant ICA stenosis.  2-D echo with a EF of  60-65%, no wall motion abnormalities, no source of emboli no significant valvular abnormalities. Patient underwent a CT angiogram of head and neck which showed no significant carotid or vertebral artery stenosis in the neck. No significant intracranial stenosis. No large vessel occlusion. EEG which was done which was normal with patient recorded in the awake state only. Vitamin B-12 levels were low at 235 and B-12 supplementation started June the hospitalization. RPR was nonreactive. TSH within normal limits 0.969.  Fasting lipid panel which was done had a LDL of 97. Hemoglobin A1c was 5.6. Patient placed on aspirin 81 mg daily as well as a statin. Family was very insistent on patient venous has by neurology came and assessed the patient. Neurology was in agreement with current workup. It was felt that patient may possibly have had a vasovagal event. Both cardiac and neurological workup were unremarkable Patient was back to baseline by day of discharge.  #2 acute kidney injury Likely secondary to prerenal azotemia. Renal function improved with hydration and back to baseline by day of discharge.   #3 history of anemia Stable.  #4 gastroesophageal reflux disease Patient maintained on a PPI.  # 5 hypertension Patient was placed back on home regimen of Norvasc 2.5 mg daily.  #6 low vitamin B 12 levels Vitamin B-12 level was 235. Placed on vitamin B 12 1000 MCG's IM daily during the hospitalization and patient be discharged home on oral vitamin B-12 1000 MCG's daily. Outpatient follow-up with PCP.     Procedures:  CT head 09/08/2017  MRI head 09/09/2017  Chest x-ray 09/08/2017  2-D echo 09/11/2017  Carotid Dopplers 09/10/2017-preliminary findings weight 1-39% ICA stenosis. Vertebral artery flow is antegrade.  EEG 09/11/2017  CT angio head and neck 09/10/2017   Consultations:  Cardiology: [facility] 09/09/2017  Neurology--Dr Rory Percy 09/10/2017  Discharge  Exam: Vitals:   09/10/17 1944 09/11/17 0500  BP: (!) 147/66 133/61  Pulse: 75 68  Resp: 17 17  Temp: 98.7 F (37.1 C) 98.1 F (36.7 C)  SpO2: 99% 94%    General: NAD Cardiovascular: RRR Respiratory: CTAB  Discharge Instructions   Discharge Instructions    Diet - low sodium heart healthy    Complete by:  As directed    Increase activity slowly    Complete by:  As directed      Current Discharge Medication List    START taking these medications   Details  amLODipine (NORVASC) 2.5 MG tablet Take 1 tablet (2.5 mg total) by mouth daily.    aspirin EC 81 MG EC tablet Take 1 tablet (81 mg total) by mouth daily.    atorvastatin (LIPITOR) 40 MG tablet Take 1 tablet (40 mg total) by mouth daily at 6 PM. Qty: 30 tablet, Refills: 1   Associated Diagnoses: Hyperlipidemia LDL goal <70    feeding supplement, ENSURE ENLIVE, (ENSURE ENLIVE) LIQD Take 237 mLs by mouth 2 (two) times daily between meals. Qty: 237 mL, Refills: 0    vitamin B-12 (CYANOCOBALAMIN) 1000 MCG tablet Take 1 tablet (1,000 mcg total) by mouth daily. Qty: 30 tablet, Refills: 0      CONTINUE these medications which have NOT CHANGED  Details  bisacodyl (DULCOLAX) 10 MG suppository Place 10 mg rectally as needed for moderate constipation.    HYDROcodone-acetaminophen (NORCO/VICODIN) 5-325 MG tablet Take 1 tablet by mouth 2 (two) times daily.     ipratropium (ATROVENT) 0.06 % nasal spray Place 2 sprays into both nostrils daily.     omeprazole (PRILOSEC) 20 MG capsule TAKE 20MG  BY MOUTH AS NEEDED    Phenylephrine-Cocoa Butter (PREPARATION H) 0.25-88.44 % SUPP Place rectally as needed.    senna (SENOKOT) 8.6 MG TABS tablet Take 2 tablets by mouth every other day.    travoprost, benzalkonium, (TRAVATAN) 0.004 % ophthalmic solution Place 1 drop into both eyes at bedtime.    zolpidem (AMBIEN) 10 MG tablet Take 10 mg by mouth at bedtime.       STOP taking these medications     Aspirin-Salicylamide-Caffeine  (BC HEADACHE POWDER PO)        No Known Allergies Follow-up Information    [name], MD. Schedule an appointment as soon as possible for a visit in 1 week(s).   Specialty:  Internal Medicine Why:  f/u in 1-2 weeks. Contact information: [address] [place] [address] [phone]            The results of significant diagnostics from this hospitalization (including imaging, microbiology, ancillary and laboratory) are listed below for reference.    Significant Diagnostic Studies: Ct Angio Head W Or Wo Contrast  Result Date: 09/10/2017 CLINICAL DATA:  Stroke EXAM: CT ANGIOGRAPHY HEAD AND NECK TECHNIQUE: Multidetector CT imaging of the head and neck was performed using the standard protocol during bolus administration of intravenous contrast. Multiplanar CT image reconstructions and MIPs were obtained to evaluate the vascular anatomy. Carotid stenosis measurements (when applicable) are obtained utilizing NASCET criteria, using the distal internal carotid diameter as the denominator. CONTRAST:  50 mL Isovue 370 IV COMPARISON:  MRI 09/09/2017 FINDINGS: CT HEAD FINDINGS Brain: Mild atrophy. Chronic microvascular ischemic change in the white matter. No acute infarct. Negative for hemorrhage or mass. Vascular: Negative for hyperdense vessel Skull: Negative Sinuses: Negative Orbits: Negative Review of the MIP images confirms the above findings CTA NECK FINDINGS Aortic arch: Normal aortic arch. Proximal great vessels all widely patent. Right carotid system: Mild atherosclerotic disease right carotid bifurcation. Right internal carotid artery widely patent. Moderate stenosis origin of right external carotid artery. Left carotid system: Mild atherosclerotic disease at the left carotid bifurcation. No significant stenosis Vertebral arteries: Left vertebral artery dominant. Both vertebral artery is widely patent without significant stenosis. Skeleton: No acute skeletal abnormality. Other  neck: Negative Upper chest: Negative Review of the MIP images confirms the above findings CTA HEAD FINDINGS Anterior circulation: Cavernous carotid widely patent bilaterally. Anterior and middle cerebral arteries widely patent bilaterally without stenosis. Posterior circulation: Both vertebral arteries patent to the basilar. PICA patent bilaterally. Basilar widely patent. AICA, superior cerebellar, posterior cerebral arteries widely patent Venous sinuses: Patent Anatomic variants: Negative for cerebral aneurysm Delayed phase: Normal enhancement on delayed imaging Review of the MIP images confirms the above findings IMPRESSION: No significant carotid or vertebral artery stenosis in the neck. No significant intracranial stenosis.  No large vessel occlusion Atrophy with chronic microvascular ischemic change in the white matter. No acute intracranial abnormality. Electronically Signed   By: Franchot Gallo M.D.   On: 09/10/2017 17:26   Dg Chest 2 View  Result Date: 09/08/2017 CLINICAL DATA:  Altered mental status. EXAM: CHEST  2 VIEW COMPARISON:  None. FINDINGS: The heart size and mediastinal contours are  within normal limits. Both lungs are clear. No pneumothorax or pleural effusion is noted. The visualized skeletal structures are unremarkable. IMPRESSION: No active cardiopulmonary disease. Electronically Signed   By: Marijo Conception, M.D.   On: 09/08/2017 19:46   Ct Head Wo Contrast  Result Date: 09/08/2017 CLINICAL DATA:  Altered level of consciousness EXAM: CT HEAD WITHOUT CONTRAST TECHNIQUE: Contiguous axial images were obtained from the base of the skull through the vertex without intravenous contrast. COMPARISON:  None. FINDINGS: Brain: No acute territorial infarction, hemorrhage or intracranial mass is seen. Mild white matter hypodensity consistent with small vessel ischemic change. Ventricles are nonenlarged. Mild atrophy. Vascular: No hyperdense vessels. Vertebral and carotid artery calcification.  Skull: No fracture or suspicious bone lesion Sinuses/Orbits: Mild mucosal thickening in the ethmoid sinuses. No acute orbital abnormality Other: None IMPRESSION: No CT evidence for acute intracranial abnormality. Atrophy and mild small vessel ischemic changes of the white matter. Electronically Signed   By: Donavan Foil M.D.   On: 09/08/2017 19:29   Ct Angio Neck W Or Wo Contrast  Result Date: 09/10/2017 CLINICAL DATA:  Stroke EXAM: CT ANGIOGRAPHY HEAD AND NECK TECHNIQUE: Multidetector CT imaging of the head and neck was performed using the standard protocol during bolus administration of intravenous contrast. Multiplanar CT image reconstructions and MIPs were obtained to evaluate the vascular anatomy. Carotid stenosis measurements (when applicable) are obtained utilizing NASCET criteria, using the distal internal carotid diameter as the denominator. CONTRAST:  50 mL Isovue 370 IV COMPARISON:  MRI 09/09/2017 FINDINGS: CT HEAD FINDINGS Brain: Mild atrophy. Chronic microvascular ischemic change in the white matter. No acute infarct. Negative for hemorrhage or mass. Vascular: Negative for hyperdense vessel Skull: Negative Sinuses: Negative Orbits: Negative Review of the MIP images confirms the above findings CTA NECK FINDINGS Aortic arch: Normal aortic arch. Proximal great vessels all widely patent. Right carotid system: Mild atherosclerotic disease right carotid bifurcation. Right internal carotid artery widely patent. Moderate stenosis origin of right external carotid artery. Left carotid system: Mild atherosclerotic disease at the left carotid bifurcation. No significant stenosis Vertebral arteries: Left vertebral artery dominant. Both vertebral artery is widely patent without significant stenosis. Skeleton: No acute skeletal abnormality. Other neck: Negative Upper chest: Negative Review of the MIP images confirms the above findings CTA HEAD FINDINGS Anterior circulation: Cavernous carotid widely patent  bilaterally. Anterior and middle cerebral arteries widely patent bilaterally without stenosis. Posterior circulation: Both vertebral arteries patent to the basilar. PICA patent bilaterally. Basilar widely patent. AICA, superior cerebellar, posterior cerebral arteries widely patent Venous sinuses: Patent Anatomic variants: Negative for cerebral aneurysm Delayed phase: Normal enhancement on delayed imaging Review of the MIP images confirms the above findings IMPRESSION: No significant carotid or vertebral artery stenosis in the neck. No significant intracranial stenosis.  No large vessel occlusion Atrophy with chronic microvascular ischemic change in the white matter. No acute intracranial abnormality. Electronically Signed   By: Franchot Gallo M.D.   On: 09/10/2017 17:26   Mr Brain Wo Contrast  Result Date: 09/09/2017 CLINICAL DATA:  Initial evaluation for acute altered mental status, near syncope. EXAM: MRI HEAD WITHOUT CONTRAST TECHNIQUE: Multiplanar, multiecho pulse sequences of the brain and surrounding structures were obtained without intravenous contrast. COMPARISON:  [facility] CT from 09/08/2017. FINDINGS: Brain: Generalized age related cerebral atrophy. Mild chronic microvascular ischemic disease seen within the supratentorial white matter. Subcentimeter remote right thalamic lacunar infarct. No abnormal foci of restricted diffusion to suggest acute or subacute ischemia. Gray-white matter differentiation maintained. No other evidence for  chronic infarction. No susceptibility artifact to suggest acute or chronic intracranial hemorrhage. No mass lesion, midline shift or mass effect. No hydrocephalus. No extra-axial fluid collection. Major dural sinuses are patent. Pituitary gland suprasellar region within normal limits. Midline structures intact and normal. Vascular: Major intracranial vascular flow voids well maintained. Skull and upper cervical spine: Craniocervical junction normal. Upper cervical spine  within normal limits. Bone marrow signal intensity normal. No scalp soft tissue abnormality. Sinuses/Orbits: Globes oral soft tissues within normal limits. Paranasal sinuses are clear. No mastoid effusion. Inner ear structures normal. Other: None. IMPRESSION: 1. No acute intracranial abnormality. 2. Subcentimeter remote right thalamic lacunar infarct. 3. Mild age-related cerebral atrophy with chronic small vessel ischemic disease. Electronically Signed   By: Jeannine Boga M.D.   On: 09/09/2017 13:43    Microbiology: No results found for this or any previous visit (from the past 240 hour(s)).   Labs: Basic Metabolic Panel:  Recent Labs Lab 09/08/17 1840 09/09/17 0212 09/10/17 0530 09/11/17 0508  NA 137 139 140 140  K 4.3 4.8 4.7 4.1  CL 104 105 105 106  CO2 24 26 29 27   GLUCOSE 121* 88 91 82  BUN 20 16 11 10   CREATININE 1.41* 1.13 0.93 0.93  CALCIUM 9.3 9.5 9.4 9.3   Liver Function Tests: No results for input(s): AST, ALT, ALKPHOS, BILITOT, PROT, ALBUMIN in the last 168 hours. No results for input(s): LIPASE, AMYLASE in the last 168 hours. No results for input(s): AMMONIA in the last 168 hours. CBC:  Recent Labs Lab 09/08/17 1840 09/09/17 0212 09/10/17 0530 09/11/17 0508  WBC 5.7 4.6 3.6* 3.9*  NEUTROABS  --   --  1.6*  --   HGB 11.1* 11.1* 10.9* 10.3*  HCT 35.3* 34.5* 35.4* 33.4*  MCV 92.2 91.5 92.7 92.3  PLT 166 139* 135* 142*   Cardiac Enzymes: No results for input(s): CKTOTAL, CKMB, CKMBINDEX, TROPONINI in the last 168 hours. BNP: BNP (last 3 results) No results for input(s): BNP in the last 8760 hours.  ProBNP (last 3 results) No results for input(s): PROBNP in the last 8760 hours.  CBG: No results for input(s): GLUCAP in the last 168 hours.     SignedIrine Seal MD.  [facility] 09/11/2017, 5:47 PM

## 2017-09-11 NOTE — Progress Notes (Signed)
Bedside EEG completed, results pending. 

## 2018-01-13 ENCOUNTER — Other Ambulatory Visit: Payer: Self-pay

## 2018-01-13 ENCOUNTER — Encounter (HOSPITAL_COMMUNITY): Payer: Self-pay

## 2018-01-13 ENCOUNTER — Inpatient Hospital Stay (HOSPITAL_COMMUNITY)
Admission: EM | Admit: 2018-01-13 | Discharge: 2018-01-16 | DRG: 378 | Disposition: A | Discharge status: Home / Self Care | Payer: Medicare Other | Attending: Internal Medicine | Admitting: Internal Medicine

## 2018-01-13 DIAGNOSIS — Z86010 Personal history of colonic polyps: Secondary | ICD-10-CM

## 2018-01-13 DIAGNOSIS — R1013 Epigastric pain: Secondary | ICD-10-CM

## 2018-01-13 DIAGNOSIS — K920 Hematemesis: Secondary | ICD-10-CM | POA: Diagnosis not present

## 2018-01-13 DIAGNOSIS — D649 Anemia, unspecified: Secondary | ICD-10-CM | POA: Diagnosis not present

## 2018-01-13 DIAGNOSIS — D62 Acute posthemorrhagic anemia: Secondary | ICD-10-CM | POA: Diagnosis not present

## 2018-01-13 DIAGNOSIS — D696 Thrombocytopenia, unspecified: Secondary | ICD-10-CM | POA: Diagnosis not present

## 2018-01-13 DIAGNOSIS — E785 Hyperlipidemia, unspecified: Secondary | ICD-10-CM | POA: Diagnosis present

## 2018-01-13 DIAGNOSIS — K2970 Gastritis, unspecified, without bleeding: Secondary | ICD-10-CM | POA: Diagnosis not present

## 2018-01-13 DIAGNOSIS — Z8261 Family history of arthritis: Secondary | ICD-10-CM | POA: Diagnosis not present

## 2018-01-13 DIAGNOSIS — H409 Unspecified glaucoma: Secondary | ICD-10-CM | POA: Diagnosis present

## 2018-01-13 DIAGNOSIS — Z8673 Personal history of transient ischemic attack (TIA), and cerebral infarction without residual deficits: Secondary | ICD-10-CM | POA: Diagnosis not present

## 2018-01-13 DIAGNOSIS — K449 Diaphragmatic hernia without obstruction or gangrene: Secondary | ICD-10-CM | POA: Diagnosis not present

## 2018-01-13 DIAGNOSIS — K921 Melena: Secondary | ICD-10-CM | POA: Diagnosis not present

## 2018-01-13 DIAGNOSIS — R42 Dizziness and giddiness: Secondary | ICD-10-CM | POA: Diagnosis present

## 2018-01-13 DIAGNOSIS — K922 Gastrointestinal hemorrhage, unspecified: Secondary | ICD-10-CM

## 2018-01-13 DIAGNOSIS — M549 Dorsalgia, unspecified: Secondary | ICD-10-CM | POA: Diagnosis present

## 2018-01-13 DIAGNOSIS — Z7982 Long term (current) use of aspirin: Secondary | ICD-10-CM | POA: Diagnosis not present

## 2018-01-13 DIAGNOSIS — H9190 Unspecified hearing loss, unspecified ear: Secondary | ICD-10-CM | POA: Diagnosis present

## 2018-01-13 DIAGNOSIS — Z791 Long term (current) use of non-steroidal anti-inflammatories (NSAID): Secondary | ICD-10-CM | POA: Diagnosis not present

## 2018-01-13 DIAGNOSIS — I10 Essential (primary) hypertension: Secondary | ICD-10-CM | POA: Diagnosis not present

## 2018-01-13 DIAGNOSIS — M1990 Unspecified osteoarthritis, unspecified site: Secondary | ICD-10-CM | POA: Diagnosis present

## 2018-01-13 DIAGNOSIS — R710 Precipitous drop in hematocrit: Secondary | ICD-10-CM | POA: Diagnosis not present

## 2018-01-13 DIAGNOSIS — G8929 Other chronic pain: Secondary | ICD-10-CM | POA: Diagnosis present

## 2018-01-13 DIAGNOSIS — K254 Chronic or unspecified gastric ulcer with hemorrhage: Principal | ICD-10-CM | POA: Diagnosis present

## 2018-01-13 DIAGNOSIS — Z825 Family history of asthma and other chronic lower respiratory diseases: Secondary | ICD-10-CM

## 2018-01-13 DIAGNOSIS — K226 Gastro-esophageal laceration-hemorrhage syndrome: Secondary | ICD-10-CM | POA: Diagnosis not present

## 2018-01-13 DIAGNOSIS — Z8249 Family history of ischemic heart disease and other diseases of the circulatory system: Secondary | ICD-10-CM

## 2018-01-13 DIAGNOSIS — Z800 Family history of malignant neoplasm of digestive organs: Secondary | ICD-10-CM | POA: Diagnosis not present

## 2018-01-13 DIAGNOSIS — K210 Gastro-esophageal reflux disease with esophagitis: Secondary | ICD-10-CM | POA: Diagnosis not present

## 2018-01-13 DIAGNOSIS — I480 Paroxysmal atrial fibrillation: Secondary | ICD-10-CM | POA: Diagnosis present

## 2018-01-13 DIAGNOSIS — K259 Gastric ulcer, unspecified as acute or chronic, without hemorrhage or perforation: Secondary | ICD-10-CM | POA: Diagnosis not present

## 2018-01-13 DIAGNOSIS — K3189 Other diseases of stomach and duodenum: Secondary | ICD-10-CM | POA: Diagnosis not present

## 2018-01-13 LAB — PROTIME-INR
INR: 5.49
Prothrombin Time: 49.5 seconds — ABNORMAL HIGH (ref 11.4–15.2)

## 2018-01-13 LAB — CBC
HCT: 27.6 % — ABNORMAL LOW (ref 39.0–52.0)
Hemoglobin: 8.6 g/dL — ABNORMAL LOW (ref 13.0–17.0)
MCH: 28.3 pg (ref 26.0–34.0)
MCHC: 31.2 g/dL (ref 30.0–36.0)
MCV: 90.8 fL (ref 78.0–100.0)
Platelets: 167 10*3/uL (ref 150–400)
RBC: 3.04 MIL/uL — ABNORMAL LOW (ref 4.22–5.81)
RDW: 15.2 % (ref 11.5–15.5)
WBC: 11.3 10*3/uL — ABNORMAL HIGH (ref 4.0–10.5)

## 2018-01-13 LAB — COMPREHENSIVE METABOLIC PANEL
ALT: 8 U/L — ABNORMAL LOW (ref 17–63)
AST: 16 U/L (ref 15–41)
Albumin: 3.6 g/dL (ref 3.5–5.0)
Alkaline Phosphatase: 55 U/L (ref 38–126)
Anion gap: 11 (ref 5–15)
BUN: 60 mg/dL — ABNORMAL HIGH (ref 6–20)
CO2: 20 mmol/L — ABNORMAL LOW (ref 22–32)
Calcium: 8.9 mg/dL (ref 8.9–10.3)
Chloride: 108 mmol/L (ref 101–111)
Creatinine, Ser: 1.08 mg/dL (ref 0.61–1.24)
GFR calc Af Amer: >60 mL/min (ref >60)
GFR calc non Af Amer: >60 mL/min (ref >60)
Glucose, Bld: 95 mg/dL (ref 65–99)
Potassium: 4.2 mmol/L (ref 3.5–5.1)
Sodium: 139 mmol/L (ref 135–145)
Total Bilirubin: 0.5 mg/dL (ref 0.3–1.2)
Total Protein: 6.1 g/dL — ABNORMAL LOW (ref 6.5–8.1)

## 2018-01-13 LAB — URINALYSIS, ROUTINE W REFLEX MICROSCOPIC
Bilirubin Urine: NEGATIVE
Glucose, UA: NEGATIVE mg/dL
Hgb urine dipstick: NEGATIVE
Ketones, ur: NEGATIVE mg/dL
Leukocytes, UA: NEGATIVE
Nitrite: NEGATIVE
Protein, ur: NEGATIVE mg/dL
Specific Gravity, Urine: 1.018 (ref 1.005–1.030)
pH: 5 (ref 5.0–8.0)

## 2018-01-13 LAB — ABO/RH: ABO/RH(D): A POS

## 2018-01-13 LAB — POC OCCULT BLOOD, ED: Fecal Occult Bld: POSITIVE — AB

## 2018-01-13 LAB — LIPASE, BLOOD: Lipase: 31 U/L (ref 11–51)

## 2018-01-13 LAB — I-STAT TROPONIN, ED: Troponin i, poc: 0.01 ng/mL (ref 0.00–0.08)

## 2018-01-13 LAB — APTT: aPTT: 37 seconds — ABNORMAL HIGH (ref 24–36)

## 2018-01-13 MED ORDER — SODIUM CHLORIDE 0.9 % IV SOLN
8.0000 mg/h | INTRAVENOUS | Status: DC
  Administered 2018-01-13 – 2018-01-14 (×3): 8 mg/h via INTRAVENOUS
  Filled 2018-01-13 (×6): qty 80

## 2018-01-13 MED ORDER — PANTOPRAZOLE SODIUM 40 MG IV SOLR: 40.0000 mg | Freq: Two times a day (BID) | INTRAVENOUS | Status: DC

## 2018-01-13 MED ORDER — ACETAMINOPHEN 650 MG RE SUPP: 650.0000 mg | Freq: Four times a day (QID) | RECTAL | Status: DC | PRN

## 2018-01-13 MED ORDER — IPRATROPIUM BROMIDE 0.06 % NA SOLN
2.0000 | Freq: Every day | NASAL | Status: DC
  Administered 2018-01-14 – 2018-01-16 (×3): 2 via NASAL
  Filled 2018-01-13: qty 15

## 2018-01-13 MED ORDER — SODIUM CHLORIDE 0.9 % IV BOLUS (SEPSIS)
500.0000 mL | Freq: Once | INTRAVENOUS | Status: AC
  Administered 2018-01-13: 500 mL via INTRAVENOUS

## 2018-01-13 MED ORDER — ZOLPIDEM TARTRATE 5 MG PO TABS
10.0000 mg | ORAL_TABLET | Freq: Every evening | ORAL | Status: DC | PRN
  Administered 2018-01-13 – 2018-01-15 (×3): 10 mg via ORAL
  Filled 2018-01-13 (×3): qty 2

## 2018-01-13 MED ORDER — ATORVASTATIN CALCIUM 40 MG PO TABS
40.0000 mg | ORAL_TABLET | Freq: Every day | ORAL | Status: DC
  Administered 2018-01-14 – 2018-01-15 (×2): 40 mg via ORAL
  Filled 2018-01-13 (×2): qty 1

## 2018-01-13 MED ORDER — ACETAMINOPHEN 325 MG PO TABS
650.0000 mg | ORAL_TABLET | Freq: Four times a day (QID) | ORAL | Status: DC | PRN
  Administered 2018-01-16: 650 mg via ORAL
  Filled 2018-01-13: qty 2

## 2018-01-13 MED ORDER — PANTOPRAZOLE SODIUM 40 MG IV SOLR
40.0000 mg | Freq: Once | INTRAVENOUS | Status: AC
  Administered 2018-01-13: 40 mg via INTRAVENOUS
  Filled 2018-01-13: qty 40

## 2018-01-13 MED ORDER — ONDANSETRON HCL 4 MG PO TABS: 4.0000 mg | ORAL_TABLET | Freq: Four times a day (QID) | ORAL | Status: DC | PRN

## 2018-01-13 MED ORDER — ONDANSETRON HCL 4 MG/2ML IJ SOLN
4.0000 mg | Freq: Four times a day (QID) | INTRAMUSCULAR | Status: DC | PRN
  Administered 2018-01-15: 4 mg via INTRAVENOUS
  Filled 2018-01-13: qty 2

## 2018-01-13 MED ORDER — TRAVOPROST (BAK FREE) 0.004 % OP SOLN
1.0000 [drp] | Freq: Every day | OPHTHALMIC | Status: DC
  Administered 2018-01-14 – 2018-01-15 (×2): 1 [drp] via OPHTHALMIC
  Filled 2018-01-13: qty 2.5

## 2018-01-13 MED ORDER — BRIMONIDINE TARTRATE-TIMOLOL 0.2-0.5 % OP SOLN
1.0000 [drp] | Freq: Two times a day (BID) | OPHTHALMIC | Status: DC
  Filled 2018-01-13: qty 5

## 2018-01-13 MED ORDER — SODIUM CHLORIDE 0.9 % IV SOLN
INTRAVENOUS | Status: DC
Start: 2018-01-13 — End: 2018-01-16
  Administered 2018-01-13 – 2018-01-15 (×5): via INTRAVENOUS

## 2018-01-13 MED ORDER — HYDROCODONE-ACETAMINOPHEN 5-325 MG PO TABS
1.0000 | ORAL_TABLET | Freq: Two times a day (BID) | ORAL | Status: DC | PRN
  Administered 2018-01-14 – 2018-01-16 (×5): 1 via ORAL
  Filled 2018-01-13 (×5): qty 1

## 2018-01-13 NOTE — ED Triage Notes (Signed)
Pt arrives to ED from home with complaints of nausea and vomiting with bright red and then dark red blood since 0300. EMS reports pt also had multifocal bigeminy en route. Pt given zofran and fluids en route as well. Pt has had little water to drink today. Pt denies cardiac hx, does have hx of stroke. Pt placed in position of comfort with bed locked and lowered, call bell in reach.

## 2018-01-13 NOTE — ED Provider Notes (Signed)
[facility] EMERGENCY DEPARTMENT Provider Note   CSN: [id number] Arrival date & time: 01/13/18  1719     History   Chief Complaint Chief Complaint  Patient presents with  . Hematemesis  . Abnormal ECG    HPI Andrew Pacheco is a 77 y.o. male.  Patient with history of previous CVA diagnosed on MRI in 2018, chronic back pain --presents to the emergency department with complaints of dizziness and vomiting as well as blood in the vomit.  Patient states that he returned home from work early this morning and was very tired.  He woke from sleep and had an episode of vomiting with associated vertigo at approximately 3 AM.  He notes a small amount of blood in the vomit at that time.  He broke into a cold sweat prior.  No chest pain or shortness of breath.  He vomited again, a larger amount of blood estimated at up about a cup, at 6 AM.  Vertigo symptoms lasted throughout the morning into the afternoon.  He consulted with his primary care office who recommended he come to the emergency department.  Wife states that he was very weak and had difficulty walking and needed to hold onto objects. Patient denies other signs of stroke including: facial droop, slurred speech, aphasia, weakness/numbness in extremities.  Spinning sensation is gradually improved.  No further vomiting.  Patient has not noted any bloody or black stools.  Patient is not on anticoagulation however does admit to taking BC powder on a nearly daily basis as well as aspirin and meloxicam.  He has had ongoing epigastric pain which is worsened over the past several days.  No lightheadedness or syncope with symptoms.       Past Medical History:  Diagnosis Date  . Anemia   . Arthritis    "both hips, back" (09/09/2017)  . Chronic back pain    "middle and lower back" (09/09/2017)  . Colon polyps   . Daily headache   . Diverticulosis   . GERD (gastroesophageal reflux disease)   . Glaucoma, both eyes   . H pylori  ulcer   . Hard of hearing   . HTN (hypertension)   . 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 Lumbar disc disease   . Migraine    "q 3-4 months" (09/09/2017)  . Seasonal allergies     Patient Active Problem List   Diagnosis Date Noted  . Hematemesis 01/13/2018  . Acute blood loss anemia 01/13/2018  . Vasodepressor syncope 09/11/2017  . Vasovagal near syncope 09/11/2017  . Low vitamin B12 level 09/10/2017  . HTN (hypertension) 09/10/2017  . Malnutrition of moderate degree 09/10/2017  . Gastroesophageal reflux disease   . AKI (acute kidney injury) ([place])   . Glaucoma   . Near syncope 09/08/2017  . 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  . ANEMIA-UNSPECIFIED 09/26/2009  . CONSTIPATION 09/26/2009  . NAUSEA 09/26/2009  . ABDOMINAL PAIN -GENERALIZED 09/26/2009    Past Surgical History:  Procedure Laterality Date  . APPENDECTOMY    . CYST EXCISION Right ~ 1971   knee       Home Medications    Prior to Admission medications   Medication Sig Start Date End Date Taking? Authorizing Provider  amLODipine (NORVASC) 2.5 MG tablet Take 1 tablet (2.5 mg total) by mouth daily. 09/12/17  Yes Eugenie Filler, MD  aspirin EC 81 MG EC tablet Take 1 tablet (81 mg total) by mouth daily. 09/12/17  Yes Eugenie Filler,  MD  atorvastatin (LIPITOR) 40 MG tablet Take 1 tablet (40 mg total) by mouth daily at 6 PM. 09/11/17  Yes Eugenie Filler, MD  bisacodyl (DULCOLAX) 10 MG suppository Place 10 mg rectally as needed for moderate constipation.   Yes [provider]  COMBIGAN 0.2-0.5 % ophthalmic solution Place 1 drop into both eyes 2 (two) times daily. 01/10/18  Yes [provider]  feeding supplement, ENSURE ENLIVE, (ENSURE ENLIVE) LIQD Take 237 mLs by mouth 2 (two) times daily between meals. Patient taking differently: Take 237 mLs by mouth daily.  09/12/17  Yes Eugenie Filler, MD  HYDROcodone-acetaminophen (NORCO/VICODIN) 5-325 MG tablet Take 1 tablet by mouth 2 (two) times daily.  09/15/16  Yes  [provider]  ipratropium (ATROVENT) 0.06 % nasal spray Place 2 sprays into both nostrils daily.    Yes [provider]  senna (SENOKOT) 8.6 MG TABS tablet Take 3 tablets by mouth every 21 ( twenty-one) days.    Yes [provider]  travoprost, benzalkonium, (TRAVATAN) 0.004 % ophthalmic solution Place 1 drop into both eyes at bedtime.   Yes [provider]  vitamin B-12 (CYANOCOBALAMIN) 1000 MCG tablet Take 1 tablet (1,000 mcg total) by mouth daily. 09/11/17  Yes Eugenie Filler, MD  zolpidem (AMBIEN) 10 MG tablet Take 10 mg by mouth at bedtime.    Yes [provider]    Family History Family History  Problem Relation Age of Onset  . Heart disease Father   . Emphysema Father   . Diverticulosis Father   . Cancer Mother        gallbladder-mets  . Arthritis Mother   . Liver cancer Mother     Social History Social History   Tobacco Use  . Smoking status: Never Smoker  . Smokeless tobacco: Never Used  Substance Use Topics  . Alcohol use: Yes    Comment: 09/09/2017 "couple glasses of wine q 6 months"  . Drug use: No     Allergies   Patient has no known allergies.   Review of Systems Review of Systems  Constitutional: Negative for fever.  HENT: Negative for rhinorrhea and sore throat.   Eyes: Negative for redness.  Respiratory: Negative for cough.   Cardiovascular: Negative for chest pain.  Gastrointestinal: Positive for nausea and vomiting. Negative for abdominal pain, blood in stool and diarrhea.  Genitourinary: Negative for dysuria.  Musculoskeletal: Positive for gait problem. Negative for myalgias.  Skin: Negative for rash.  Neurological: Positive for dizziness. Negative for headaches.     Physical Exam Updated Vital Signs BP 110/62   Pulse 84   Temp 98.2 F (36.8 C) (Oral)   Resp 15   Ht 6' (1.829 m)   Wt 75 kg (165 lb 6.4 oz)   SpO2 100%   BMI 22.43 kg/m   Physical Exam  Constitutional: He is oriented  to person, place, and time. He appears well-developed and well-nourished.  HENT:  Head: Normocephalic and atraumatic.  Right Ear: Tympanic membrane, external ear and ear canal normal.  Left Ear: Tympanic membrane, external ear and ear canal normal.  Nose: Nose normal.  Mouth/Throat: Uvula is midline, oropharynx is clear and moist and mucous membranes are normal.  Eyes: Conjunctivae, EOM and lids are normal. Pupils are equal, round, and reactive to light. Right eye exhibits no discharge. Left eye exhibits no discharge.  Neck: Normal range of motion. Neck supple.  Cardiovascular: Normal rate, regular rhythm and normal heart sounds.  Pulmonary/Chest: Effort normal  and breath sounds normal.  Abdominal: Soft. Bowel sounds are normal. There is tenderness in the epigastric area. There is no rebound and no guarding.  Musculoskeletal: Normal range of motion.       Cervical back: He exhibits normal range of motion, no tenderness and no bony tenderness.  Neurological: He is alert and oriented to person, place, and time. He has normal strength and normal reflexes. No cranial nerve deficit or sensory deficit. He exhibits normal muscle tone. He displays a negative Romberg sign. Coordination and gait normal. GCS eye subscore is 4. GCS verbal subscore is 5. GCS motor subscore is 6.  Skin: Skin is warm and dry.  Psychiatric: He has a normal mood and affect.  Nursing note and vitals reviewed.    ED Treatments / Results  Labs (all labs ordered are listed, but only abnormal results are displayed) Labs Reviewed  COMPREHENSIVE METABOLIC PANEL - Abnormal; Notable for the following components:      Result Value   CO2 20 (*)    BUN 60 (*)    Total Protein 6.1 (*)    ALT 8 (*)    All other components within normal limits  CBC - Abnormal; Notable for the following components:   WBC 11.3 (*)    RBC 3.04 (*)    Hemoglobin 8.6 (*)    HCT 27.6 (*)    All other components within normal limits  URINALYSIS,  ROUTINE W REFLEX MICROSCOPIC - Abnormal; Notable for the following components:   Color, Urine STRAW (*)    All other components within normal limits  POC OCCULT BLOOD, ED - Abnormal; Notable for the following components:   Fecal Occult Bld POSITIVE (*)    All other components within normal limits  LIPASE, BLOOD  PROTIME-INR  APTT  CBC  BASIC METABOLIC PANEL  I-STAT TROPONIN, ED  TYPE AND SCREEN    ED ECG REPORT   Date: 01/13/2018  Rate: 86  Rhythm: normal sinus rhythm  QRS Axis: normal  Intervals: normal  ST/T Wave abnormalities: nonspecific ST/T changes  Conduction Disutrbances:right bundle branch block, ventricular trigeminy  Narrative Interpretation:   Old EKG Reviewed: trigeminy is new  I have personally reviewed the EKG tracing and agree with the computerized printout as noted.  Procedures Procedures (including critical care time)  Medications Ordered in ED Medications  pantoprazole (PROTONIX) 80 mg in sodium chloride 0.9 % 250 mL (0.32 mg/mL) infusion (not administered)  pantoprazole (PROTONIX) injection 40 mg (not administered)  pantoprazole (PROTONIX) injection 40 mg (not administered)  ipratropium (ATROVENT) 0.06 % nasal spray 2 spray (not administered)  brimonidine-timolol (COMBIGAN) 0.2-0.5 % ophthalmic solution 1 drop (not administered)  travoprost (benzalkonium) (TRAVATAN) 0.004 % ophthalmic solution 1 drop (not administered)  atorvastatin (LIPITOR) tablet 40 mg (not administered)  zolpidem (AMBIEN) tablet 10 mg (not administered)  HYDROcodone-acetaminophen (NORCO/VICODIN) 5-325 MG per tablet 1 tablet (not administered)  0.9 %  sodium chloride infusion (not administered)  acetaminophen (TYLENOL) tablet 650 mg (not administered)    Or  acetaminophen (TYLENOL) suppository 650 mg (not administered)  ondansetron (ZOFRAN) tablet 4 mg (not administered)    Or  ondansetron (ZOFRAN) injection 4 mg (not administered)  sodium chloride 0.9 % bolus 500 mL (not  administered)  pantoprazole (PROTONIX) injection 40 mg (40 mg Intravenous Given 01/13/18 1812)     Initial Impression / Assessment and Plan / ED Course  I have reviewed the triage vital signs and the nursing notes.  Pertinent labs & imaging results that were  available during my care of the patient were reviewed by me and considered in my medical decision making (see chart for details).     Patient seen and examined. Work-up initiated. Medications ordered. D/w Dr. Wilson Singer.  Neurologically, patient has a normal exam.  Vertigo reported earlier is completely resolved without treatment.  Vital signs reviewed and are as follows: BP 110/62   Pulse 84   Temp 98.2 F (36.8 C) (Oral)   Resp 15   Ht 6' (1.829 m)   Wt 75 kg (165 lb 6.4 oz)   SpO2 100%   BMI 22.43 kg/m   9:17 PM patient and family updated on results.  Will need admission for upper GI bleeding.  Discussed with Dr. Alcario Drought who will see patient.  Final Clinical Impressions(s) / ED Diagnoses   Final diagnoses:  Upper GI bleed  Epigastric abdominal pain  Vertigo   Admit.   ED Discharge Orders    None       Carlisle Cater, Hershal Coria 01/13/18 2118    Virgel Manifold, MD 01/14/18 [phone]

## 2018-01-13 NOTE — ED Notes (Signed)
ED Provider at bedside. 

## 2018-01-13 NOTE — ED Notes (Signed)
Pt unable to provide urine sample at this time, aware he needs to do so

## 2018-01-13 NOTE — H&P (Signed)
History and Physical    SAAJAN WILLMON AVW:[id number] DOB: February 18, 1941 DOA: 01/13/2018  PCP: [name], MD  Patient coming from: Home  I have personally briefly reviewed patient's old medical records in [place]  Chief Complaint: Hematemesis  HPI: Andrew Pacheco is a 76 y.o. 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  Patient appears to have a h/o h.pylori stomach ulcers back in 2010.  H/o iron def anemia.  Dr. Henrene Pastor is his gastroenterologist.  Patient does report taking NSAIDs for chronic back pain including BC Goody's powders BID chronically currently.  Patient presents to the ED with c/o hematemesis.  Onset 0300 today.  First episode a small amount of blood.  Second episode a cup of BRB.  Wasn't able to keep meds down.  Only had a few sips of water / Gatorade all day due to nausea.  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   ED Course: Hemoccult positive.  HGB 8.6 down from his 10-11 baseline.  Given 40 IV protonix.   Review of Systems: As per HPI otherwise 10 point review of systems negative.   Past Medical History:  Diagnosis Date  . Anemia   . Arthritis    "both hips, back" (09/09/2017)  . Chronic back pain    "middle and lower back" (09/09/2017)  . Colon polyps   . Daily headache   . Diverticulosis   . GERD (gastroesophageal reflux disease)   . Glaucoma, both eyes   . H pylori ulcer   . Hard of hearing   . HTN (hypertension)   . 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 Lumbar disc disease   . Migraine    "q 3-4 months" (09/09/2017)  . Seasonal allergies     Past Surgical History:  Procedure Laterality Date  . APPENDECTOMY    . CYST EXCISION Right ~ 1971   knee     reports that  has never smoked. he has never used smokeless tobacco. He reports that he drinks alcohol. He reports that he does not use drugs.  No Known Allergies  Family History    Problem Relation Age of Onset  . Heart disease Father   . Emphysema Father   . Diverticulosis Father   . Cancer Mother        gallbladder-mets  . Arthritis Mother   . Liver cancer Mother      Prior to Admission medications   Medication Sig Start Date End Date Taking? Authorizing Provider  amLODipine (NORVASC) 2.5 MG tablet Take 1 tablet (2.5 mg total) by mouth daily. 09/12/17  Yes Eugenie Filler, MD  aspirin EC 81 MG EC tablet Take 1 tablet (81 mg total) by mouth daily. 09/12/17  Yes Eugenie Filler, MD  atorvastatin (LIPITOR) 40 MG tablet Take 1 tablet (40 mg total) by mouth daily at 6 PM. 09/11/17  Yes Eugenie Filler, MD  bisacodyl (DULCOLAX) 10 MG suppository Place 10 mg rectally as needed for moderate constipation.   Yes [provider]  COMBIGAN 0.2-0.5 % ophthalmic solution Place 1 drop into both eyes 2 (two) times daily. 01/10/18  Yes [provider]  feeding supplement, ENSURE ENLIVE, (ENSURE ENLIVE) LIQD Take 237 mLs by mouth 2 (two) times daily between meals. Patient taking differently: Take 237 mLs by mouth daily.  09/12/17  Yes Eugenie Filler, MD  HYDROcodone-acetaminophen (NORCO/VICODIN) 5-325 MG tablet  Take 1 tablet by mouth 2 (two) times daily.  09/15/16  Yes [provider]  ipratropium (ATROVENT) 0.06 % nasal spray Place 2 sprays into both nostrils daily.    Yes [provider]  senna (SENOKOT) 8.6 MG TABS tablet Take 3 tablets by mouth every 21 ( twenty-one) days.    Yes [provider]  travoprost, benzalkonium, (TRAVATAN) 0.004 % ophthalmic solution Place 1 drop into both eyes at bedtime.   Yes [provider]  vitamin B-12 (CYANOCOBALAMIN) 1000 MCG tablet Take 1 tablet (1,000 mcg total) by mouth daily. 09/11/17  Yes Eugenie Filler, MD  zolpidem (AMBIEN) 10 MG tablet Take 10 mg by mouth at bedtime.    Yes [provider]    Physical Exam: Vitals:   01/13/18 1737 01/13/18 1745 01/13/18 1800  01/13/18 1830  BP:  135/76 128/70 110/62  Pulse:  76 91 84  Resp:  18 17 15   Temp:      TempSrc:      SpO2:  97% 98% 100%  Weight: 75 kg (165 lb 6.4 oz)     Height: 6' (1.829 m)       Constitutional: NAD, calm, comfortable Eyes: PERRL, lids and conjunctivae normal ENMT: Mucous membranes are moist. Posterior pharynx clear of any exudate or lesions.Normal dentition.  Neck: normal, supple, no masses, no thyromegaly Respiratory: clear to auscultation bilaterally, no wheezing, no crackles. Normal respiratory effort. No accessory muscle use.  Cardiovascular: Regular rate and rhythm, no murmurs / rubs / gallops. No extremity edema. 2+ pedal pulses. No carotid bruits.  Abdomen: no tenderness, no masses palpated. No hepatosplenomegaly. Bowel sounds positive.  Musculoskeletal: no clubbing / cyanosis. No joint deformity upper and lower extremities. Good ROM, no contractures. Normal muscle tone.  Skin: no rashes, lesions, ulcers. No induration Neurologic: CN 2-12 grossly intact. Sensation intact, DTR normal. Strength 5/5 in all 4.  Psychiatric: Normal judgment and insight. Alert and oriented x 3. Normal mood.    Labs on Admission: I have personally reviewed following labs and imaging studies  CBC: Recent Labs  Lab 01/13/18 1748  WBC 11.3*  HGB 8.6*  HCT 27.6*  MCV 90.8  PLT 681   Basic Metabolic Panel: Recent Labs  Lab 01/13/18 1748  NA 139  K 4.2  CL 108  CO2 20*  GLUCOSE 95  BUN 60*  CREATININE 1.08  CALCIUM 8.9   GFR: Estimated Creatinine Clearance: 58.8 mL/min (by C-G formula based on SCr of 1.08 mg/dL). Liver Function Tests: Recent Labs  Lab 01/13/18 1748  AST 16  ALT 8*  ALKPHOS 55  BILITOT 0.5  PROT 6.1*  ALBUMIN 3.6   Recent Labs  Lab 01/13/18 1748  LIPASE 31   No results for input(s): AMMONIA in the last 168 hours. Coagulation Profile: No results for input(s): INR, PROTIME in the last 168 hours. Cardiac Enzymes: No results for input(s): CKTOTAL,  CKMB, CKMBINDEX, TROPONINI in the last 168 hours. BNP (last 3 results) No results for input(s): PROBNP in the last 8760 hours. HbA1C: No results for input(s): HGBA1C in the last 72 hours. CBG: No results for input(s): GLUCAP in the last 168 hours. Lipid Profile: No results for input(s): CHOL, HDL, LDLCALC, TRIG, CHOLHDL, LDLDIRECT in the last 72 hours. Thyroid Function Tests: No results for input(s): TSH, T4TOTAL, FREET4, T3FREE, THYROIDAB in the last 72 hours. Anemia Panel: No results for input(s): VITAMINB12, FOLATE, FERRITIN, TIBC, IRON, RETICCTPCT in the last 72 hours. Urine analysis:    Component Value  Date/Time   COLORURINE STRAW (A) 01/13/2018 1909   APPEARANCEUR CLEAR 01/13/2018 1909   LABSPEC 1.018 01/13/2018 1909   PHURINE 5.0 01/13/2018 1909   GLUCOSEU NEGATIVE 01/13/2018 1909   HGBUR NEGATIVE 01/13/2018 1909   BILIRUBINUR NEGATIVE 01/13/2018 1909   KETONESUR NEGATIVE 01/13/2018 1909   PROTEINUR NEGATIVE 01/13/2018 1909   NITRITE NEGATIVE 01/13/2018 1909   LEUKOCYTESUR NEGATIVE 01/13/2018 1909    Radiological Exams on Admission: No results found.  EKG: Independently reviewed.  Assessment/Plan Principal Problem:   Hematemesis Active Problems:   HTN (hypertension)   Acute blood loss anemia    1. Hematemesis - with acute blood loss anemia, NSAID ulcer vs recurrent h.pylori ulcers, vs other. 1. PPI GTT 2. NPO except sips with meds 3. Hold ASA 4. Stop all NSAIDs 5. IVF: 500cc bolus and 125 cc/hr 6. Repeat CBC in AM 7. Repeat BMP in AM to monitor kidney function.  BUN of 60 noted but creat without change.  BUN elevation be due to digestion of blood products vs AKI / pre-renal. 8. Call GI in AM for probable upper EGD as next step.  Patient of Dr. Blanch Media 2. HTN - 1. Hold home BP meds  DVT prophylaxis: SCDs Code Status: Full Family Communication: Wife at bedside Disposition Plan: Home after admit Consults called: None Admission status: Admit to inpatient  - inpatient status for UGI bleed with hematemesis.   Etta Quill DO [facility] Pager [phone]  If 7AM-7PM, please contact day team taking care of patient [email or web address] Password [facility]  01/13/2018, 8:46 PM

## 2018-01-14 DIAGNOSIS — Z791 Long term (current) use of non-steroidal anti-inflammatories (NSAID): Secondary | ICD-10-CM

## 2018-01-14 DIAGNOSIS — D649 Anemia, unspecified: Secondary | ICD-10-CM

## 2018-01-14 DIAGNOSIS — R710 Precipitous drop in hematocrit: Secondary | ICD-10-CM

## 2018-01-14 DIAGNOSIS — K920 Hematemesis: Secondary | ICD-10-CM

## 2018-01-14 LAB — HEPATIC FUNCTION PANEL
ALT: 9 U/L — ABNORMAL LOW (ref 17–63)
AST: 13 U/L — ABNORMAL LOW (ref 15–41)
Albumin: 3 g/dL — ABNORMAL LOW (ref 3.5–5.0)
Alkaline Phosphatase: 49 U/L (ref 38–126)
Bilirubin, Direct: <0.1 mg/dL — ABNORMAL LOW (ref 0.1–0.5)
Total Bilirubin: 0.5 mg/dL (ref 0.3–1.2)
Total Protein: 5.2 g/dL — ABNORMAL LOW (ref 6.5–8.1)

## 2018-01-14 LAB — CBC
HCT: 23.2 % — ABNORMAL LOW (ref 39.0–52.0)
HEMOGLOBIN: 7.6 g/dL — AB (ref 13.0–17.0)
MCH: 29.7 pg (ref 26.0–34.0)
MCHC: 32.8 g/dL (ref 30.0–36.0)
MCV: 90.6 fL (ref 78.0–100.0)
Platelets: 141 10*3/uL — ABNORMAL LOW (ref 150–400)
RBC: 2.56 MIL/uL — AB (ref 4.22–5.81)
RDW: 15.5 % (ref 11.5–15.5)
WBC: 6.7 10*3/uL (ref 4.0–10.5)

## 2018-01-14 LAB — DIC (DISSEMINATED INTRAVASCULAR COAGULATION)PANEL
D-Dimer, Quant: 0.33 ug/mL-FEU (ref 0.00–0.50)
Fibrinogen: 335 mg/dL (ref 210–475)
INR: 1.14
Platelets: 136 10*3/uL — ABNORMAL LOW (ref 150–400)
Prothrombin Time: 14.6 s (ref 11.4–15.2)
Smear Review: NONE SEEN
aPTT: 31 s (ref 24–36)

## 2018-01-14 LAB — BASIC METABOLIC PANEL
ANION GAP: 9 (ref 5–15)
BUN: 41 mg/dL — AB (ref 6–20)
CHLORIDE: 112 mmol/L — AB (ref 101–111)
CO2: 20 mmol/L — ABNORMAL LOW (ref 22–32)
Calcium: 8.6 mg/dL — ABNORMAL LOW (ref 8.9–10.3)
Creatinine, Ser: 1.14 mg/dL (ref 0.61–1.24)
GFR calc Af Amer: >60 mL/min (ref >60)
GFR calc non Af Amer: 59 mL/min — ABNORMAL LOW (ref >60)
Glucose, Bld: 91 mg/dL (ref 65–99)
POTASSIUM: 4.1 mmol/L (ref 3.5–5.1)
SODIUM: 141 mmol/L (ref 135–145)

## 2018-01-14 LAB — APTT: aPTT: 32 s (ref 24–36)

## 2018-01-14 LAB — HEMOGLOBIN AND HEMATOCRIT, BLOOD
HCT: 23.8 % — ABNORMAL LOW (ref 39.0–52.0)
HCT: 21.4 % — ABNORMAL LOW (ref 39.0–52.0)
Hemoglobin: 7.5 g/dL — ABNORMAL LOW (ref 13.0–17.0)
Hemoglobin: 6.8 g/dL — CL (ref 13.0–17.0)

## 2018-01-14 LAB — PROTIME-INR
INR: 1.18
Prothrombin Time: 15 seconds (ref 11.4–15.2)

## 2018-01-14 LAB — PREPARE RBC (CROSSMATCH)

## 2018-01-14 MED ORDER — BRIMONIDINE TARTRATE 0.2 % OP SOLN
1.0000 [drp] | Freq: Two times a day (BID) | OPHTHALMIC | Status: DC
  Administered 2018-01-14 – 2018-01-16 (×5): 1 [drp] via OPHTHALMIC
  Filled 2018-01-14 (×3): qty 5

## 2018-01-14 MED ORDER — SODIUM CHLORIDE 0.9 % IV SOLN
Freq: Once | INTRAVENOUS | Status: AC
  Administered 2018-01-14: 16:00:00 via INTRAVENOUS

## 2018-01-14 MED ORDER — TIMOLOL MALEATE 0.5 % OP SOLN
1.0000 [drp] | Freq: Two times a day (BID) | OPHTHALMIC | Status: DC
  Administered 2018-01-14 – 2018-01-16 (×5): 1 [drp] via OPHTHALMIC
  Filled 2018-01-14: qty 5

## 2018-01-14 NOTE — Progress Notes (Signed)
PROGRESS NOTE    Andrew Pacheco  XNA:[id number] DOB: 02/01/1941 DOA: 01/13/2018 PCP: [name], MD   Brief Narrative: Andrew Pacheco is a 77 year old man with a past medical history relevant for possible paroxysmal atrial fibrillation, hyperlipidemia, hypertension, arthritis, chronic back pain who was admitted with several episodes of hematemesis.   Assessment & Plan:   Principal Problem:   Hematemesis Active Problems:   HTN (hypertension)   Acute blood loss anemia   ##) Hematemsis: Patient reports a long history of taking approximately 2-4 packets of BC powder daily.  He uses this rather than his Norco for his chronic arthritis pain in his hips and his back.  Suspect that the most likely source of his hematemesis is ulcers secondary to NSAID use. -Continue PPI drip -GI consult -Monitor H&H every 6 hours transfuse for hemoglobin less than 7 -Fluids  ##) Elevated INR: Suspect that this is likely a lab error.  He is not on any vitamin K antagonists or an oral novel oral anticoagulants. -Recheck -DIC panel and LFTs are all normal  ##) Paroxysmal atrial fibrillation: -Hold aspirin 81 mg daily  ##) HLD: -Continue atorvastatin 40 mg daily  ##) HTN: -Hold amlodipine 2.5 mg daily   FEN: -fluids: IVF - electrolytes: monitor and supp - nutrition: clears  Consultants:   GI  Procedures: (Don't include imaging studies which can be auto populated. Include things that cannot be auto populated i.e. Echo, Carotid and venous dopplers, Foley, Bipap, HD, tubes/drains, wound vac, central lines etc)  Pending EGD  Antimicrobials: (specify start and planned stop date. Auto populated tables are space occupying and do not give end dates)  none    Subjective: Patient reports that he has been taking chronic BC powders for his arthritis pain.  He reports vague chronic upper abdominal pain for several months.  He denies taking any anticoagulation, he denies any use of synthetic  marijuana, he denies any easy bruising or bleeding bleeding other than senile purpura.  Objective: Vitals:   01/13/18 2300 01/14/18 0017 01/14/18 0615 01/14/18 0931  BP: (!) 94/48 101/73 108/82 124/66  Pulse: 81 85 88 78  Resp: 19 20 19 20   Temp:  99.1 F (37.3 C) 98.9 F (37.2 C) 98.6 F (37 C)  TempSrc:  Oral Oral Oral  SpO2: 97% 99% 100% 100%  Weight:  75.2 kg (165 lb 12.6 oz)    Height:  6' (1.829 m)      Intake/Output Summary (Last 24 hours) at 01/14/2018 1523 Last data filed at 01/14/2018 1310 Gross per 24 hour  Intake 2131.67 ml  Output 1500 ml  Net 631.67 ml   Filed Weights   01/13/18 1737 01/14/18 0017  Weight: 75 kg (165 lb 6.4 oz) 75.2 kg (165 lb 12.6 oz)    Examination:  General exam: Appears calm and comfortable  Respiratory system: Clear to auscultation. Respiratory effort normal. Cardiovascular system: S1 & S2 heard, No pedal edema. Gastrointestinal system: Abdomen is nondistended, soft and nontender. +BS, mildly tender to palpation in epigastric area Central nervous system: Alert and oriented. No focal neurological deficits. Extremities: No lower extremity edema Skin: Senile purpura noted Psychiatry: Judgement and insight appear normal.    Data Reviewed: I have personally reviewed following labs and imaging studies  CBC: Recent Labs  Lab 01/13/18 1748 01/14/18 0447 01/14/18 0823 01/14/18 1206  WBC 11.3* 6.7  --   --   HGB 8.6* 7.6* 7.5* 6.8*  HCT 27.6* 23.2* 23.8* 21.4*  MCV 90.8 90.6  --   --  PLT 167 141* 136*  --    Basic Metabolic Panel: Recent Labs  Lab 01/13/18 1748 01/14/18 0447  NA 139 141  K 4.2 4.1  CL 108 112*  CO2 20* 20*  GLUCOSE 95 91  BUN 60* 41*  CREATININE 1.08 1.14  CALCIUM 8.9 8.6*   GFR: Estimated Creatinine Clearance: 55.9 mL/min (by C-G formula based on SCr of 1.14 mg/dL). Liver Function Tests: Recent Labs  Lab 01/13/18 1748 01/14/18 0823  AST 16 13*  ALT 8* 9*  ALKPHOS 55 49  BILITOT 0.5 0.5  PROT  6.1* 5.2*  ALBUMIN 3.6 3.0*   Recent Labs  Lab 01/13/18 1748  LIPASE 31   No results for input(s): AMMONIA in the last 168 hours. Coagulation Profile: Recent Labs  Lab 01/13/18 1956 01/14/18 0823 01/14/18 1256  INR 5.49* 1.14 1.18   Cardiac Enzymes: No results for input(s): CKTOTAL, CKMB, CKMBINDEX, TROPONINI in the last 168 hours. BNP (last 3 results) No results for input(s): PROBNP in the last 8760 hours. HbA1C: No results for input(s): HGBA1C in the last 72 hours. CBG: No results for input(s): GLUCAP in the last 168 hours. Lipid Profile: No results for input(s): CHOL, HDL, LDLCALC, TRIG, CHOLHDL, LDLDIRECT in the last 72 hours. Thyroid Function Tests: No results for input(s): TSH, T4TOTAL, FREET4, T3FREE, THYROIDAB in the last 72 hours. Anemia Panel: No results for input(s): VITAMINB12, FOLATE, FERRITIN, TIBC, IRON, RETICCTPCT in the last 72 hours. Sepsis Labs: No results for input(s): PROCALCITON, LATICACIDVEN in the last 168 hours.  No results found for this or any previous visit (from the past 240 hour(s)).       Radiology Studies: No results found.      Scheduled Meds: . atorvastatin  40 mg Oral q1800  . brimonidine  1 drop Both Eyes BID   And  . timolol  1 drop Both Eyes BID  . ipratropium  2 spray Each Nare Daily  . [START ON 01/17/2018] pantoprazole  40 mg Intravenous Q12H  . Travoprost (BAK Free)  1 drop Both Eyes QHS   Continuous Infusions: . sodium chloride 125 mL/hr at 01/14/18 0909  . sodium chloride    . pantoprozole (PROTONIX) infusion 8 mg/hr (01/14/18 0909)     LOS: 1 day    Time spent: [place], MD [facility]  If 7PM-7AM, please contact night-coverage [email or web address] Password [facility] 01/14/2018, 3:23 PM

## 2018-01-14 NOTE — Consult Note (Addendum)
Referring Provider: [facility]  Primary Care Physician:  [name], MD Primary Gastroenterologist:  Scarlette Shorts,  MD  Reason for Consultation:  UGI bleed    Attending physician's note   I have taken a history, examined the patient and reviewed the chart. I agree with the Advanced Practitioner's note, impression and recommendations. Hematemesis and ABL anemia in setting of heavy NSAID usage. Transfuse to keep Hb > 7. IV PPI gtt. Clears ok today then NPO past MN for EGD tomorrow morning.  Discontinue NSAID usage long term.   Lucio Edward, MD [facility] [phone] Mon-Fri 8a-5p [phone] after 5p, weekends, holidays   ASSESSMENT AND PLAN:    66.  77 year old male admitted with hematemesis and acute, severe normocytic anemia with drop in hgb from 11 to 6.8 in setting of excessive NSAIDS.  -He will need EGD tomorrow. The risks and benefits of EGD were discussed and the patient agrees to proceed.  -Getting blood transfusion now.  -continue PPI gtt.  -NPO after MN.   2. Multifocal Bigeminy on way to hospital.    HPI: Andrew Pacheco is a 77 y.o. male admitted with hematemesis. Early Friday morning he had an episode of hematemesis. This was followed several hours later by another episode of a large amount of blood.  He was brought to the ED by EMS.  In route he had multifocal bigeminy.  No diarrhea or black stools.  Patient has been having upper abdominal discomfort for several weeks.  He takes BC/Goody powders on a regular basis and has done so for years for pain.  He was recently started on an aspirin as well.  He takes omeprazole but not on a daily basis.    Patient has history of peptic ulcer disease.  In 2010 he had multiple antral ulcers on EGD . He also has a history of iron deficiency anemia worked up by [place] in 2015.  EGD was normal.  Colonoscopy was normal except for left-sided diverticulosis. The capsule study was incomplete.  There was possible duodenal ulcers with inflammation.   A small amount of fresh blood was seen at 7 minutes corresponding to the duodenum  Patient has baseline hemoglobin is around 11.  Yesterday was 8.6 and today down to 6.8.  He is normocytic.  Platelets slightly low this is a chronic problem.  INR is normal   Past Medical History:  Diagnosis Date  . Anemia   . Arthritis    "both hips, back" (09/09/2017)  . Chronic back pain    "middle and lower back" (09/09/2017)  . Colon polyps   . Daily headache   . Diverticulosis   . GERD (gastroesophageal reflux disease)   . Glaucoma, both eyes   . H pylori ulcer   . Hard of hearing   . HTN (hypertension)   . 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 Lumbar disc disease   . Migraine    "q 3-4 months" (09/09/2017)  . Seasonal allergies     Past Surgical History:  Procedure Laterality Date  . APPENDECTOMY    . CYST EXCISION Right ~ 1971   knee    Prior to Admission medications   Medication Sig Start Date End Date Taking? Authorizing Provider  amLODipine (NORVASC) 2.5 MG tablet Take 1 tablet (2.5 mg total) by mouth daily. 09/12/17  Yes Eugenie Filler, MD  aspirin EC 81 MG EC tablet Take 1 tablet (81 mg total) by mouth daily. 09/12/17  Yes Eugenie Filler, MD  atorvastatin (LIPITOR) 40 MG tablet Take 1 tablet (40 mg total) by mouth daily at 6 PM. 09/11/17  Yes Eugenie Filler, MD  bisacodyl (DULCOLAX) 10 MG suppository Place 10 mg rectally as needed for moderate constipation.   Yes [provider]  COMBIGAN 0.2-0.5 % ophthalmic solution Place 1 drop into both eyes 2 (two) times daily. 01/10/18  Yes [provider]  feeding supplement, ENSURE ENLIVE, (ENSURE ENLIVE) LIQD Take 237 mLs by mouth 2 (two) times daily between meals. Patient taking differently: Take 237 mLs by mouth daily.  09/12/17  Yes Eugenie Filler, MD  HYDROcodone-acetaminophen (NORCO/VICODIN) 5-325 MG tablet Take 1 tablet by mouth 2 (two) times daily.  09/15/16  Yes [provider]  ipratropium (ATROVENT)  0.06 % nasal spray Place 2 sprays into both nostrils daily.    Yes [provider]  senna (SENOKOT) 8.6 MG TABS tablet Take 3 tablets by mouth every 21 ( twenty-one) days.    Yes [provider]  travoprost, benzalkonium, (TRAVATAN) 0.004 % ophthalmic solution Place 1 drop into both eyes at bedtime.   Yes [provider]  vitamin B-12 (CYANOCOBALAMIN) 1000 MCG tablet Take 1 tablet (1,000 mcg total) by mouth daily. 09/11/17  Yes Eugenie Filler, MD  zolpidem (AMBIEN) 10 MG tablet Take 10 mg by mouth at bedtime.    Yes [provider]    Current Facility-Administered Medications  Medication Dose Route Frequency Provider Last Rate Last Dose  . 0.9 %  sodium chloride infusion   Intravenous Continuous Etta Quill, DO 125 mL/hr at 01/14/18 0909    . 0.9 %  sodium chloride infusion   Intravenous Once Purohit, Shrey C, MD      . acetaminophen (TYLENOL) tablet 650 mg  650 mg Oral Q6H PRN Etta Quill, DO       Or  . acetaminophen (TYLENOL) suppository 650 mg  650 mg Rectal Q6H PRN Etta Quill, DO      . atorvastatin (LIPITOR) tablet 40 mg  40 mg Oral q1800 Etta Quill, DO      . brimonidine (ALPHAGAN) 0.2 % ophthalmic solution 1 drop  1 drop Both Eyes BID Jennette Kettle M, DO   1 drop at 01/14/18 1035   And  . timolol (TIMOPTIC) 0.5 % ophthalmic solution 1 drop  1 drop Both Eyes BID Etta Quill, DO   1 drop at 01/14/18 1036  . HYDROcodone-acetaminophen (NORCO/VICODIN) 5-325 MG per tablet 1 tablet  1 tablet Oral BID PRN Etta Quill, DO   1 tablet at 01/14/18 1049  . ipratropium (ATROVENT) 0.06 % nasal spray 2 spray  2 spray Each Nare Daily Etta Quill, DO   2 spray at 01/14/18 1035  . ondansetron (ZOFRAN) tablet 4 mg  4 mg Oral Q6H PRN Etta Quill, DO       Or  . ondansetron [facility]) injection 4 mg  4 mg Intravenous Q6H PRN Etta Quill, DO      . pantoprazole (PROTONIX) 80 mg in sodium chloride 0.9 % 250 mL (0.32 mg/mL)  infusion  8 mg/hr Intravenous Continuous Etta Quill, DO 25 mL/hr at 01/14/18 0909 8 mg/hr at 01/14/18 0909  . [START ON 01/17/2018] pantoprazole (PROTONIX) injection 40 mg  40 mg Intravenous Q12H Etta Quill, DO      . Travoprost (BAK Free) (TRAVATAN) 0.004 % ophthalmic solution SOLN 1 drop  1 drop Both Eyes QHS Etta Quill, DO      .  zolpidem (AMBIEN) tablet 10 mg  10 mg Oral QHS PRN Etta Quill, DO   10 mg at 01/13/18 2121    Allergies as of 01/13/2018  . (No Known Allergies)    Family History  Problem Relation Age of Onset  . Heart disease Father   . Emphysema Father   . Diverticulosis Father   . Cancer Mother        gallbladder-mets  . Arthritis Mother   . Liver cancer Mother     Social History   Socioeconomic History  . Marital status: Married    Spouse name: Not on file  . Number of children: 2  . Years of education: Not on file  . Highest education level: Not on file  Social Needs  . Financial resource strain: Not on file  . Food insecurity - worry: Not on file  . Food insecurity - inability: Not on file  . Transportation needs - medical: Not on file  . Transportation needs - non-medical: Not on file  Occupational History  . Occupation: security guard - [employer]  Tobacco Use  . Smoking status: Never Smoker  . Smokeless tobacco: Never Used  Substance and Sexual Activity  . Alcohol use: Yes    Comment: 09/09/2017 "couple glasses of wine q 6 months"  . Drug use: No  . Sexual activity: Not on file  Other Topics Concern  . Not on file  Social History Narrative  . Not on file    Review of Systems: All systems reviewed and negative except where noted in HPI.  Physical Exam: Vital signs in last 24 hours: Temp:  [98.2 F (36.8 C)-99.1 F (37.3 C)] 98.6 F (37 C) (01/26 0931) Pulse Rate:  [74-91] 78 (01/26 0931) Resp:  [15-23] 20 (01/26 0931) BP: (94-140)/(48-82) 124/66 (01/26 0931) SpO2:  [97 %-100 %] 100 % (01/26  0931) Weight:  [165 lb 6.4 oz (75 kg)-165 lb 12.6 oz (75.2 kg)] 165 lb 12.6 oz (75.2 kg) (01/26 0017) Last BM Date: 01/13/18 General:   Alert, well-developed,  White male in NAD Psych:  Pleasant, cooperative. Normal mood and affect. Eyes:  Pupils equal, sclera clear, no icterus.   Conjunctiva pink. Ears:  Normal auditory acuity. Nose:  No deformity, discharge,  or lesions. Neck:  Supple; no masses Lungs:  Clear throughout to auscultation.   No wheezes, crackles, or rhonchi.  Heart:  Regular rate and rhythm; no murmurs, no edema Abdomen:  Soft, non-distended, nontender, BS active, no palp mass    Rectal:  Deferred  Msk:  Symmetrical without gross deformities. . Neurologic:  Alert and  oriented x4;  grossly normal neurologically. Skin:  Intact without significant lesions or rashes..   Intake/Output from previous day: 01/25 0701 - 01/26 0700 In: 2131.7 [I.V.:1631.7; IV Piggyback:500] Out: 750 [Urine:750] Intake/Output this shift: Total I/O In: 0  Out: 750 [Urine:750]  Lab Results: Recent Labs    01/13/18 1748 01/14/18 0447 01/14/18 0823 01/14/18 1206  WBC 11.3* 6.7  --   --   HGB 8.6* 7.6* 7.5* 6.8*  HCT 27.6* 23.2* 23.8* 21.4*  PLT 167 141* 136*  --    BMET Recent Labs    01/13/18 1748 01/14/18 0447  NA 139 141  K 4.2 4.1  CL 108 112*  CO2 20* 20*  GLUCOSE 95 91  BUN 60* 41*  CREATININE 1.08 1.14  CALCIUM 8.9 8.6*   LFT Recent Labs    01/14/18 0823  PROT 5.2*  ALBUMIN 3.0*  AST  13*  ALT 9*  ALKPHOS 49  BILITOT 0.5  BILIDIR <0.1*  IBILI NOT CALCULATED   PT/INR Recent Labs    01/14/18 0823 01/14/18 1256  LABPROT 14.6 15.0  INR 1.14 1.18    Studies/Results: No results found.   Tye Savoy, NP-C @  01/14/2018, 3:36 PM  Pager number [phone]

## 2018-01-14 NOTE — H&P (View-Only) (Signed)
Referring Provider: [facility]  Primary Care Physician:  [name], MD Primary Gastroenterologist:  Scarlette Shorts,  MD  Reason for Consultation:  UGI bleed    Attending physician's note   I have taken a history, examined the patient and reviewed the chart. I agree with the Advanced Practitioner's note, impression and recommendations. Hematemesis and ABL anemia in setting of heavy NSAID usage. Transfuse to keep Hb > 7. IV PPI gtt. Clears ok today then NPO past MN for EGD tomorrow morning.  Discontinue NSAID usage long term.   Lucio Edward, MD [facility] [phone] Mon-Fri 8a-5p [phone] after 5p, weekends, holidays   ASSESSMENT AND PLAN:    62.  77 year old male admitted with hematemesis and acute, severe normocytic anemia with drop in hgb from 11 to 6.8 in setting of excessive NSAIDS.  -He will need EGD tomorrow. The risks and benefits of EGD were discussed and the patient agrees to proceed.  -Getting blood transfusion now.  -continue PPI gtt.  -NPO after MN.   2. Multifocal Bigeminy on way to hospital.    HPI: GENEVIEVE RITZEL is a 77 y.o. male admitted with hematemesis. Early Friday morning he had an episode of hematemesis. This was followed several hours later by another episode of a large amount of blood.  He was brought to the ED by EMS.  In route he had multifocal bigeminy.  No diarrhea or black stools.  Patient has been having upper abdominal discomfort for several weeks.  He takes BC/Goody powders on a regular basis and has done so for years for pain.  He was recently started on an aspirin as well.  He takes omeprazole but not on a daily basis.    Patient has history of peptic ulcer disease.  In 2010 he had multiple antral ulcers on EGD . He also has a history of iron deficiency anemia worked up by [place] in 2015.  EGD was normal.  Colonoscopy was normal except for left-sided diverticulosis. The capsule study was incomplete.  There was possible duodenal ulcers with inflammation.   A small amount of fresh blood was seen at 7 minutes corresponding to the duodenum  Patient has baseline hemoglobin is around 11.  Yesterday was 8.6 and today down to 6.8.  He is normocytic.  Platelets slightly low this is a chronic problem.  INR is normal   Past Medical History:  Diagnosis Date  . Anemia   . Arthritis    "both hips, back" (09/09/2017)  . Chronic back pain    "middle and lower back" (09/09/2017)  . Colon polyps   . Daily headache   . Diverticulosis   . GERD (gastroesophageal reflux disease)   . Glaucoma, both eyes   . H pylori ulcer   . Hard of hearing   . HTN (hypertension)   . 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 Lumbar disc disease   . Migraine    "q 3-4 months" (09/09/2017)  . Seasonal allergies     Past Surgical History:  Procedure Laterality Date  . APPENDECTOMY    . CYST EXCISION Right ~ 1971   knee    Prior to Admission medications   Medication Sig Start Date End Date Taking? Authorizing Provider  amLODipine (NORVASC) 2.5 MG tablet Take 1 tablet (2.5 mg total) by mouth daily. 09/12/17  Yes Eugenie Filler, MD  aspirin EC 81 MG EC tablet Take 1 tablet (81 mg total) by mouth daily. 09/12/17  Yes Eugenie Filler, MD  atorvastatin (LIPITOR) 40 MG tablet Take 1 tablet (40 mg total) by mouth daily at 6 PM. 09/11/17  Yes Eugenie Filler, MD  bisacodyl (DULCOLAX) 10 MG suppository Place 10 mg rectally as needed for moderate constipation.   Yes [provider]  COMBIGAN 0.2-0.5 % ophthalmic solution Place 1 drop into both eyes 2 (two) times daily. 01/10/18  Yes [provider]  feeding supplement, ENSURE ENLIVE, (ENSURE ENLIVE) LIQD Take 237 mLs by mouth 2 (two) times daily between meals. Patient taking differently: Take 237 mLs by mouth daily.  09/12/17  Yes Eugenie Filler, MD  HYDROcodone-acetaminophen (NORCO/VICODIN) 5-325 MG tablet Take 1 tablet by mouth 2 (two) times daily.  09/15/16  Yes [provider]  ipratropium (ATROVENT)  0.06 % nasal spray Place 2 sprays into both nostrils daily.    Yes [provider]  senna (SENOKOT) 8.6 MG TABS tablet Take 3 tablets by mouth every 21 ( twenty-one) days.    Yes [provider]  travoprost, benzalkonium, (TRAVATAN) 0.004 % ophthalmic solution Place 1 drop into both eyes at bedtime.   Yes [provider]  vitamin B-12 (CYANOCOBALAMIN) 1000 MCG tablet Take 1 tablet (1,000 mcg total) by mouth daily. 09/11/17  Yes Eugenie Filler, MD  zolpidem (AMBIEN) 10 MG tablet Take 10 mg by mouth at bedtime.    Yes [provider]    Current Facility-Administered Medications  Medication Dose Route Frequency Provider Last Rate Last Dose  . 0.9 %  sodium chloride infusion   Intravenous Continuous Etta Quill, DO 125 mL/hr at 01/14/18 0909    . 0.9 %  sodium chloride infusion   Intravenous Once Purohit, Shrey C, MD      . acetaminophen (TYLENOL) tablet 650 mg  650 mg Oral Q6H PRN Etta Quill, DO       Or  . acetaminophen (TYLENOL) suppository 650 mg  650 mg Rectal Q6H PRN Etta Quill, DO      . atorvastatin (LIPITOR) tablet 40 mg  40 mg Oral q1800 Etta Quill, DO      . brimonidine (ALPHAGAN) 0.2 % ophthalmic solution 1 drop  1 drop Both Eyes BID Jennette Kettle M, DO   1 drop at 01/14/18 1035   And  . timolol (TIMOPTIC) 0.5 % ophthalmic solution 1 drop  1 drop Both Eyes BID Etta Quill, DO   1 drop at 01/14/18 1036  . HYDROcodone-acetaminophen (NORCO/VICODIN) 5-325 MG per tablet 1 tablet  1 tablet Oral BID PRN Etta Quill, DO   1 tablet at 01/14/18 1049  . ipratropium (ATROVENT) 0.06 % nasal spray 2 spray  2 spray Each Nare Daily Etta Quill, DO   2 spray at 01/14/18 1035  . ondansetron (ZOFRAN) tablet 4 mg  4 mg Oral Q6H PRN Etta Quill, DO       Or  . ondansetron [facility] injection 4 mg  4 mg Intravenous Q6H PRN Etta Quill, DO      . pantoprazole (PROTONIX) 80 mg in sodium chloride 0.9 % 250 mL (0.32 mg/mL)  infusion  8 mg/hr Intravenous Continuous Etta Quill, DO 25 mL/hr at 01/14/18 0909 8 mg/hr at 01/14/18 0909  . [START ON 01/17/2018] pantoprazole (PROTONIX) injection 40 mg  40 mg Intravenous Q12H Etta Quill, DO      . Travoprost (BAK Free) (TRAVATAN) 0.004 % ophthalmic solution SOLN 1 drop  1 drop Both Eyes QHS Etta Quill, DO      .  zolpidem (AMBIEN) tablet 10 mg  10 mg Oral QHS PRN Etta Quill, DO   10 mg at 01/13/18 2121    Allergies as of 01/13/2018  . (No Known Allergies)    Family History  Problem Relation Age of Onset  . Heart disease Father   . Emphysema Father   . Diverticulosis Father   . Cancer Mother        gallbladder-mets  . Arthritis Mother   . Liver cancer Mother     Social History   Socioeconomic History  . Marital status: Married    Spouse name: Not on file  . Number of children: 2  . Years of education: Not on file  . Highest education level: Not on file  Social Needs  . Financial resource strain: Not on file  . Food insecurity - worry: Not on file  . Food insecurity - inability: Not on file  . Transportation needs - medical: Not on file  . Transportation needs - non-medical: Not on file  Occupational History  . Occupation: security guard - [employer]  Tobacco Use  . Smoking status: Never Smoker  . Smokeless tobacco: Never Used  Substance and Sexual Activity  . Alcohol use: Yes    Comment: 09/09/2017 "couple glasses of wine q 6 months"  . Drug use: No  . Sexual activity: Not on file  Other Topics Concern  . Not on file  Social History Narrative  . Not on file    Review of Systems: All systems reviewed and negative except where noted in HPI.  Physical Exam: Vital signs in last 24 hours: Temp:  [98.2 F (36.8 C)-99.1 F (37.3 C)] 98.6 F (37 C) (01/26 0931) Pulse Rate:  [74-91] 78 (01/26 0931) Resp:  [15-23] 20 (01/26 0931) BP: (94-140)/(48-82) 124/66 (01/26 0931) SpO2:  [97 %-100 %] 100 % (01/26  0931) Weight:  [165 lb 6.4 oz (75 kg)-165 lb 12.6 oz (75.2 kg)] 165 lb 12.6 oz (75.2 kg) (01/26 0017) Last BM Date: 01/13/18 General:   Alert, well-developed,  White male in NAD Psych:  Pleasant, cooperative. Normal mood and affect. Eyes:  Pupils equal, sclera clear, no icterus.   Conjunctiva pink. Ears:  Normal auditory acuity. Nose:  No deformity, discharge,  or lesions. Neck:  Supple; no masses Lungs:  Clear throughout to auscultation.   No wheezes, crackles, or rhonchi.  Heart:  Regular rate and rhythm; no murmurs, no edema Abdomen:  Soft, non-distended, nontender, BS active, no palp mass    Rectal:  Deferred  Msk:  Symmetrical without gross deformities. . Neurologic:  Alert and  oriented x4;  grossly normal neurologically. Skin:  Intact without significant lesions or rashes..   Intake/Output from previous day: 01/25 0701 - 01/26 0700 In: 2131.7 [I.V.:1631.7; IV Piggyback:500] Out: 750 [Urine:750] Intake/Output this shift: Total I/O In: 0  Out: 750 [Urine:750]  Lab Results: Recent Labs    01/13/18 1748 01/14/18 0447 01/14/18 0823 01/14/18 1206  WBC 11.3* 6.7  --   --   HGB 8.6* 7.6* 7.5* 6.8*  HCT 27.6* 23.2* 23.8* 21.4*  PLT 167 141* 136*  --    BMET Recent Labs    01/13/18 1748 01/14/18 0447  NA 139 141  K 4.2 4.1  CL 108 112*  CO2 20* 20*  GLUCOSE 95 91  BUN 60* 41*  CREATININE 1.08 1.14  CALCIUM 8.9 8.6*   LFT Recent Labs    01/14/18 0823  PROT 5.2*  ALBUMIN 3.0*  AST  13*  ALT 9*  ALKPHOS 49  BILITOT 0.5  BILIDIR <0.1*  IBILI NOT CALCULATED   PT/INR Recent Labs    01/14/18 0823 01/14/18 1256  LABPROT 14.6 15.0  INR 1.14 1.18    Studies/Results: No results found.   Tye Savoy, NP-C @  01/14/2018, 3:36 PM  Pager number [phone]

## 2018-01-15 ENCOUNTER — Encounter (HOSPITAL_COMMUNITY)
Admission: EM | Disposition: A | Discharge status: Home / Self Care | Payer: Self-pay | Source: Home / Self Care | Attending: Internal Medicine

## 2018-01-15 ENCOUNTER — Encounter (HOSPITAL_COMMUNITY): Payer: Self-pay | Admitting: Gastroenterology

## 2018-01-15 DIAGNOSIS — K922 Gastrointestinal hemorrhage, unspecified: Secondary | ICD-10-CM

## 2018-01-15 DIAGNOSIS — K2970 Gastritis, unspecified, without bleeding: Secondary | ICD-10-CM

## 2018-01-15 DIAGNOSIS — K449 Diaphragmatic hernia without obstruction or gangrene: Secondary | ICD-10-CM

## 2018-01-15 DIAGNOSIS — K3189 Other diseases of stomach and duodenum: Secondary | ICD-10-CM

## 2018-01-15 DIAGNOSIS — K210 Gastro-esophageal reflux disease with esophagitis: Secondary | ICD-10-CM

## 2018-01-15 DIAGNOSIS — K226 Gastro-esophageal laceration-hemorrhage syndrome: Secondary | ICD-10-CM

## 2018-01-15 DIAGNOSIS — K259 Gastric ulcer, unspecified as acute or chronic, without hemorrhage or perforation: Secondary | ICD-10-CM

## 2018-01-15 DIAGNOSIS — K921 Melena: Secondary | ICD-10-CM | POA: Diagnosis not present

## 2018-01-15 HISTORY — PX: ESOPHAGOGASTRODUODENOSCOPY: SHX5428

## 2018-01-15 LAB — TYPE AND SCREEN
ABO/RH(D): A POS
ANTIBODY SCREEN: NEGATIVE
UNIT DIVISION: 0
UNIT DIVISION: 0

## 2018-01-15 LAB — CBC
HCT: 27.1 % — ABNORMAL LOW (ref 39.0–52.0)
Hemoglobin: 8.9 g/dL — ABNORMAL LOW (ref 13.0–17.0)
MCH: 29.4 pg (ref 26.0–34.0)
MCHC: 32.8 g/dL (ref 30.0–36.0)
MCV: 89.4 fL (ref 78.0–100.0)
Platelets: 120 10*3/uL — ABNORMAL LOW (ref 150–400)
RBC: 3.03 MIL/uL — ABNORMAL LOW (ref 4.22–5.81)
RDW: 16.5 % — ABNORMAL HIGH (ref 11.5–15.5)
WBC: 4.1 10*3/uL (ref 4.0–10.5)

## 2018-01-15 LAB — BASIC METABOLIC PANEL WITH GFR
BUN: 22 mg/dL — ABNORMAL HIGH (ref 6–20)
CO2: 21 mmol/L — ABNORMAL LOW (ref 22–32)
Chloride: 112 mmol/L — ABNORMAL HIGH (ref 101–111)
Creatinine, Ser: 0.95 mg/dL (ref 0.61–1.24)
Sodium: 140 mmol/L (ref 135–145)

## 2018-01-15 LAB — HEMOGLOBIN AND HEMATOCRIT, BLOOD
HCT: 27.5 % — ABNORMAL LOW (ref 39.0–52.0)
HCT: 27.8 % — ABNORMAL LOW (ref 39.0–52.0)
HCT: 27.1 % — ABNORMAL LOW (ref 39.0–52.0)
HCT: 27.8 % — ABNORMAL LOW (ref 39.0–52.0)
HCT: 28.6 % — ABNORMAL LOW (ref 39.0–52.0)
Hemoglobin: 8.9 g/dL — ABNORMAL LOW (ref 13.0–17.0)
Hemoglobin: 8.9 g/dL — ABNORMAL LOW (ref 13.0–17.0)
Hemoglobin: 8.7 g/dL — ABNORMAL LOW (ref 13.0–17.0)
Hemoglobin: 9.2 g/dL — ABNORMAL LOW (ref 13.0–17.0)
Hemoglobin: 9.2 g/dL — ABNORMAL LOW (ref 13.0–17.0)

## 2018-01-15 LAB — BASIC METABOLIC PANEL
Anion gap: 7 (ref 5–15)
Calcium: 8.4 mg/dL — ABNORMAL LOW (ref 8.9–10.3)
GFR calc Af Amer: >60 mL/min (ref >60)
GFR calc non Af Amer: >60 mL/min (ref >60)
Glucose, Bld: 81 mg/dL (ref 65–99)
Potassium: 4.2 mmol/L (ref 3.5–5.1)

## 2018-01-15 LAB — BPAM RBC
Blood Product Expiration Date: 201902132359
Blood Product Expiration Date: 201902132359
ISSUE DATE / TIME: 201901261507
ISSUE DATE / TIME: 201901262106
UNIT TYPE AND RH: 6200
Unit Type and Rh: 6200

## 2018-01-15 SURGERY — EGD (ESOPHAGOGASTRODUODENOSCOPY)
Anesthesia: Moderate Sedation

## 2018-01-15 MED ORDER — FENTANYL CITRATE (PF) 100 MCG/2ML IJ SOLN
INTRAMUSCULAR | Status: AC
  Filled 2018-01-15: qty 4

## 2018-01-15 MED ORDER — VITAMIN B-12 1000 MCG PO TABS
1000.0000 ug | ORAL_TABLET | Freq: Every day | ORAL | Status: DC
  Administered 2018-01-15 – 2018-01-16 (×2): 1000 ug via ORAL
  Filled 2018-01-15 (×2): qty 1

## 2018-01-15 MED ORDER — AMLODIPINE BESYLATE 5 MG PO TABS
2.5000 mg | ORAL_TABLET | Freq: Every day | ORAL | Status: DC
  Administered 2018-01-15 – 2018-01-16 (×2): 2.5 mg via ORAL
  Filled 2018-01-15 (×2): qty 1

## 2018-01-15 MED ORDER — SODIUM CHLORIDE 0.9 % IV SOLN
INTRAVENOUS | Status: AC | PRN
  Administered 2018-01-15: 500 mL via INTRAMUSCULAR

## 2018-01-15 MED ORDER — SENNA 8.6 MG PO TABS
3.0000 | ORAL_TABLET | ORAL | Status: DC
  Administered 2018-01-16: 25.8 mg via ORAL
  Filled 2018-01-15: qty 3

## 2018-01-15 MED ORDER — PANTOPRAZOLE SODIUM 40 MG PO TBEC
40.0000 mg | DELAYED_RELEASE_TABLET | Freq: Two times a day (BID) | ORAL | Status: DC
  Administered 2018-01-15 – 2018-01-16 (×3): 40 mg via ORAL
  Filled 2018-01-15 (×2): qty 1

## 2018-01-15 MED ORDER — FENTANYL CITRATE (PF) 100 MCG/2ML IJ SOLN
INTRAMUSCULAR | Status: DC | PRN
  Administered 2018-01-15 (×2): 25 ug via INTRAVENOUS

## 2018-01-15 MED ORDER — ASPIRIN EC 81 MG PO TBEC: 81.0000 mg | DELAYED_RELEASE_TABLET | Freq: Every day | ORAL | Status: DC

## 2018-01-15 MED ORDER — MIDAZOLAM HCL 5 MG/ML IJ SOLN
INTRAMUSCULAR | Status: AC
  Filled 2018-01-15: qty 3

## 2018-01-15 MED ORDER — MIDAZOLAM HCL 10 MG/2ML IJ SOLN
INTRAMUSCULAR | Status: DC | PRN
  Administered 2018-01-15 (×2): 2 mg via INTRAVENOUS

## 2018-01-15 NOTE — Interval H&P Note (Signed)
History and Physical Interval Note:  01/15/2018 10:50 AM  Andrew Pacheco  has presented today for surgery, with the diagnosis of hematemesis  The various methods of treatment have been discussed with the patient and family. After consideration of risks, benefits and other options for treatment, the patient has consented to  Procedure(s): ESOPHAGOGASTRODUODENOSCOPY (EGD) (N/A) as a surgical intervention .  The patient's history has been reviewed, patient examined, no change in status, stable for surgery.  I have reviewed the patient's chart and labs.  Questions were answered to the patient's satisfaction.     Pricilla Riffle. Fuller Plan

## 2018-01-15 NOTE — Progress Notes (Signed)
PROGRESS NOTE    Andrew Pacheco  KKX:[id number] DOB: [birth year] DOA: 01/13/2018 PCP: [name], MD   Brief Narrative: Mr. Pewitt is a 77 year old man with a past medical history relevant for possible paroxysmal atrial fibrillation, hyperlipidemia, hypertension, arthritis, chronic back pain who was admitted with several episodes of hematemesis with EGD done on 01/15/2018 showing large cratered gastric ulcer is likely source.   Assessment & Plan:   Principal Problem:   Hematemesis Active Problems:   HTN (hypertension)   Acute blood loss anemia   Upper GI bleed   Melena   ##) Hematemsis: That is post 2 units transfusion on 01/14/1999.  EGD on 01/15/2018 showed large cratered gastric ulcer as source. -Transition to Protonix 40 mg twice daily per GI recommendations -GI consult appreciate recommendations -We will check hemoglobin tomorrow and discharged on oral Protonix  ##) Elevated INR: Resolved lab error   ##) Paroxysmal atrial fibrillation: -Restart aspirin 81 mg daily  ##) HLD: -Continue atorvastatin 40 mg daily  ##) HTN: -Start amlodipine 2.5 mg daily    FEN: -fluids: IVF - electrolytes: monitor and supp - nutrition: AHA diet  Consultants:   GI  Procedures: (Don't include imaging studies which can be auto populated. Include things that cannot be auto populated i.e. Echo, Carotid and venous dopplers, Foley, Bipap, HD, tubes/drains, wound vac, central lines etc)  EGD 01/15/18: LA Grade A (one or more mucosal breaks less than 5 mm, not extending       between tops of 2 mucosal folds) esophagitis with no bleeding was found       at the gastroesophageal junction.      The exam of the esophagus was otherwise normal.      A 14 mm non-bleeding Mallory-Weiss tear with no stigmata of recent       bleeding was found.      A small hiatal hernia was present.      One non-bleeding cratered gastric ulcer with no stigmata of bleeding was       found in the gastric antrum. The  lesion was 10 mm in largest dimension.       Biopsies were taken with a cold forceps for Helicobacter pylori testing.      A benign-appearing, intrinsic severe stenosis was found in the gastric       antrum associated with the ulcer. This was traversed.      The exam of the stomach was otherwise normal.      The duodenal bulb and second portion of the duodenum were normal.   Antimicrobials: (specify start and planned stop date. Auto populated tables are space occupying and do not give end dates)  none    Subjective: Patient reports that he has been taking chronic BC powders for his arthritis pain.  He reports vague chronic upper abdominal pain for several months.  He denies taking any anticoagulation, he denies any use of synthetic marijuana, he denies any easy bruising or bleeding bleeding other than senile purpura.  Objective: Vitals:   01/15/18 1135 01/15/18 1137 01/15/18 1138 01/15/18 1140  BP:  (!) 97/41 129/69   Pulse: 73 72 65 65  Resp: 17 (!) 21 16 17   Temp:      TempSrc:      SpO2: 94% 95% 97% 96%  Weight:      Height:        Intake/Output Summary (Last 24 hours) at 01/15/2018 1258 Last data filed at 01/15/2018 0900 Gross per 24 hour  Intake  2422.5 ml  Output 1150 ml  Net 1272.5 ml   Filed Weights   01/13/18 1737 01/14/18 0017 01/14/18 2023  Weight: 75 kg (165 lb 6.4 oz) 75.2 kg (165 lb 12.6 oz) 75.3 kg (166 lb 0.1 oz)    Examination:  General exam: Appears calm and comfortable  Respiratory system: Clear to auscultation. Respiratory effort normal. Cardiovascular system: S1 & S2 heard, No pedal edema. Gastrointestinal system: Abdomen is nondistended, soft and nontender. +BS, mildly tender to palpation in epigastric area Central nervous system: Alert and oriented. No focal neurological deficits. Extremities: No lower extremity edema Skin: Senile purpura noted Psychiatry: Judgement and insight appear normal.    Data Reviewed: I have personally reviewed  following labs and imaging studies  CBC: Recent Labs  Lab 01/13/18 1748 01/14/18 0447 01/14/18 0823 01/14/18 1206 01/15/18 0421 01/15/18 0812 01/15/18 1215  WBC 11.3* 6.7  --   --  4.1  --   --   HGB 8.6* 7.6* 7.5* 6.8* 8.9*  8.9* 8.9* 8.7*  HCT 27.6* 23.2* 23.8* 21.4* 27.1*  27.5* 27.8* 27.1*  MCV 90.8 90.6  --   --  89.4  --   --   PLT 167 141* 136*  --  120*  --   --    Basic Metabolic Panel: Recent Labs  Lab 01/13/18 1748 01/14/18 0447 01/15/18 0421  NA 139 141 140  K 4.2 4.1 4.2  CL 108 112* 112*  CO2 20* 20* 21*  GLUCOSE 95 91 81  BUN 60* 41* 22*  CREATININE 1.08 1.14 0.95  CALCIUM 8.9 8.6* 8.4*   GFR: Estimated Creatinine Clearance: 67.2 mL/min (by C-G formula based on SCr of 0.95 mg/dL). Liver Function Tests: Recent Labs  Lab 01/13/18 1748 01/14/18 0823  AST 16 13*  ALT 8* 9*  ALKPHOS 55 49  BILITOT 0.5 0.5  PROT 6.1* 5.2*  ALBUMIN 3.6 3.0*   Recent Labs  Lab 01/13/18 1748  LIPASE 31   No results for input(s): AMMONIA in the last 168 hours. Coagulation Profile: Recent Labs  Lab 01/13/18 1956 01/14/18 0823 01/14/18 1256  INR 5.49* 1.14 1.18   Cardiac Enzymes: No results for input(s): CKTOTAL, CKMB, CKMBINDEX, TROPONINI in the last 168 hours. BNP (last 3 results) No results for input(s): PROBNP in the last 8760 hours. HbA1C: No results for input(s): HGBA1C in the last 72 hours. CBG: No results for input(s): GLUCAP in the last 168 hours. Lipid Profile: No results for input(s): CHOL, HDL, LDLCALC, TRIG, CHOLHDL, LDLDIRECT in the last 72 hours. Thyroid Function Tests: No results for input(s): TSH, T4TOTAL, FREET4, T3FREE, THYROIDAB in the last 72 hours. Anemia Panel: No results for input(s): VITAMINB12, FOLATE, FERRITIN, TIBC, IRON, RETICCTPCT in the last 72 hours. Sepsis Labs: No results for input(s): PROCALCITON, LATICACIDVEN in the last 168 hours.  No results found for this or any previous visit (from the past 240 hour(s)).        Radiology Studies: No results found.      Scheduled Meds: . atorvastatin  40 mg Oral q1800  . brimonidine  1 drop Both Eyes BID   And  . timolol  1 drop Both Eyes BID  . ipratropium  2 spray Each Nare Daily  . pantoprazole  40 mg Oral BID AC  . Travoprost (BAK Free)  1 drop Both Eyes QHS   Continuous Infusions: . sodium chloride 125 mL/hr at 01/14/18 2105     LOS: 2 days    Time spent: 30  Cristy Folks, MD [facility]  If 7PM-7AM, please contact night-coverage [email or web address] Password [facility] 01/15/2018, 12:58 PM

## 2018-01-15 NOTE — Op Note (Signed)
[facility] Patient Name: Andrew Pacheco Procedure Date : 01/15/2018 [name]: [id number] Attending MD: Ladene Artist , MD Date of Birth: 10-03-1941 CSN: [id number] Age: 77 Admit Type: Inpatient Procedure:                Upper GI endoscopy Indications:              Hematemesis, Melena Providers:                Pricilla Riffle. Fuller Plan, MD, Angus Seller, Tinnie Gens,                            Technician Referring MD:             [facility] Medicines:                Fentanyl 50 micrograms IV, Midazolam 4 mg IV Complications:            No immediate complications. Estimated Blood Loss:     Estimated blood loss was minimal. Procedure:                Pre-Anesthesia Assessment:                           - Prior to the procedure, a History and Physical                            was performed, and patient medications and                            allergies were reviewed. The patient's tolerance of                            previous anesthesia was also reviewed. The risks                            and benefits of the procedure and the sedation                            options and risks were discussed with the patient.                            All questions were answered, and informed consent                            was obtained. Prior Anticoagulants: The patient has                            taken no previous anticoagulant or antiplatelet                            agents. ASA Grade Assessment: II - A patient with                            mild systemic disease. After reviewing the risks  and benefits, the patient was deemed in                            satisfactory condition to undergo the procedure.                           After obtaining informed consent, the endoscope was                            passed under direct vision. Throughout the                            procedure, the patient's blood pressure, pulse, and          oxygen saturations were monitored continuously. The                            EG-[id number] ([id number]) scope was introduced through the                            mouth, and advanced to the second part of duodenum.                            The upper GI endoscopy was accomplished without                            difficulty. The patient tolerated the procedure                            well. Scope In: Scope Out: Findings:      LA Grade A (one or more mucosal breaks less than 5 mm, not extending       between tops of 2 mucosal folds) esophagitis with no bleeding was found       at the gastroesophageal junction.      The exam of the esophagus was otherwise normal.      A 14 mm non-bleeding Mallory-Weiss tear with no stigmata of recent       bleeding was found.      A small hiatal hernia was present.      One non-bleeding cratered gastric ulcer with no stigmata of bleeding was       found in the gastric antrum. The lesion was 10 mm in largest dimension.       Biopsies were taken with a cold forceps for Helicobacter pylori testing.      A benign-appearing, intrinsic severe stenosis was found in the gastric       antrum associated with the ulcer. This was traversed.      The exam of the stomach was otherwise normal.      The duodenal bulb and second portion of the duodenum were normal. Impression:               - LA Grade A reflux esophagitis.                           - Mallory-Weiss tear.                           -  Small hiatal hernia.                           - Non-bleeding gastric ulcer with no stigmata of                            bleeding. Biopsied.                           - Gastric stenosis associated with the ulcer was                            found in the gastric antrum.                           - Normal duodenal bulb and second portion of the                            duodenum. Moderate Sedation:      Moderate (conscious) sedation was administered by the endoscopy  nurse       and supervised by the endoscopist. The following parameters were       monitored: oxygen saturation, heart rate, blood pressure, respiratory       rate, EKG, adequacy of pulmonary ventilation, and response to care.       Total physician intraservice time was 11 minutes. Recommendation:           - Return patient to hospital ward for ongoing care.                           - Full liquid diet today.                           - Protonix (pantoprazole) 40 mg PO BID.                           - Await pathology results.                           - Return to GI office in 1 month, Dr. Scarlette Shorts.                            Consider repeat EGD to document ulcer healing.                           - No aspirin, ibuprofen, naproxen, or other                            non-steroidal anti-inflammatory drugs long term. Procedure Code(s):        --- Professional ---                           [phone], Esophagogastroduodenoscopy, flexible,                            transoral; with biopsy, single or multiple  38182, Moderate sedation services provided by the                            same physician or other qualified health care                            professional performing the diagnostic or                            therapeutic service that the sedation supports,                            requiring the presence of an independent trained                            observer to assist in the monitoring of the                            patient's level of consciousness and physiological                            status; initial 15 minutes of intraservice time,                            patient age 23 years or older Diagnosis Code(s):        --- Professional ---                           K21.0, Gastro-esophageal reflux disease with                            esophagitis                           K22.6, Gastro-esophageal laceration-hemorrhage                             syndrome                           K44.9, Diaphragmatic hernia without obstruction or                            gangrene                           K25.9, Gastric ulcer, unspecified as acute or                            chronic, without hemorrhage or perforation                           K31.89, Other diseases of stomach and duodenum                           K92.0, Hematemesis  K92.1, Melena (includes Hematochezia) CPT copyright 2016 American Medical Association. All rights reserved. The codes documented in this report are preliminary and upon coder review may  be revised to meet current compliance requirements. Ladene Artist, MD 01/15/2018 11:25:48 AM This report has been signed electronically. Number of Addenda: 0

## 2018-01-16 ENCOUNTER — Encounter: Payer: Self-pay | Admitting: Internal Medicine

## 2018-01-16 ENCOUNTER — Encounter (HOSPITAL_COMMUNITY): Payer: Self-pay | Admitting: *Deleted

## 2018-01-16 DIAGNOSIS — K922 Gastrointestinal hemorrhage, unspecified: Secondary | ICD-10-CM

## 2018-01-16 DIAGNOSIS — D62 Acute posthemorrhagic anemia: Secondary | ICD-10-CM

## 2018-01-16 DIAGNOSIS — D696 Thrombocytopenia, unspecified: Secondary | ICD-10-CM

## 2018-01-16 LAB — CBC
HCT: 25.4 % — ABNORMAL LOW (ref 39.0–52.0)
Hemoglobin: 8.3 g/dL — ABNORMAL LOW (ref 13.0–17.0)
MCH: 29.7 pg (ref 26.0–34.0)
MCHC: 32.7 g/dL (ref 30.0–36.0)
MCV: 91 fL (ref 78.0–100.0)
Platelets: 107 10*3/uL — ABNORMAL LOW (ref 150–400)
RBC: 2.79 MIL/uL — ABNORMAL LOW (ref 4.22–5.81)
RDW: 16.5 % — ABNORMAL HIGH (ref 11.5–15.5)
WBC: 3.4 10*3/uL — ABNORMAL LOW (ref 4.0–10.5)

## 2018-01-16 LAB — HEMOGLOBIN AND HEMATOCRIT, BLOOD
HCT: 24.6 % — ABNORMAL LOW (ref 39.0–52.0)
Hemoglobin: 8 g/dL — ABNORMAL LOW (ref 13.0–17.0)

## 2018-01-16 MED ORDER — PANTOPRAZOLE SODIUM 40 MG PO TBEC: 40.0000 mg | DELAYED_RELEASE_TABLET | Freq: Two times a day (BID) | ORAL | 1 refills | Status: DC

## 2018-01-16 MED ORDER — TRAMADOL HCL 50 MG PO TABS: 50.0000 mg | ORAL_TABLET | Freq: Four times a day (QID) | ORAL | 0 refills | Status: AC | PRN

## 2018-01-16 NOTE — Care Management Note (Signed)
Case Management Note  Patient Details  Name: Andrew Pacheco [name]: [id number] Date of Birth: [birth year]  Subjective/Objective:       Admitted for upper GI Bleed.           Action/Plan: Patient received 2 units packed red blood cells on 01/14/2018.  Patient was started on Protonix infusion until EGD on 01/15/2018. Patient was strongly discouraged to take any NSAIDs including BC powder.  Expected Discharge Date:  01/16/18               Expected Discharge Plan:  Home/Self Care  Discharge planning Services  CM Consult  Status of Service:  Completed, signed off  Additional Comments: Prior to admission pt lived at home with spouse.  PCP is [name].  Uses [facility] on [address].  Pt for discharge home today.  No discharge needs noted.  Royetta Crochet. Mady Gemma, BSN, RN Nurse case manager [phone] 01/16/2018, 2:31 PM

## 2018-01-16 NOTE — Discharge Instructions (Signed)
Tramadol tablets What is this medicine? TRAMADOL (TRA ma dole) is a pain reliever. It is used to treat moderate to severe pain in adults. This medicine may be used for other purposes; ask your health care provider or pharmacist if you have questions. COMMON BRAND NAME(S): Ultram What should I tell my health care provider before I take this medicine? They need to know if you have any of these conditions: -brain tumor -depression -drug abuse or addiction -head injury -if you frequently drink alcohol containing drinks -kidney disease or trouble passing urine -liver disease -lung disease, asthma, or breathing problems -seizures or epilepsy -suicidal thoughts, plans, or attempt; a previous suicide attempt by you or a family member -an unusual or allergic reaction to tramadol, codeine, other medicines, foods, dyes, or preservatives -pregnant or trying to get pregnant -breast-feeding How should I use this medicine? Take this medicine by mouth with a full glass of water. Follow the directions on the prescription label. You can take it with or without food. If it upsets your stomach, take it with food. Do not take your medicine more often than directed. A special MedGuide will be given to you by the pharmacist with each prescription and refill. Be sure to read this information carefully each time. Talk to your pediatrician regarding the use of this medicine in children. Special care may be needed. Overdosage: If you think you have taken too much of this medicine contact a poison control center or emergency room at once. NOTE: This medicine is only for you. Do not share this medicine with others. What if I miss a dose? If you miss a dose, take it as soon as you can. If it is almost time for your next dose, take only that dose. Do not take double or extra doses. What may interact with this medicine? Do not take this medication with any of the following medicines: -MAOIs like Carbex, Eldepryl,  Marplan, Nardil, and Parnate This medicine may also interact with the following medications: -alcohol -antihistamines for allergy, cough and cold -certain medicines for anxiety or sleep -certain medicines for depression like amitriptyline, fluoxetine, sertraline -certain medicines for migraine headache like almotriptan, eletriptan, frovatriptan, naratriptan, rizatriptan, sumatriptan, zolmitriptan -certain medicines for seizures like carbamazepine, oxcarbazepine, phenobarbital, primidone -certain medicines that treat or prevent blood clots like warfarin -digoxin -furazolidone -general anesthetics like halothane, isoflurane, methoxyflurane, propofol -linezolid -local anesthetics like lidocaine, pramoxine, tetracaine -medicines that relax muscles for surgery -other narcotic medicines for pain or cough -phenothiazines like chlorpromazine, mesoridazine, prochlorperazine, thioridazine -procarbazine This list may not describe all possible interactions. Give your health care provider a list of all the medicines, herbs, non-prescription drugs, or dietary supplements you use. Also tell them if you smoke, drink alcohol, or use illegal drugs. Some items may interact with your medicine. What should I watch for while using this medicine? Tell your doctor or health care professional if your pain does not go away, if it gets worse, or if you have new or a different type of pain. You may develop tolerance to the medicine. Tolerance means that you will need a higher dose of the medicine for pain relief. Tolerance is normal and is expected if you take this medicine for a long time. Do not suddenly stop taking your medicine because you may develop a severe reaction. Your body becomes used to the medicine. This does NOT mean you are addicted. Addiction is a behavior related to getting and using a drug for a non-medical reason. If you have pain, you   have a medical reason to take pain medicine. Your doctor will tell  you how much medicine to take. If your doctor wants you to stop the medicine, the dose will be slowly lowered over time to avoid any side effects. There are different types of narcotic medicines (opiates). If you take more than one type at the same time or if you are taking another medicine that also causes drowsiness, you may have more side effects. Give your health care provider a list of all medicines you use. Your doctor will tell you how much medicine to take. Do not take more medicine than directed. Call emergency for help if you have problems breathing or unusual sleepiness. You may get drowsy or dizzy. Do not drive, use machinery, or do anything that needs mental alertness until you know how this medicine affects you. Do not stand or sit up quickly, especially if you are an older patient. This reduces the risk of dizzy or fainting spells. Alcohol can increase or decrease the effects of this medicine. Avoid alcoholic drinks. You may have constipation. Try to have a bowel movement at least every 2 to 3 days. If you do not have a bowel movement for 3 days, call your doctor or health care professional. Your mouth may get dry. Chewing sugarless gum or sucking hard candy, and drinking plenty of water may help. Contact your doctor if the problem does not go away or is severe. What side effects may I notice from receiving this medicine? Side effects that you should report to your doctor or health care professional as soon as possible: -allergic reactions like skin rash, itching or hives, swelling of the face, lips, or tongue -breathing problems -confusion -seizures -signs and symptoms of low blood pressure like dizziness; feeling faint or lightheaded, falls; unusually weak or tired -trouble passing urine or change in the amount of urine Side effects that usually do not require medical attention (report to your doctor or health care professional if they continue or are bothersome): -constipation -dry  mouth -nausea, vomiting -tiredness This list may not describe all possible side effects. Call your doctor for medical advice about side effects. You may report side effects to FDA at 1-800-FDA-1088. Where should I keep my medicine? Keep out of the reach of children. This medicine may cause accidental overdose and death if it taken by other adults, children, or pets. Mix any unused medicine with a substance like cat litter or coffee grounds. Then throw the medicine away in a sealed container like a sealed bag or a coffee can with a lid. Do not use the medicine after the expiration date. Store at room temperature between 15 and 30 degrees C (59 and 86 degrees F). NOTE: This sheet is a summary. It may not cover all possible information. If you have questions about this medicine, talk to your doctor, pharmacist, or health care provider.  2018 Elsevier/Gold Standard (2015-08-31 09:00:04)  

## 2018-01-16 NOTE — Progress Notes (Signed)
Patient Discharge: Disposition: Patient discharged to home. Education: Reviewed medications, prescriptions, follow-up appointments and discharge instructions, verbalized understanding. IV: Discontinued IV before discharge. Telemetry: Discontinued Tele before discharge. Transportation: Patient escorted out of the unit in w/c. Belongings: Patient took all his belongings with him.  

## 2018-01-16 NOTE — Discharge Summary (Signed)
Physician Discharge Summary  Andrew Pacheco MVE:[id number] DOB: 14-Apr-1941 DOA: 01/13/2018  PCP: Andrew Battles, MD  Admit date: 01/13/2018 Discharge date: 01/16/2018  Admitted From: Home Disposition: Home  Recommendations for Outpatient Follow-up:  1. Follow up with PCP in 1-2 weeks 2. Please obtain BMP/CBC in one week 3. Please follow up with GI doctor in 1-2 weeks.  Home Health: No Equipment/Devices: None  Discharge Condition: Stable CODE STATUS: Full Diet recommendation: Heart healthy   Brief/Interim Summary:  ##) Upper GI bleed: Patient received 2 units packed red blood cells on 01/14/2018.  Patient was started on Protonix infusion until EGD on 01/15/2018.  Please see full results of EGD below however most notable for large cratered gastric ulcer that was not bleeding and some gastric stenosis associated with the ulcer.  Patient was transitioned to twice daily PPI.  Patient was strongly discouraged to take any NSAIDs including BC powder.  ##) Paroxysmal afib: Patient was told to restart aspirin 81 mg 1 week after discharge.  ##) Hypertension: Patient was continued on home medications on discharge.  ##) Hyperlipidemia: Patient was continued on home medications.  ##) Arthritis pain: Patient was taking tremendous amounts of BC powder for his arthritis pain for approximately 8 years.  Encourage patient to take tramadol, acetaminophen and follow-up with his PCP for further pain management.   Discharge Diagnoses:  Active Problems:   HTN (hypertension)    Discharge Instructions   Allergies as of 01/16/2018   No Known Allergies     Medication List    TAKE these medications   amLODipine 2.5 MG tablet Commonly known as:  NORVASC Take 1 tablet (2.5 mg total) by mouth daily.   aspirin 81 MG EC tablet Take 1 tablet (81 mg total) by mouth daily.   atorvastatin 40 MG tablet Commonly known as:  LIPITOR Take 1 tablet (40 mg total) by mouth daily at 6 PM.   bisacodyl 10 MG  suppository Commonly known as:  DULCOLAX Place 10 mg rectally as needed for moderate constipation.   COMBIGAN 0.2-0.5 % ophthalmic solution Generic drug:  brimonidine-timolol Place 1 drop into both eyes 2 (two) times daily.   feeding supplement (ENSURE ENLIVE) Liqd Take 237 mLs by mouth 2 (two) times daily between meals. What changed:  when to take this   HYDROcodone-acetaminophen 5-325 MG tablet Commonly known as:  NORCO/VICODIN Take 1 tablet by mouth 2 (two) times daily.   ipratropium 0.06 % nasal spray Commonly known as:  ATROVENT Place 2 sprays into both nostrils daily.   pantoprazole 40 MG tablet Commonly known as:  PROTONIX Take 1 tablet (40 mg total) by mouth 2 (two) times daily before a meal.   senna 8.6 MG Tabs tablet Commonly known as:  SENOKOT Take 3 tablets by mouth every 21 ( twenty-one) days.   traMADol 50 MG tablet Commonly known as:  ULTRAM Take 1 tablet (50 mg total) by mouth every 6 (six) hours as needed for severe pain.   travoprost (benzalkonium) 0.004 % ophthalmic solution Commonly known as:  TRAVATAN Place 1 drop into both eyes at bedtime.   vitamin B-12 1000 MCG tablet Commonly known as:  CYANOCOBALAMIN Take 1 tablet (1,000 mcg total) by mouth daily.   zolpidem 10 MG tablet Commonly known as:  AMBIEN Take 10 mg by mouth at bedtime.      Follow-up Information    Andrew Battles, MD. Call.   Specialty:  Internal Medicine Why:  call his office to arrange lab work.  you need  CBC check in next 7 to 10 days.   Contact information: [address] Andrew Pacheco [address] [phone]        Andrew Shipper, MD Follow up on 02/13/2018.   Specialty:  Gastroenterology Why:  2:15 PM.  this is follow up of mallory weiss tear and stomach ulcer.    Contact information: [address] [place] [address] [phone]          No Known Allergies  Consultations:  [facility]   Procedures/Studies:  No results  found. 01/15/18 EGD: Findings:      LA Grade A (one or more mucosal breaks less than 5 mm, not extending       between tops of 2 mucosal folds) esophagitis with no bleeding was found       at the gastroesophageal junction.      The exam of the esophagus was otherwise normal.      A 14 mm non-bleeding Mallory-Weiss tear with no stigmata of recent       bleeding was found.      A small hiatal hernia was present.      One non-bleeding cratered gastric ulcer with no stigmata of bleeding was       found in the gastric antrum. The lesion was 10 mm in largest dimension.       Biopsies were taken with a cold forceps for Helicobacter pylori testing.      A benign-appearing, intrinsic severe stenosis was found in the gastric       antrum associated with the ulcer. This was traversed.      The exam of the stomach was otherwise normal.      The duodenal bulb and second portion of the duodenum were normal. Impression:               - LA Grade A reflux esophagitis.                           - Mallory-Weiss tear.                           - Small hiatal hernia.                           - Non-bleeding gastric ulcer with no stigmata of                            bleeding. Biopsied.                           - Gastric stenosis associated with the ulcer was                            found in the gastric antrum.                           - Normal duodenal bulb and second portion of the                            duodenum.    Subjective:   Discharge Exam: Vitals:   01/16/18 0540 01/16/18 1004  BP: 137/61 117/60  Pulse: (!) 58 (!) 57  Resp: 18 17  Temp: 98.7 F (37.1 C) 97.7 F (36.5 C)  SpO2: 98% 97%   Vitals:   01/15/18 1713 01/15/18 2120 01/16/18 0540 01/16/18 1004  BP: 118/60 125/62 137/61 117/60  Pulse: 67 62 (!) 58 (!) 57  Resp: 16 16 18 17   Temp: 98.2 F (36.8 C) 98.4 F (36.9 C) 98.7 F (37.1 C) 97.7 F (36.5 C)  TempSrc: Oral Oral Oral Oral  SpO2: 98% 100% 98% 97%  Weight:   77.3 kg (170 lb 6.7 oz)    Height:       General exam: Appears calm and comfortable  Respiratory system: Clear to auscultation. Respiratory effort normal. Cardiovascular system: S1 & S2 heard, No pedal edema. Gastrointestinal system: Abdomen is nondistended, soft and nontender. +BS, mildly tender to palpation in epigastric area Central nervous system: Alert and oriented. No focal neurological deficits. Extremities: No lower extremity edema Skin: Senile purpura noted Psychiatry: Judgement and insight appear normal.      The results of significant diagnostics from this hospitalization (including imaging, microbiology, ancillary and laboratory) are listed below for reference.     Microbiology: No results found for this or any previous visit (from the past 240 hour(s)).   Labs: BNP (last 3 results) No results for input(s): BNP in the last 8760 hours. Basic Metabolic Panel: Recent Labs  Lab 01/13/18 1748 01/14/18 0447 01/15/18 0421  NA 139 141 140  K 4.2 4.1 4.2  CL 108 112* 112*  CO2 20* 20* 21*  GLUCOSE 95 91 81  BUN 60* 41* 22*  CREATININE 1.08 1.14 0.95  CALCIUM 8.9 8.6* 8.4*   Liver Function Tests: Recent Labs  Lab 01/13/18 1748 01/14/18 0823  AST 16 13*  ALT 8* 9*  ALKPHOS 55 49  BILITOT 0.5 0.5  PROT 6.1* 5.2*  ALBUMIN 3.6 3.0*   Recent Labs  Lab 01/13/18 1748  LIPASE 31   No results for input(s): AMMONIA in the last 168 hours. CBC: Recent Labs  Lab 01/13/18 1748 01/14/18 0447 01/14/18 0823  01/15/18 0421  01/15/18 1215 01/15/18 1718 01/15/18 2013 01/15/18 2349 01/16/18 0439  WBC 11.3* 6.7  --   --  4.1  --   --   --   --   --  3.4*  HGB 8.6* 7.6* 7.5*   < > 8.9*  8.9*   < > 8.7* 9.2* 9.2* 8.0* 8.3*  HCT 27.6* 23.2* 23.8*   < > 27.1*  27.5*   < > 27.1* 27.8* 28.6* 24.6* 25.4*  MCV 90.8 90.6  --   --  89.4  --   --   --   --   --  91.0  PLT 167 141* 136*  --  120*  --   --   --   --   --  107*   < > = values in this interval not displayed.    Cardiac Enzymes: No results for input(s): CKTOTAL, CKMB, CKMBINDEX, TROPONINI in the last 168 hours. BNP: Invalid input(s): POCBNP CBG: No results for input(s): GLUCAP in the last 168 hours. D-Dimer Recent Labs    01/14/18 0823  DDIMER 0.33   Hgb A1c No results for input(s): HGBA1C in the last 72 hours. Lipid Profile No results for input(s): CHOL, HDL, LDLCALC, TRIG, CHOLHDL, LDLDIRECT in the last 72 hours. Thyroid function studies No results for input(s): TSH, T4TOTAL, T3FREE, THYROIDAB in the last 72 hours.  Invalid input(s): FREET3 Anemia work up No results for input(s): VITAMINB12, FOLATE, FERRITIN,  TIBC, IRON, RETICCTPCT in the last 72 hours. Urinalysis    Component Value Date/Time   COLORURINE STRAW (A) 01/13/2018 1909   APPEARANCEUR CLEAR 01/13/2018 1909   LABSPEC 1.018 01/13/2018 1909   PHURINE 5.0 01/13/2018 1909   GLUCOSEU NEGATIVE 01/13/2018 1909   HGBUR NEGATIVE 01/13/2018 1909   BILIRUBINUR NEGATIVE 01/13/2018 1909   KETONESUR NEGATIVE 01/13/2018 1909   PROTEINUR NEGATIVE 01/13/2018 1909   NITRITE NEGATIVE 01/13/2018 1909   LEUKOCYTESUR NEGATIVE 01/13/2018 1909   Sepsis Labs Invalid input(s): PROCALCITONIN,  WBC,  LACTICIDVEN Microbiology No results found for this or any previous visit (from the past 240 hour(s)).   Time coordinating discharge: Over 30 minutes  SIGNED:   Cristy Folks, MD  [facility] 01/16/2018, 12:03 PM  If 7PM-7AM, please contact night-coverage [email or web address] Password [email or web address]

## 2018-01-16 NOTE — Progress Notes (Signed)
Daily Rounding Note  01/16/2018, 8:42 AM  LOS: 3 days   SUBJECTIVE:   Chief complaint:  Feels better, no further vomiting or nausea.  Weakness, fatigue is better.  Walking without problem     OBJECTIVE:         Vital signs in last 24 hours:    Temp:  [98 F (36.7 C)-98.7 F (37.1 C)] 98.7 F (37.1 C) (01/28 0540) Pulse Rate:  [58-81] 58 (01/28 0540) Resp:  [11-22] 18 (01/28 0540) BP: (97-161)/(39-70) 137/61 (01/28 0540) SpO2:  [94 %-100 %] 98 % (01/28 0540) Weight:  [77.3 kg (170 lb 6.7 oz)] 77.3 kg (170 lb 6.7 oz) (01/27 2120) Last BM Date: 01/13/18 Filed Weights   01/14/18 0017 01/14/18 2023 01/15/18 2120  Weight: 75.2 kg (165 lb 12.6 oz) 75.3 kg (166 lb 0.1 oz) 77.3 kg (170 lb 6.7 oz)   General: pleasant, looks well.     Heart: RRR Chest: clear bil.   Abdomen: soft, NT.  Active BS  Extremities: no CCE Neuro/Psych:  Talkative, alert, pleasant.  No confusion or gross deficits.    Intake/Output from previous day: 01/27 0701 - 01/28 0700 In: 5442.1 [P.O.:240; I.V.:5202.1] Out: 1800 [Urine:1800]  Intake/Output this shift: No intake/output data recorded.  Lab Results: Recent Labs    01/14/18 0447 01/14/18 0823  01/15/18 0421  01/15/18 2013 01/15/18 2349 01/16/18 0439  WBC 6.7  --   --  4.1  --   --   --  3.4*  HGB 7.6* 7.5*   < > 8.9*  8.9*   < > 9.2* 8.0* 8.3*  HCT 23.2* 23.8*   < > 27.1*  27.5*   < > 28.6* 24.6* 25.4*  PLT 141* 136*  --  120*  --   --   --  PENDING   < > = values in this interval not displayed.   BMET Recent Labs    01/13/18 1748 01/14/18 0447 01/15/18 0421  NA 139 141 140  K 4.2 4.1 4.2  CL 108 112* 112*  CO2 20* 20* 21*  GLUCOSE 95 91 81  BUN 60* 41* 22*  CREATININE 1.08 1.14 0.95  CALCIUM 8.9 8.6* 8.4*   LFT Recent Labs    01/13/18 1748 01/14/18 0823  PROT 6.1* 5.2*  ALBUMIN 3.6 3.0*  AST 16 13*  ALT 8* 9*  ALKPHOS 55 49  BILITOT 0.5 0.5  BILIDIR  --   <0.1*  IBILI  --  NOT CALCULATED   PT/INR Recent Labs    01/14/18 0823 01/14/18 1256  LABPROT 14.6 15.0  INR 1.14 1.18   Hepatitis Panel No results for input(s): HEPBSAG, HCVAB, HEPAIGM, HEPBIGM in the last 72 hours.  Studies/Results: No results found.  Scheduled Meds: . amLODipine  2.5 mg Oral Daily  . aspirin EC  81 mg Oral Daily  . atorvastatin  40 mg Oral q1800  . brimonidine  1 drop Both Eyes BID   And  . timolol  1 drop Both Eyes BID  . ipratropium  2 spray Each Nare Daily  . pantoprazole  40 mg Oral BID AC  . senna  3 tablet Oral Q21 days  . Travoprost (BAK Free)  1 drop Both Eyes QHS  . vitamin B-12  1,000 mcg Oral Daily   Continuous Infusions: . sodium chloride 125 mL/hr at 01/15/18 1721   PRN Meds:.acetaminophen **OR** acetaminophen, HYDROcodone-acetaminophen, ondansetron **OR** ondansetron (ZOFRAN) IV, zolpidem   ASSESMENT:   *  Hematemesis. melena.  Long hx of antral ulcer dating to at least 2010.  Chronic ASA powders and 81 mg ASA added 08/2017.  Apparent hx of PAF.  Inconsistent use of PPI.   1/27 EGD: mild esophagitis.  MW tear.  Antral ulcer without bleeding stigmata but assoc with severe stenosis.   On BID oral Protonix.   Awaiting biopsy of ulcer.   Of note, pt had no prodrome of early satiety, N/V to suggest sxs from the ulcer associated stenosis.    *  Acute blood loss anemia.  Hx IDA.  2015 colonoscopy with diverticulosis, incomplete capsule endoscopy but ? Duodenal ulcers.  On B12 supplements for low levels in 08/2017.   S/p 2 U PRBC.    *  Chronic mild thrombocytopenia.    *  Chronic back pain, thus the use of BC/Goodies, opiates.    *  Apparent hx PAF.  Looked at all previous EKGs and see SR only.    PLAN   *  BID oral PPI, Omeprazole 40 mg BID or equivalent.  Will discharge home today.     Stop BC. Goodies.    Await path results.   DCd order for ASA 81 mg to start today.  Plan restart in 10 days at [facility] with Dr Henrene Pastor on  2/25 Needs CBC drawn in next 7 to 10 days, can this be done by PMD [name]?    Azucena Freed  01/16/2018, 8:42 AM Pager: [phone]  ________________________________________________________________________  Velora Heckler GI MD note:  I personally examined the patient, reviewed the data and agree with the assessment and plan described above. He feels overall well, ready for d/c.  Understands he should avoid NSAIDs as best as possible going forward.  Follow up in our office already set.   Owens Loffler, MD [facility] Pager [phone]

## 2018-01-19 ENCOUNTER — Encounter: Payer: Self-pay | Admitting: Gastroenterology

## 2018-02-13 ENCOUNTER — Ambulatory Visit: Payer: Medicare Other | Admitting: Internal Medicine

## 2018-02-13 ENCOUNTER — Encounter: Payer: Self-pay | Admitting: Internal Medicine

## 2018-02-13 VITALS — BP 120/70 | Ht 71.0 in | Wt 159.0 lb

## 2018-02-13 DIAGNOSIS — K259 Gastric ulcer, unspecified as acute or chronic, without hemorrhage or perforation: Secondary | ICD-10-CM

## 2018-02-13 DIAGNOSIS — D62 Acute posthemorrhagic anemia: Secondary | ICD-10-CM | POA: Diagnosis not present

## 2018-02-13 MED ORDER — FERROUS SULFATE 325 (65 FE) MG PO TABS: 325.0000 mg | ORAL_TABLET | Freq: Every day | ORAL | 11 refills | Status: DC

## 2018-02-13 NOTE — Progress Notes (Signed)
HISTORY OF PRESENT ILLNESS:  Andrew Pacheco is a 77 y.o. male with hypertension and chronic back pain as well as a history of Helicobacter pylori associated ulcer disease who presents today for posthospitalization follow-up regarding hematemesis and symptomatic anemia. He was last evaluated 05/29/2014 for iron deficiency anemia. Hemoglobin at that time 9.6. See that dictation for details. He subsequently underwent colonoscopy and upper endoscopy. Colonoscopy including intubation of the terminal ileum was normal except for left-sided diverticulosis. Similar to his exam in 2006. Upper endoscopy was normal. Subsequent capsule endoscopy was performed and revealed duodenal erosions. He was treated with PPI and iron. Has not been seen since. He has been off PPI for several years at least. Prior to his recent hospitalization he had been taking BC powders for his back pain. Hemoglobin on admission was 8.6. Upper endoscopy was performed with Dr. Fuller Plan and revealed nonbleeding Mallory-Weiss tear as well as a 10 mm gastric ulcer. Testing for Helicobacter pylori was negative. He was placed on pantoprazole 40 mg twice daily and this follow-up arranged. Since his discharge the patient reports that he has been feeling well. Improve strength. No upper GI symptoms. No longer taking BC powders.  REVIEW OF SYSTEMS:  All non-GI ROS negative except for arthritis, back pain, cough  Past Medical History:  Diagnosis Date  . Anemia   . Arthritis    "both hips, back" (09/09/2017)  . Chronic back pain    "middle and lower back" (09/09/2017)  . Colon polyps   . Daily headache   . Diverticulosis   . GERD (gastroesophageal reflux disease)   . Glaucoma, both eyes   . H pylori ulcer   . Hard of hearing   . HTN (hypertension)   . 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 Lumbar disc disease   . Migraine    "q 3-4 months" (09/09/2017)  . Seasonal allergies     Past Surgical History:  Procedure Laterality Date  . APPENDECTOMY    . CYST  EXCISION Right ~ 1971   knee  . ESOPHAGOGASTRODUODENOSCOPY N/A 01/15/2018   Procedure: ESOPHAGOGASTRODUODENOSCOPY (EGD);  Surgeon: Ladene Artist, MD;  Location: [facility];  Service: Endoscopy;  Laterality: N/A;    Social History LINFORD QUINTELA  reports that  has never smoked. he has never used smokeless tobacco. He reports that he drinks alcohol. He reports that he does not use drugs.  family history includes Arthritis in his mother; Cancer in his mother; Diverticulosis in his father; Emphysema in his father; Heart disease in his father; Liver cancer in his mother.  No Known Allergies     PHYSICAL EXAMINATION: Vital signs: BP 120/70   Ht 5\' 11"  (1.803 m)   Wt 159 lb (72.1 kg)   BMI 22.18 kg/m   Constitutional: generally well-appearing, no acute distress Psychiatric: alert and oriented x3, cooperative Eyes: extraocular movements intact, anicteric, conjunctiva pink Mouth: oral pharynx moist, no lesions Neck: supple no lymphadenopathy Cardiovascular: heart regular rate and rhythm, no murmur Lungs: clear to auscultation bilaterally Abdomen: soft, nontender, nondistended, no obvious ascites, no peritoneal signs, normal bowel sounds, no organomegaly Rectal: Omitted Extremities: no clubbing, cyanosis, or lower extremity edema bilaterally Skin: no lesions on visible extremities Neuro: No focal deficits. Cranial nerves intact  ASSESSMENT:  #1. Acute gastric ulcer secondary to NSAIDs (BC powders) #2. Mallory-Weiss tear to explain recent bout of hematemesis #3. Daily blood loss anemia #4. History of iron deficiency anemia with GI workup 2015 as outlined above  PLAN:  #  1. Iron sulfate 325 mg daily indefinitely. Prescribed for him today #2. PCP can monitor your blood counts periodically to rule out recurrent anemia #3. Continue pantoprazole 40 mg twice daily for now. Prescribed for him today #4. Set up follow-up upper endoscopy in about 6 weeks to document ulcer healing. At that  time, could possibly decrease his pantoprazole to once daily.The nature of the procedure, as well as the risks, benefits, and alternatives were carefully and thoroughly reviewed with the patient. Ample time for discussion and questions allowed. The patient understood, was satisfied, and agreed to proceed. #5. Ongoing general medical care with Dr. Ranee Gosselin I then spent face-to-face with the patient. Greater than 50% a time use for counseling regarding his recent problems with hematemesis, gastric ulcer, and recurrent anemia

## 2018-02-13 NOTE — Patient Instructions (Signed)
We have sent the following medications to your pharmacy for you to pick up at your convenience:  Iron sulfate  You have been scheduled for an endoscopy. Please follow written instructions given to you at your visit today. If you use inhalers (even only as needed), please bring them with you on the day of your procedure. Your physician has requested that you go to www.[email or web address].com and enter the access code given to you at your visit today. This web site gives a general overview about your procedure. However, you should still follow specific instructions given to you by our office regarding your preparation for the procedure.

## 2018-03-20 ENCOUNTER — Encounter: Payer: Medicare Other | Admitting: Internal Medicine

## 2018-03-21 ENCOUNTER — Encounter: Payer: Self-pay | Admitting: Internal Medicine

## 2018-04-03 ENCOUNTER — Encounter: Payer: Self-pay | Admitting: Internal Medicine

## 2018-04-03 ENCOUNTER — Ambulatory Visit (AMBULATORY_SURGERY_CENTER): Payer: Medicare Other | Admitting: Internal Medicine

## 2018-04-03 ENCOUNTER — Other Ambulatory Visit: Payer: Self-pay

## 2018-04-03 VITALS — BP 157/57 | HR 81 | Temp 98.4°F | Resp 11 | Ht 71.0 in | Wt 159.0 lb

## 2018-04-03 DIAGNOSIS — K259 Gastric ulcer, unspecified as acute or chronic, without hemorrhage or perforation: Secondary | ICD-10-CM

## 2018-04-03 MED ORDER — SODIUM CHLORIDE 0.9 % IV SOLN: 500.0000 mL | Freq: Once | INTRAVENOUS | Status: DC

## 2018-04-03 MED ORDER — PANTOPRAZOLE SODIUM 40 MG PO TBEC: 40.0000 mg | DELAYED_RELEASE_TABLET | Freq: Every day | ORAL | 3 refills | Status: DC

## 2018-04-03 NOTE — Progress Notes (Signed)
Report to PACU, RN, vss, BBS= Clear.  

## 2018-04-03 NOTE — Op Note (Signed)
[place] Patient Name: Andrew Pacheco Procedure Date: 04/03/2018 2:55 PM [name]: [id number] Endoscopist: Docia Chuck. Henrene Pastor , MD Age: 77 Referring MD:  Date of Birth: 05/13/1941 Gender: Male Account #: [id number] Procedure:                Upper GI endoscopy Indications:              Follow-up of acute gastric ulcer Medicines:                Monitored Anesthesia Care Procedure:                Pre-Anesthesia Assessment:                           - Prior to the procedure, a History and Physical                            was performed, and patient medications and                            allergies were reviewed. The patient's tolerance of                            previous anesthesia was also reviewed. The risks                            and benefits of the procedure and the sedation                            options and risks were discussed with the patient.                            All questions were answered, and informed consent                            was obtained. Prior Anticoagulants: The patient has                            taken no previous anticoagulant or antiplatelet                            agents. ASA Grade Assessment: II - A patient with                            mild systemic disease. After reviewing the risks                            and benefits, the patient was deemed in                            satisfactory condition to undergo the procedure.                           After obtaining informed consent, the endoscope was  passed under direct vision. Throughout the                            procedure, the patient's blood pressure, pulse, and                            oxygen saturations were monitored continuously. The                            Endoscope was introduced through the mouth, and                            advanced to the second part of duodenum. The upper                            GI endoscopy was accomplished  without difficulty.                            The patient tolerated the procedure well. Scope In: Scope Out: Findings:                 The esophagus was normal.                           The stomach was normal. The previous ulcer has                            healed.                           The examined duodenum was normal.                           The cardia and gastric fundus were normal on                            retroflexion. Complications:            No immediate complications. Estimated Blood Loss:     Estimated blood loss: none. Impression:               - Normal esophagus.                           - Normal stomach. The previous ulcer has healed.                           - Normal examined duodenum.                           - No specimens collected. Recommendation:           - Patient has a contact number available for                            emergencies. The signs and symptoms of potential  delayed complications were discussed with the                            patient. Return to normal activities tomorrow.                            Written discharge instructions were provided to the                            patient.                           - Resume previous diet.                           - Continue pantoprazole 40 mg ONCE daily; #40; 11                            refills. This will reduce the risk of recurrent                            ulcer formation. Docia Chuck. Henrene Pastor, MD 04/03/2018 3:09:31 PM This report has been signed electronically.

## 2018-04-03 NOTE — Patient Instructions (Signed)
Discharge instructions given. Normal exam. Resume previous medications. YOU HAD AN ENDOSCOPIC PROCEDURE TODAY AT THE [facility]:   Refer to the procedure report that was given to you for any specific questions about what was found during the examination.  If the procedure report does not answer your questions, please call your gastroenterologist to clarify.  If you requested that your care partner not be given the details of your procedure findings, then the procedure report has been included in a sealed envelope for you to review at your convenience later.  YOU SHOULD EXPECT: Some feelings of bloating in the abdomen. Passage of more gas than usual.  Walking can help get rid of the air that was put into your GI tract during the procedure and reduce the bloating. If you had a lower endoscopy (such as a colonoscopy or flexible sigmoidoscopy) you may notice spotting of blood in your stool or on the toilet paper. If you underwent a bowel prep for your procedure, you may not have a normal bowel movement for a few days.  Please Note:  You might notice some irritation and congestion in your nose or some drainage.  This is from the oxygen used during your procedure.  There is no need for concern and it should clear up in a day or so.  SYMPTOMS TO REPORT IMMEDIATELY:   Following upper endoscopy (EGD)  Vomiting of blood or coffee ground material  New chest pain or pain under the shoulder blades  Painful or persistently difficult swallowing  New shortness of breath  Fever of 100F or higher  Black, tarry-looking stools  For urgent or emergent issues, a gastroenterologist can be reached at any hour by calling [phone].   DIET:  We do recommend a small meal at first, but then you may proceed to your regular diet.  Drink plenty of fluids but you should avoid alcoholic beverages for 24 hours.  ACTIVITY:  You should plan to take it easy for the rest of today and you should NOT DRIVE or  use heavy machinery until tomorrow (because of the sedation medicines used during the test).    FOLLOW UP: Our staff will call the number listed on your records the next business day following your procedure to check on you and address any questions or concerns that you may have regarding the information given to you following your procedure. If we do not reach you, we will leave a message.  However, if you are feeling well and you are not experiencing any problems, there is no need to return our call.  We will assume that you have returned to your regular daily activities without incident.  If any biopsies were taken you will be contacted by phone or by letter within the next 1-3 weeks.  Please call us at [phone] if you have not heard about the biopsies in 3 weeks.    SIGNATURES/CONFIDENTIALITY: You and/or your care partner have signed paperwork which will be entered into your electronic medical record.  These signatures attest to the fact that that the information above on your After Visit Summary has been reviewed and is understood.  Full responsibility of the confidentiality of this discharge information lies with you and/or your care-partner. 

## 2018-08-17 ENCOUNTER — Telehealth (INDEPENDENT_AMBULATORY_CARE_PROVIDER_SITE_OTHER): Payer: Self-pay | Admitting: Radiology

## 2018-08-17 NOTE — Telephone Encounter (Signed)
Patient is wanting to know if he can have the OV and an injection done same day?  He has OV scheduled for 08/29/18 at 845am, please call him and advise.

## 2018-08-17 NOTE — Telephone Encounter (Signed)
Called patient and advised that we cannot do an injection the same day and that we have to start with an OV since he has not been seen in over a year. I also rescheduled the patient for 9/17 since he needs to be a 30 minutes visit.

## 2018-08-29 ENCOUNTER — Encounter (INDEPENDENT_AMBULATORY_CARE_PROVIDER_SITE_OTHER): Payer: Self-pay | Admitting: Physical Medicine and Rehabilitation

## 2018-08-29 ENCOUNTER — Ambulatory Visit (INDEPENDENT_AMBULATORY_CARE_PROVIDER_SITE_OTHER): Payer: Medicare Other | Admitting: Physical Medicine and Rehabilitation

## 2018-08-29 ENCOUNTER — Telehealth (INDEPENDENT_AMBULATORY_CARE_PROVIDER_SITE_OTHER): Payer: Self-pay | Admitting: *Deleted

## 2018-08-29 VITALS — BP 127/75 | HR 68 | Ht 71.0 in | Wt 160.0 lb

## 2018-08-29 DIAGNOSIS — M545 Low back pain: Secondary | ICD-10-CM

## 2018-08-29 DIAGNOSIS — M25551 Pain in right hip: Secondary | ICD-10-CM

## 2018-08-29 DIAGNOSIS — M25552 Pain in left hip: Secondary | ICD-10-CM | POA: Diagnosis not present

## 2018-08-29 DIAGNOSIS — G8929 Other chronic pain: Secondary | ICD-10-CM

## 2018-08-29 DIAGNOSIS — M47816 Spondylosis without myelopathy or radiculopathy, lumbar region: Secondary | ICD-10-CM | POA: Diagnosis not present

## 2018-08-29 NOTE — Telephone Encounter (Signed)
Per [facility] website this [facility] Advantage members plan does not currently require a prior authorization for [phone].

## 2018-08-29 NOTE — Progress Notes (Signed)
.  Numeric Pain Rating Scale and Functional Assessment Average Pain 8 Pain Right Now 5 My pain is constant and dull Pain is worse with: sitting and standing Pain improves with: therapy/exercise   In the last MONTH (on 0-10 scale) has pain interfered with the following?  1. General activity like being  able to carry out your everyday physical activities such as walking, climbing stairs, carrying groceries, or moving a chair?  Rating(5)  2. Relation with others like being able to carry out your usual social activities and roles such as  activities at home, at work and in your community. Rating(3)  3. Enjoyment of life such that you have  been bothered by emotional problems such as feeling anxious, depressed or irritable?  Rating(0)

## 2018-08-30 ENCOUNTER — Encounter (INDEPENDENT_AMBULATORY_CARE_PROVIDER_SITE_OTHER): Payer: Self-pay | Admitting: Physical Medicine and Rehabilitation

## 2018-08-30 NOTE — Progress Notes (Signed)
PHUONG MOFFATT - 77 y.o. male [name] [id number]  Date of birth: Feb 06, 1941  Office Visit Note: Visit Date: 08/29/2018 PCP: [name], MD Referred by: [name], MD  Subjective: Chief Complaint  Patient presents with  . Lower Back - Pain  . Left Hip - Pain  . Right Hip - Pain   HPI: Mr. Vanbeek is a 77 year old gentleman I last saw in June of last year and completed bilateral diagnostic and therapeutic facet blocks at L4-5 L5-S1 for chronic axial low back pain.  He reports those injections did help for quite a while.  He reports having some medical issues after that.  He had an upper GI bleed this past February.  He is also been caregiver for his wife is had some medical issues.  He reports ongoing back pain that started around Christmas time without any inciting injury.  He has no radicular pain.  His pain is worse with standing and going from sit to stand.  He reports not really been able to stand for any length of time before having to find a place to sit.  He actually reports walking makes it feel better.  He does not feel like he has to sit down after walking.  No feelings in the legs or paresthesias.  No focal weakness.  No bowel or bladder changes no other illnesses recently.  No unexplained weight loss.  He has had no recent physical therapy but has had physical therapy in the past.  He has been seen by Dr. Kristeen Miss at [facility] with the last time being in 2015.  He had some issues with thoracic pain and thoracic disc.  Lumbar spine x-rays were completed about a year ago showing mainly facet arthropathy of the lower spine with degenerative changes.  No significant listhesis etc.  CT myelogram was reviewed again below but this was done in 2009.   Review of Systems  Constitutional: Negative for chills, fever, malaise/fatigue and weight loss.  HENT: Negative for hearing loss and sinus pain.   Eyes: Negative for blurred vision, double vision and  photophobia.  Respiratory: Negative for cough and shortness of breath.   Cardiovascular: Negative for chest pain, palpitations and leg swelling.  Gastrointestinal: Negative for abdominal pain, nausea and vomiting.  Genitourinary: Negative for flank pain.  Musculoskeletal: Positive for back pain and joint pain. Negative for myalgias.  Skin: Negative for itching and rash.  Neurological: Negative for tremors, focal weakness and weakness.  Endo/Heme/Allergies: Negative.   Psychiatric/Behavioral: Negative for depression.  All other systems reviewed and are negative.  Otherwise per HPI.  Assessment & Plan: Visit Diagnoses:  1. Spondylosis without myelopathy or radiculopathy, lumbar region   2. Chronic bilateral low back pain without sciatica   3. Pain in left hip   4. Pain in right hip     Plan: Findings:  Chronic worsening severe at times axial low back pain equally left and right.  Worse with standing and worse with facet joint loading on exam.  Imaging consistent with facet mediated pain.  Diagnostic facet blocks gave him almost 90% relief for actually quite a while.  Prior injection some years ago actually gave him quite a bit of relief for a longer period of time than you would expect.  He still tries to stay pretty active and walking.  He has had some home exercises today and has had physical therapy in the past but not recently.  This is been a chronic ongoing  situation and now chronic worsening for several months.  He really reports onset of symptoms in December which is progressively getting worse.  He does affect his daily activities.  I think the best approach is to repeat the diagnostic and hopefully therapeutic facet blocks bilaterally at L4-5 and L5-S1 with fluoroscopic guidance.  Depending on relief would regroup with a short course of physical therapy and possibly radiofrequency ablation.  He will continue to take current medications.  He does take a small amount of opioid.    Meds  & Orders: No orders of the defined types were placed in this encounter.  No orders of the defined types were placed in this encounter.   Follow-up: Return for Bilateral L4-5 and L5-S1 facet block.   Procedures: No procedures performed  No notes on file   Clinical History: Post myelogram CT of the lumbar spine: 2019   Comparison:  MR 06/08/2006.   Findings: Mild atherosclerotic type changes of the aorta branch vessels.  Small hiatal hernia.   L1-2:  Conus is at the level.  No abnormal vessels.  Central protrusion with mild indentation upon the thecal sac.  There may be left posterior lateral cephalad  extension of disc material as there is broad-based impression on the left ventral aspect of thecal sac at the mid to lower L1 level.  Epidural process such as hematoma or tumor felt to be less likely considerations.   L2-3:  No significant spinal stenosis or foraminal narrowing.   L3-4:  Hemangioma L3 vertebra unchanged.  Bulge with superimposed shallow broad-based right lateral protrusion with mild indentation on the right lateral aspect of the thecal sac.   L4-5:  Mild bulge.  Mild bilateral facet joint degenerative changes.  Very mild spinal stenosis.   L5-S1:  Mild to moderate bilateral facet joint degenerative changes.  Minimal anterior slip of L5 upon S1.  Bulge with mild spur.  Mild to slightly moderate bilateral foraminal narrowing.   Impression:  Degenerative changes throughout the lumbar spine as noted above.  Most significant change since the prior MR scan is that noted at the L1-2 level as described above.  Provider: Hubbard Robinson   He reports that he has never smoked. He has never used smokeless tobacco.  Recent Labs    09/11/17 0508  HGBA1C 5.6    Objective:  VS:  HT:5\' 11"  (180.3 cm)   WT:160 lb (72.6 kg)  BMI:22.33    BP:127/75  HR:68bpm  TEMP: ( )  RESP:99 % Physical Exam  Constitutional: He is oriented to person, place, and time. He appears  well-developed and well-nourished. No distress.  HENT:  Head: Normocephalic and atraumatic.  Eyes: Pupils are equal, round, and reactive to light. Conjunctivae are normal.  Neck: Normal range of motion. Neck supple.  Cardiovascular: Regular rhythm and intact distal pulses.  Pulmonary/Chest: Effort normal. No respiratory distress.  Musculoskeletal:  Patient ambulates without aid.  He is slow to go from sit to stand with pain on full extension.  He has pain with facet joint loading and extension rotation.  He has no paraspinal tenderness or focal trigger points.  No pain over the greater trochanters.  No pain with hip rotation.  He has good distal strength without clonus.  Neurological: He is alert and oriented to person, place, and time.  Skin: Skin is warm and dry. No rash noted. No erythema.  Psychiatric: He has a normal mood and affect.  Nursing note and vitals reviewed.   Ortho Exam Imaging: No  results found.  Past Medical/Family/Surgical/Social History: Medications & Allergies reviewed per EMR, new medications updated. Patient Active Problem List   Diagnosis Date Noted  . Vasodepressor syncope 09/11/2017  . Vasovagal near syncope 09/11/2017  . Low vitamin B12 level 09/10/2017  . HTN (hypertension) 09/10/2017  . Malnutrition of moderate degree 09/10/2017  . Gastroesophageal reflux disease   . AKI (acute kidney injury) ([place])   . Glaucoma   . Near syncope 09/08/2017  . 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  . ANEMIA-UNSPECIFIED 09/26/2009  . CONSTIPATION 09/26/2009  . NAUSEA 09/26/2009  . ABDOMINAL PAIN -GENERALIZED 09/26/2009   Past Medical History:  Diagnosis Date  . Anemia   . Arthritis    "both hips, back" (09/09/2017)  . Chronic back pain    "middle and lower back" (09/09/2017)  . Colon polyps   . Daily headache   . Diverticulosis   . GERD (gastroesophageal reflux disease)   . Glaucoma, both eyes   . H pylori ulcer   . Hard of hearing   . HTN (hypertension)   . 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 Lumbar disc disease   . Migraine    "q 3-4 months" (09/09/2017)  . Seasonal allergies    Family History  Problem Relation Age of Onset  . Heart disease Father   . Emphysema Father   . Diverticulosis Father   . Cancer Mother        gallbladder-mets  . Arthritis Mother   . Liver cancer Mother    Past Surgical History:  Procedure Laterality Date  . APPENDECTOMY    . CYST EXCISION Right ~ 1971   knee  . ESOPHAGOGASTRODUODENOSCOPY N/A 01/15/2018   Procedure: ESOPHAGOGASTRODUODENOSCOPY (EGD);  Surgeon: Ladene Artist, MD;  Location: [facility];  Service: Endoscopy;  Laterality: N/A;   Social History   Occupational History  . Occupation: security guard - [employer]  Tobacco Use  . Smoking status: Never Smoker  . Smokeless tobacco: Never Used  Substance and Sexual Activity  . Alcohol use: Yes    Comment: 09/09/2017 "couple glasses of wine q 6 months"  . Drug use: No  . Sexual activity: Not on file

## 2018-09-05 ENCOUNTER — Ambulatory Visit (INDEPENDENT_AMBULATORY_CARE_PROVIDER_SITE_OTHER): Payer: Medicare Other | Admitting: Physical Medicine and Rehabilitation

## 2018-09-11 ENCOUNTER — Ambulatory Visit (INDEPENDENT_AMBULATORY_CARE_PROVIDER_SITE_OTHER): Payer: Self-pay

## 2018-09-11 ENCOUNTER — Encounter (INDEPENDENT_AMBULATORY_CARE_PROVIDER_SITE_OTHER): Payer: Self-pay | Admitting: Physical Medicine and Rehabilitation

## 2018-09-11 ENCOUNTER — Ambulatory Visit (INDEPENDENT_AMBULATORY_CARE_PROVIDER_SITE_OTHER): Payer: Medicare Other | Admitting: Physical Medicine and Rehabilitation

## 2018-09-11 VITALS — BP 148/75 | HR 61 | Temp 98.0°F

## 2018-09-11 DIAGNOSIS — M47816 Spondylosis without myelopathy or radiculopathy, lumbar region: Secondary | ICD-10-CM | POA: Diagnosis not present

## 2018-09-11 MED ORDER — METHYLPREDNISOLONE ACETATE 80 MG/ML IJ SUSP
80.0000 mg | Freq: Once | INTRAMUSCULAR | Status: AC
  Administered 2018-09-11: 80 mg

## 2018-09-11 NOTE — Progress Notes (Signed)
 .  Numeric Pain Rating Scale and Functional Assessment Average Pain 4   In the last MONTH (on 0-10 scale) has pain interfered with the following?  1. General activity like being  able to carry out your everyday physical activities such as walking, climbing stairs, carrying groceries, or moving a chair?  Rating(5)   +Driver, -BT, -Dye Allergies.

## 2018-09-11 NOTE — Patient Instructions (Signed)

## 2018-09-19 NOTE — Procedures (Signed)
Lumbar Diagnostic Facet Joint Nerve Block with Fluoroscopic Guidance   Patient: Andrew Pacheco      Date of Birth: 1941-08-10 [name]: [id number] PCP: [name], MD      Visit Date: 09/11/2018   Universal Protocol:    Date/Time: 10/01/195:17 AM  Consent Given By: the patient  Position: PRONE  Additional Comments: Vital signs were monitored before and after the procedure. Patient was prepped and draped in the usual sterile fashion. The correct patient, procedure, and site was verified.   Injection Procedure Details:  Procedure Site One Meds Administered:  Meds ordered this encounter  Medications  . methylPREDNISolone acetate (DEPO-MEDROL) injection 80 mg     Laterality: Bilateral  Location/Site:  L4-L5 L5-S1  Needle size: 22 ga.  Needle type:spinal  Needle Placement: Oblique pedical  Findings:   -Comments: There was excellent flow of contrast along the articular pillars without intravascular flow.  Procedure Details: The fluoroscope beam is vertically oriented in AP and then obliqued 15 to 20 degrees to the ipsilateral side of the desired nerve to achieve the "Scotty dog" appearance.  The skin over the target area of the junction of the superior articulating process and the transverse process (sacral ala if blocking the L5 dorsal rami) was locally anesthetized with a 1 ml volume of 1% Lidocaine without Epinephrine.  The spinal needle was inserted and advanced in a trajectory view down to the target.   After contact with periosteum and negative aspirate for blood and CSF, correct placement without intravascular or epidural spread was confirmed by injecting 0.5 ml. of Isovue-250.  A spot radiograph was obtained of this image.    Next, a 0.5 ml. volume of the injectate described above was injected. The needle was then redirected to the other facet joint nerves mentioned above if needed.  Prior to the procedure, the patient was given a Pain Diary which was completed for  baseline measurements.  After the procedure, the patient rated their pain every 30 minutes and will continue rating at this frequency for a total of 5 hours.  The patient has been asked to complete the Diary and return to [place] by mail, fax or hand delivered as soon as possible.   Additional Comments:  The patient tolerated the procedure well Dressing: Band-Aid    Post-procedure details: Patient was observed during the procedure. Post-procedure instructions were reviewed.  Patient left the clinic in stable condition.

## 2018-09-19 NOTE — Progress Notes (Signed)
Andrew Pacheco - 77 y.o. male [name] [id number]  Date of birth: 05-28-1941  Office Visit Note: Visit Date: 09/11/2018 PCP: [name], MD Referred by: [name], MD  Subjective: Chief Complaint  Patient presents with  . Lower Back - Pain  . Right Leg - Pain   HPI: Andrew Pacheco is a 77 year old gentleman who comes in today for planned bilateral L4-5 and L5-S1 facet joint blocks.  Please see our prior evaluation and management note for further details and justification.   ROS Otherwise per HPI.  Assessment & Plan: Visit Diagnoses:  1. Spondylosis without myelopathy or radiculopathy, lumbar region     Plan: No additional findings.   Meds & Orders:  Meds ordered this encounter  Medications  . methylPREDNISolone acetate (DEPO-MEDROL) injection 80 mg    Orders Placed This Encounter  Procedures  . Facet Injection  . XR C-ARM NO REPORT    Follow-up: Return if symptoms worsen or fail to improve.   Procedures: No procedures performed  Lumbar Diagnostic Facet Joint Nerve Block with Fluoroscopic Guidance   Patient: Andrew Pacheco      Date of Birth: 12-Dec-1941 [name]: [id number] PCP: [name], MD      Visit Date: 09/11/2018   Universal Protocol:    Date/Time: 10/01/195:17 AM  Consent Given By: the patient  Position: PRONE  Additional Comments: Vital signs were monitored before and after the procedure. Patient was prepped and draped in the usual sterile fashion. The correct patient, procedure, and site was verified.   Injection Procedure Details:  Procedure Site One Meds Administered:  Meds ordered this encounter  Medications  . methylPREDNISolone acetate (DEPO-MEDROL) injection 80 mg     Laterality: Bilateral  Location/Site:  L4-L5 L5-S1  Needle size: 22 ga.  Needle type:spinal  Needle Placement: Oblique pedical  Findings:   -Comments: There was excellent flow of contrast along the articular pillars without intravascular flow.  Procedure  Details: The fluoroscope beam is vertically oriented in AP and then obliqued 15 to 20 degrees to the ipsilateral side of the desired nerve to achieve the "Scotty dog" appearance.  The skin over the target area of the junction of the superior articulating process and the transverse process (sacral ala if blocking the L5 dorsal rami) was locally anesthetized with a 1 ml volume of 1% Lidocaine without Epinephrine.  The spinal needle was inserted and advanced in a trajectory view down to the target.   After contact with periosteum and negative aspirate for blood and CSF, correct placement without intravascular or epidural spread was confirmed by injecting 0.5 ml. of Isovue-250.  A spot radiograph was obtained of this image.    Next, a 0.5 ml. volume of the injectate described above was injected. The needle was then redirected to the other facet joint nerves mentioned above if needed.  Prior to the procedure, the patient was given a Pain Diary which was completed for baseline measurements.  After the procedure, the patient rated their pain every 30 minutes and will continue rating at this frequency for a total of 5 hours.  The patient has been asked to complete the Diary and return to [place] by mail, fax or hand delivered as soon as possible.   Additional Comments:  The patient tolerated the procedure well Dressing: Band-Aid    Post-procedure details: Patient was observed during the procedure. Post-procedure instructions were reviewed.  Patient left the clinic in stable condition.   Clinical History: Post myelogram CT of the lumbar spine: 2019  Comparison:  MR 06/08/2006.   Findings: Mild atherosclerotic type changes of the aorta branch vessels.  Small hiatal hernia.   L1-2:  Conus is at the level.  No abnormal vessels.  Central protrusion with mild indentation upon the thecal sac.  There may be left posterior lateral cephalad  extension of disc material as there is broad-based impression on  the left ventral aspect of thecal sac at the mid to lower L1 level.  Epidural process such as hematoma or tumor felt to be less likely considerations.   L2-3:  No significant spinal stenosis or foraminal narrowing.   L3-4:  Hemangioma L3 vertebra unchanged.  Bulge with superimposed shallow broad-based right lateral protrusion with mild indentation on the right lateral aspect of the thecal sac.   L4-5:  Mild bulge.  Mild bilateral facet joint degenerative changes.  Very mild spinal stenosis.   L5-S1:  Mild to moderate bilateral facet joint degenerative changes.  Minimal anterior slip of L5 upon S1.  Bulge with mild spur.  Mild to slightly moderate bilateral foraminal narrowing.   Impression:  Degenerative changes throughout the lumbar spine as noted above.  Most significant change since the prior MR scan is that noted at the L1-2 level as described above.  Provider: Hubbard Robinson     Objective:  VS:  HT:    WT:   BMI:     BP:(!) 148/75  HR:61bpm  TEMP:98 F (36.7 C)(Oral)  RESP:  Physical Exam  Ortho Exam Imaging: No results found.

## 2018-11-21 ENCOUNTER — Other Ambulatory Visit: Payer: Self-pay | Admitting: Internal Medicine

## 2018-11-21 DIAGNOSIS — M533 Sacrococcygeal disorders, not elsewhere classified: Secondary | ICD-10-CM

## 2018-11-28 ENCOUNTER — Ambulatory Visit
Admission: RE | Admit: 2018-11-28 | Discharge: 2018-11-28 | Disposition: A | Discharge status: Home / Self Care | Payer: Medicare Other | Source: Ambulatory Visit | Attending: Internal Medicine | Admitting: Internal Medicine

## 2018-11-28 DIAGNOSIS — M533 Sacrococcygeal disorders, not elsewhere classified: Secondary | ICD-10-CM

## 2018-11-28 MED ORDER — METHYLPREDNISOLONE ACETATE 40 MG/ML INJ SUSP (RADIOLOG
120.0000 mg | Freq: Once | INTRAMUSCULAR | Status: AC
  Administered 2018-11-28: 120 mg via INTRA_ARTICULAR

## 2018-11-28 MED ORDER — IOPAMIDOL (ISOVUE-M 200) INJECTION 41%
1.0000 mL | Freq: Once | INTRAMUSCULAR | Status: AC
  Administered 2018-11-28: 1 mL via INTRA_ARTICULAR

## 2018-11-28 NOTE — Discharge Instructions (Signed)

## 2018-12-29 ENCOUNTER — Other Ambulatory Visit: Payer: Self-pay | Admitting: Internal Medicine

## 2019-03-29 ENCOUNTER — Other Ambulatory Visit: Payer: Self-pay

## 2019-03-29 DIAGNOSIS — K259 Gastric ulcer, unspecified as acute or chronic, without hemorrhage or perforation: Secondary | ICD-10-CM

## 2019-03-29 MED ORDER — PANTOPRAZOLE SODIUM 40 MG PO TBEC: 40.0000 mg | DELAYED_RELEASE_TABLET | Freq: Every day | ORAL | 0 refills | Status: DC

## 2019-05-04 ENCOUNTER — Telehealth: Payer: Self-pay | Admitting: Physical Medicine and Rehabilitation

## 2019-05-07 NOTE — Telephone Encounter (Signed)
Is auth needed? Scheduled for 6/2 with driver.

## 2019-05-07 NOTE — Telephone Encounter (Signed)
ok 

## 2019-05-08 NOTE — Telephone Encounter (Signed)
No pa is needed for procedure 64493-50, pt has medicare.

## 2019-05-22 ENCOUNTER — Ambulatory Visit: Payer: Self-pay

## 2019-05-22 ENCOUNTER — Ambulatory Visit: Payer: Medicare Other | Admitting: Physical Medicine and Rehabilitation

## 2019-05-22 ENCOUNTER — Other Ambulatory Visit: Payer: Self-pay

## 2019-05-22 ENCOUNTER — Encounter: Payer: Self-pay | Admitting: Physical Medicine and Rehabilitation

## 2019-05-22 VITALS — BP 149/89 | HR 63

## 2019-05-22 DIAGNOSIS — M47816 Spondylosis without myelopathy or radiculopathy, lumbar region: Secondary | ICD-10-CM

## 2019-05-22 MED ORDER — METHYLPREDNISOLONE ACETATE 80 MG/ML IJ SUSP
80.0000 mg | Freq: Once | INTRAMUSCULAR | Status: AC
  Administered 2019-05-22: 80 mg

## 2019-05-22 NOTE — Progress Notes (Signed)
 .  Numeric Pain Rating Scale and Functional Assessment Average Pain 7   In the last MONTH (on 0-10 scale) has pain interfered with the following?  1. General activity like being  able to carry out your everyday physical activities such as walking, climbing stairs, carrying groceries, or moving a chair?  Rating(6)   +Driver, -BT, -Dye Allergies.  

## 2019-05-23 NOTE — Progress Notes (Signed)
Andrew Pacheco - 78 y.o. male [name] [id number]  Date of birth: 1941-12-06  Office Visit Note: Visit Date: 05/22/2019 PCP: [name], MD Referred by: [name], MD  Subjective: Chief Complaint  Patient presents with  . Lower Back - Pain   HPI:  Andrew Pacheco is a 78 y.o. male who comes in today For planned repeat bilateral L4-5 and L5-S1 facet joint block.  Prior injection in September offer him several months of good relief of his low back pain.  He continues to have thoracic pain and some pain into the left buttock area.  MRI has shown disc protrusion and degenerative change at L1-2 which could be a source of his buttock pain but he has no radicular pain down the legs.  He has no stenosis.  He has had physical therapy and chiropractic care in the past he has had medication management without relief.  He would be a good candidate for radiofrequency ablation but the facet joint blocks seem to last quite a while will get repeat that today.  We will see him back in a few weeks for potential L2 transforaminal injection if this just does not help his buttock pain and that severe enough.  Also eventually will need reevaluation for his thoracic pain.  He was seen in the past by Dr. 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  ROS Otherwise per HPI.  Assessment & Plan: Visit Diagnoses:  1. Spondylosis without myelopathy or radiculopathy, lumbar region     Plan: No additional findings.   Meds & Orders:  Meds ordered this encounter  Medications  . methylPREDNISolone acetate (DEPO-MEDROL) injection 80 mg    Orders Placed This Encounter  Procedures  . Facet Injection  . XR C-ARM NO REPORT    Follow-up: Return if symptoms worsen or fail to improve, for Consider L2 transforaminal steroid injection.   Procedures: No procedures performed  Lumbar Facet Joint Intra-Articular Injection(s) with Fluoroscopic Guidance  Patient: Andrew Pacheco      Date of  Birth: 19-Jun-1941 [name]: [id number] PCP: [name], MD      Visit Date: 05/22/2019   Universal Protocol:    Date/Time: 05/22/2019  Consent Given By: the patient  Position: PRONE   Additional Comments: Vital signs were monitored before and after the procedure. Patient was prepped and draped in the usual sterile fashion. The correct patient, procedure, and site was verified.   Injection Procedure Details:  Procedure Site One Meds Administered:  Meds ordered this encounter  Medications  . methylPREDNISolone acetate (DEPO-MEDROL) injection 80 mg     Laterality: Bilateral  Location/Site:  L4-L5 L5-S1  Needle size: 22 guage  Needle type: Spinal  Needle Placement: Articular  Findings:  -Comments: Excellent flow of contrast producing a partial arthrogram.  Procedure Details: The fluoroscope beam is vertically oriented in AP, and the inferior recess is visualized beneath the lower pole of the inferior apophyseal process, which represents the target point for needle insertion. When direct visualization is difficult the target point is located at the medial projection of the vertebral pedicle. The region overlying each aforementioned target is locally anesthetized with a 1 to 2 ml. volume of 1% Lidocaine without Epinephrine.   The spinal needle was inserted into each of the above mentioned facet joints using biplanar fluoroscopic guidance. A 0.25 to 0.5 ml. volume of Isovue-250 was injected and a partial facet joint arthrogram was obtained. A single spot film was obtained of the  resulting arthrogram.    One to 1.25 ml of the steroid/anesthetic solution was then injected into each of the facet joints noted above.   Additional Comments:  The patient tolerated the procedure well Dressing: 2 x 2 sterile gauze and Band-Aid    Post-procedure details: Patient was observed during the procedure. Post-procedure instructions were reviewed.  Patient left the clinic in stable  condition.    Clinical History: Post myelogram CT of the lumbar spine: 2019   Comparison:  MR 06/08/2006.   Findings: Mild atherosclerotic type changes of the aorta branch vessels.  Small hiatal hernia.   L1-2:  Conus is at the level.  No abnormal vessels.  Central protrusion with mild indentation upon the thecal sac.  There may be left posterior lateral cephalad  extension of disc material as there is broad-based impression on the left ventral aspect of thecal sac at the mid to lower L1 level.  Epidural process such as hematoma or tumor felt to be less likely considerations.   L2-3:  No significant spinal stenosis or foraminal narrowing.   L3-4:  Hemangioma L3 vertebra unchanged.  Bulge with superimposed shallow broad-based right lateral protrusion with mild indentation on the right lateral aspect of the thecal sac.   L4-5:  Mild bulge.  Mild bilateral facet joint degenerative changes.  Very mild spinal stenosis.   L5-S1:  Mild to moderate bilateral facet joint degenerative changes.  Minimal anterior slip of L5 upon S1.  Bulge with mild spur.  Mild to slightly moderate bilateral foraminal narrowing.   Impression:  Degenerative changes throughout the lumbar spine as noted above.  Most significant change since the prior MR scan is that noted at the L1-2 level as described above.  Provider: Hubbard Robinson     Objective:  VS:  HT:    WT:   BMI:     BP:(!) 149/89  HR:63bpm  TEMP: ( )  RESP:  Physical Exam Musculoskeletal:     Comments: Patient ambulates without aid with somewhat of a Trendelenburg gait.  He has no foot drop he has good distal strength.     Ortho Exam Imaging: Xr C-arm No Report  Result Date: 05/22/2019 Please see Notes tab for imaging impression.

## 2019-05-23 NOTE — Procedures (Signed)
Lumbar Facet Joint Intra-Articular Injection(s) with Fluoroscopic Guidance  Patient: Andrew Pacheco      Date of Birth: 12-06-1941 [name]: [id number] PCP: [name], MD      Visit Date: 05/22/2019   Universal Protocol:    Date/Time: 05/22/2019  Consent Given By: the patient  Position: PRONE   Additional Comments: Vital signs were monitored before and after the procedure. Patient was prepped and draped in the usual sterile fashion. The correct patient, procedure, and site was verified.   Injection Procedure Details:  Procedure Site One Meds Administered:  Meds ordered this encounter  Medications  . methylPREDNISolone acetate (DEPO-MEDROL) injection 80 mg     Laterality: Bilateral  Location/Site:  L4-L5 L5-S1  Needle size: 22 guage  Needle type: Spinal  Needle Placement: Articular  Findings:  -Comments: Excellent flow of contrast producing a partial arthrogram.  Procedure Details: The fluoroscope beam is vertically oriented in AP, and the inferior recess is visualized beneath the lower pole of the inferior apophyseal process, which represents the target point for needle insertion. When direct visualization is difficult the target point is located at the medial projection of the vertebral pedicle. The region overlying each aforementioned target is locally anesthetized with a 1 to 2 ml. volume of 1% Lidocaine without Epinephrine.   The spinal needle was inserted into each of the above mentioned facet joints using biplanar fluoroscopic guidance. A 0.25 to 0.5 ml. volume of Isovue-250 was injected and a partial facet joint arthrogram was obtained. A single spot film was obtained of the resulting arthrogram.    One to 1.25 ml of the steroid/anesthetic solution was then injected into each of the facet joints noted above.   Additional Comments:  The patient tolerated the procedure well Dressing: 2 x 2 sterile gauze and Band-Aid    Post-procedure details: Patient was  observed during the procedure. Post-procedure instructions were reviewed.  Patient left the clinic in stable condition.

## 2019-06-07 ENCOUNTER — Encounter: Payer: Medicare Other | Admitting: Physical Medicine and Rehabilitation

## 2019-06-12 ENCOUNTER — Ambulatory Visit (INDEPENDENT_AMBULATORY_CARE_PROVIDER_SITE_OTHER): Payer: Medicare Other | Admitting: Physical Medicine and Rehabilitation

## 2019-06-12 ENCOUNTER — Other Ambulatory Visit: Payer: Self-pay

## 2019-06-12 ENCOUNTER — Encounter: Payer: Self-pay | Admitting: Physical Medicine and Rehabilitation

## 2019-06-12 ENCOUNTER — Ambulatory Visit: Payer: Self-pay

## 2019-06-12 VITALS — BP 157/102 | HR 87 | Temp 98.2°F

## 2019-06-12 DIAGNOSIS — M5416 Radiculopathy, lumbar region: Secondary | ICD-10-CM

## 2019-06-12 MED ORDER — DEXAMETHASONE SODIUM PHOSPHATE 10 MG/ML IJ SOLN: 15.0000 mg | Freq: Once | INTRAMUSCULAR | Status: DC

## 2019-06-12 NOTE — Progress Notes (Signed)
   Numeric Pain Rating Scale and Functional Assessment Average Pain 3   In the last MONTH (on 0-10 scale) has pain interfered with the following?  1. General activity like being  able to carry out your everyday physical activities such as walking, climbing stairs, carrying groceries, or moving a chair?  Rating()   +Driver, -BT, -Dye Allergies.

## 2019-06-21 ENCOUNTER — Encounter: Payer: Self-pay | Admitting: Physical Medicine and Rehabilitation

## 2019-06-21 ENCOUNTER — Other Ambulatory Visit: Payer: Self-pay

## 2019-06-21 DIAGNOSIS — K259 Gastric ulcer, unspecified as acute or chronic, without hemorrhage or perforation: Secondary | ICD-10-CM

## 2019-06-21 MED ORDER — PANTOPRAZOLE SODIUM 40 MG PO TBEC: 40.0000 mg | DELAYED_RELEASE_TABLET | Freq: Every day | ORAL | 0 refills | Status: DC

## 2019-06-21 NOTE — Progress Notes (Signed)
Andrew Pacheco - 78 y.o. male [name] [id number]  Date of birth: [birth year]  Office Visit Note: Visit Date: 06/12/2019 PCP: [name], MD Referred by: [name], MD  Subjective: Chief Complaint  Patient presents with  . Middle Back - Pain  . Left Hip - Pain   HPI:  Andrew Pacheco is a 78 y.o. male who comes in today For planned left L2 transforaminal epidural steroid injection.  This is for recalcitrant left buttock pain some referral anteriorly.  He has had good diagnostic blocks at L4-5 and L5-S1 and in fact many years ago at that it did well.  He has had double diagnostic blocks for his axial back pain but this does not seem to get at this buttock pain.  He meets the criteria for radiofrequency ablation of the lumbar spine which I think we should look at at some point.  He has had physical therapy in the past but it may be something we need to look at in terms of this buttock pain as well.  I do want to try 1 time a diagnostic L2 injection for an area where he has a lot of degenerative changes and some lateral recess narrowing.  If he does not get relief with that then I think regrouping with a physical therapist for his buttock pain and continued efforts on the low back pain would be the appropriate course of action.  He continues to have thoracic pain that really bothers him in the upper middle back.  He has seen Dr. Ellene Route in the past and we documented this.  I would like to see him back for a full office visit to just contemplate what to do about his upper back pain which is felt to be somewhat myofascial and somewhat may be related to some disc protrusions that he has had in the past.  ROS Otherwise per HPI.  Assessment & Plan: Visit Diagnoses:  1. Lumbar radiculopathy     Plan: No additional findings.   Meds & Orders:  Meds ordered this encounter  Medications  . dexamethasone (DECADRON) injection 15 mg    Orders Placed This Encounter  Procedures  . XR C-ARM NO REPORT   . Epidural Steroid injection    Follow-up: No follow-ups on file.   Procedures: No procedures performed  Lumbosacral Transforaminal Epidural Steroid Injection - Sub-Pedicular Approach with Fluoroscopic Guidance  Patient: Andrew Pacheco      Date of Birth: [birth year] [name]: [id number] PCP: [name], MD      Visit Date: 06/12/2019   Universal Protocol:    Date/Time: 06/12/2019  Consent Given By: the patient  Position: PRONE  Additional Comments: Vital signs were monitored before and after the procedure. Patient was prepped and draped in the usual sterile fashion. The correct patient, procedure, and site was verified.   Injection Procedure Details:  Procedure Site One Meds Administered:  Meds ordered this encounter  Medications  . dexamethasone (DECADRON) injection 15 mg    Laterality: Left  Location/Site:  L2-L3  Needle size: 22 G  Needle type: Spinal  Needle Placement: Transforaminal  Findings:    -Comments: Excellent flow of contrast along the nerve and into the epidural space.  Procedure Details: After squaring off the end-plates to get a true AP view, the C-arm was positioned so that an oblique view of the foramen as noted above was visualized. The target area is just inferior to the "nose of the scotty dog" or sub pedicular. The soft tissues  overlying this structure were infiltrated with 2-3 ml. of 1% Lidocaine without Epinephrine.  The spinal needle was inserted toward the target using a "trajectory" view along the fluoroscope beam.  Under AP and lateral visualization, the needle was advanced so it did not puncture dura and was located close the 6 O'Clock position of the pedical in AP tracterory. Biplanar projections were used to confirm position. Aspiration was confirmed to be negative for CSF and/or blood. A 1-2 ml. volume of Isovue-250 was injected and flow of contrast was noted at each level. Radiographs were obtained for documentation purposes.    After attaining the desired flow of contrast documented above, a 0.5 to 1.0 ml test dose of 0.25% Marcaine was injected into each respective transforaminal space.  The patient was observed for 90 seconds post injection.  After no sensory deficits were reported, and normal lower extremity motor function was noted,   the above injectate was administered so that equal amounts of the injectate were placed at each foramen (level) into the transforaminal epidural space.   Additional Comments:  The patient tolerated the procedure well Dressing: 2 x 2 sterile gauze and Band-Aid    Post-procedure details: Patient was observed during the procedure. Post-procedure instructions were reviewed.  Patient left the clinic in stable condition.    Clinical History: Post myelogram CT of the lumbar spine: 2019   Comparison:  MR 06/08/2006.   Findings: Mild atherosclerotic type changes of the aorta branch vessels.  Small hiatal hernia.   L1-2:  Conus is at the level.  No abnormal vessels.  Central protrusion with mild indentation upon the thecal sac.  There may be left posterior lateral cephalad  extension of disc material as there is broad-based impression on the left ventral aspect of thecal sac at the mid to lower L1 level.  Epidural process such as hematoma or tumor felt to be less likely considerations.   L2-3:  No significant spinal stenosis or foraminal narrowing.   L3-4:  Hemangioma L3 vertebra unchanged.  Bulge with superimposed shallow broad-based right lateral protrusion with mild indentation on the right lateral aspect of the thecal sac.   L4-5:  Mild bulge.  Mild bilateral facet joint degenerative changes.  Very mild spinal stenosis.   L5-S1:  Mild to moderate bilateral facet joint degenerative changes.  Minimal anterior slip of L5 upon S1.  Bulge with mild spur.  Mild to slightly moderate bilateral foraminal narrowing.   Impression:  Degenerative changes throughout the lumbar  spine as noted above.  Most significant change since the prior MR scan is that noted at the L1-2 level as described above.  Provider: Hubbard Robinson     Objective:  VS:  HT:    WT:   BMI:     BP:(!) 157/102  HR:87bpm  TEMP:98.2 F (36.8 C)( )  RESP:99 % Physical Exam  Ortho Exam Imaging: No results found.

## 2019-06-21 NOTE — Procedures (Signed)
Lumbosacral Transforaminal Epidural Steroid Injection - Sub-Pedicular Approach with Fluoroscopic Guidance  Patient: Andrew Pacheco      Date of Birth: [birth year] [name]: [id number] PCP: [name], MD      Visit Date: 06/12/2019   Universal Protocol:    Date/Time: 06/12/2019  Consent Given By: the patient  Position: PRONE  Additional Comments: Vital signs were monitored before and after the procedure. Patient was prepped and draped in the usual sterile fashion. The correct patient, procedure, and site was verified.   Injection Procedure Details:  Procedure Site One Meds Administered:  Meds ordered this encounter  Medications  . dexamethasone (DECADRON) injection 15 mg    Laterality: Left  Location/Site:  L2-L3  Needle size: 22 G  Needle type: Spinal  Needle Placement: Transforaminal  Findings:    -Comments: Excellent flow of contrast along the nerve and into the epidural space.  Procedure Details: After squaring off the end-plates to get a true AP view, the C-arm was positioned so that an oblique view of the foramen as noted above was visualized. The target area is just inferior to the "nose of the scotty dog" or sub pedicular. The soft tissues overlying this structure were infiltrated with 2-3 ml. of 1% Lidocaine without Epinephrine.  The spinal needle was inserted toward the target using a "trajectory" view along the fluoroscope beam.  Under AP and lateral visualization, the needle was advanced so it did not puncture dura and was located close the 6 O'Clock position of the pedical in AP tracterory. Biplanar projections were used to confirm position. Aspiration was confirmed to be negative for CSF and/or blood. A 1-2 ml. volume of Isovue-250 was injected and flow of contrast was noted at each level. Radiographs were obtained for documentation purposes.   After attaining the desired flow of contrast documented above, a 0.5 to 1.0 ml test dose of 0.25% Marcaine was  injected into each respective transforaminal space.  The patient was observed for 90 seconds post injection.  After no sensory deficits were reported, and normal lower extremity motor function was noted,   the above injectate was administered so that equal amounts of the injectate were placed at each foramen (level) into the transforaminal epidural space.   Additional Comments:  The patient tolerated the procedure well Dressing: 2 x 2 sterile gauze and Band-Aid    Post-procedure details: Patient was observed during the procedure. Post-procedure instructions were reviewed.  Patient left the clinic in stable condition.

## 2019-06-25 ENCOUNTER — Other Ambulatory Visit: Payer: Self-pay

## 2019-09-09 IMAGING — CT CT HEAD W/O CM
4 series · 15 of 47 positions shown, 17 images · non-contrast
Comparison: None.

CLINICAL DATA: Altered level of consciousness

EXAM:
CT HEAD WITHOUT CONTRAST
TECHNIQUE: Contiguous axial images were obtained from the base of the skull
through the vertex without intravenous contrast.

[Series 3: head wo · axial · 0.45mm/px · z∈[-108,+22]mm · 7 of 36 slices shown, 9 images]
[im 5/36  brain]
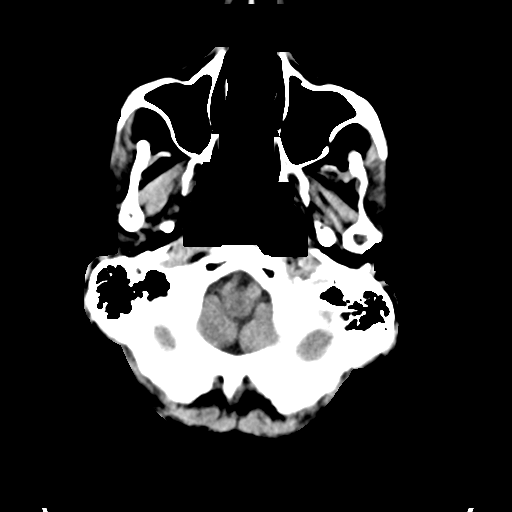
[im 5/36  bone]
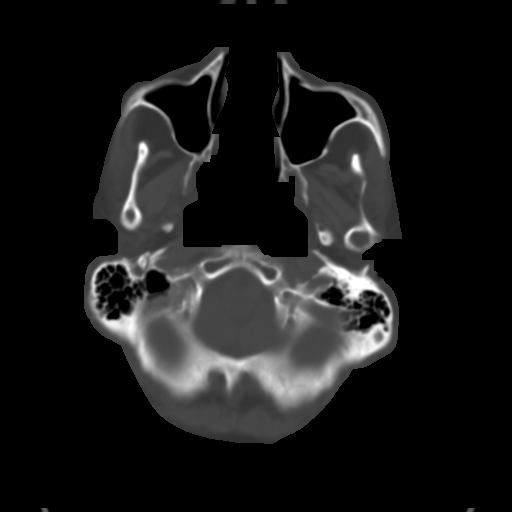
[im 9/36  brain]
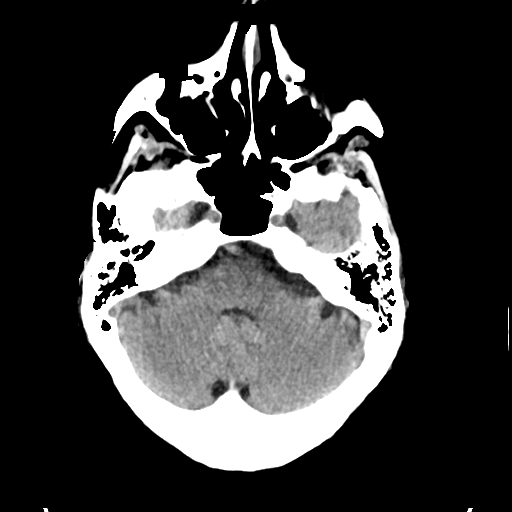
[im 14/36  brain]
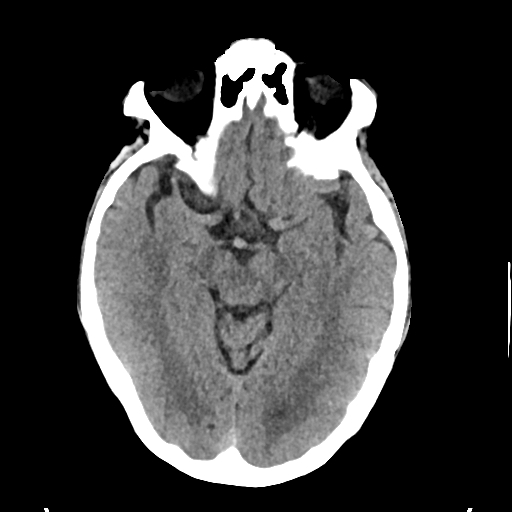
[im 18/36  brain]
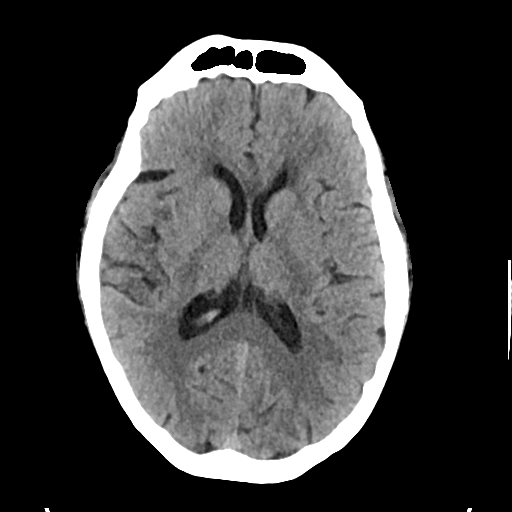
[im 22/36  brain]
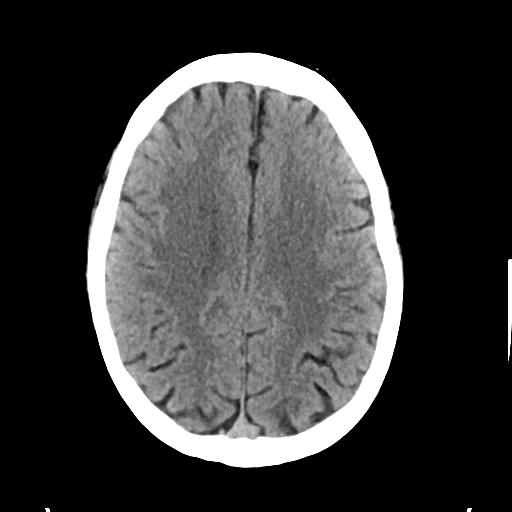
[im 22/36  bone]
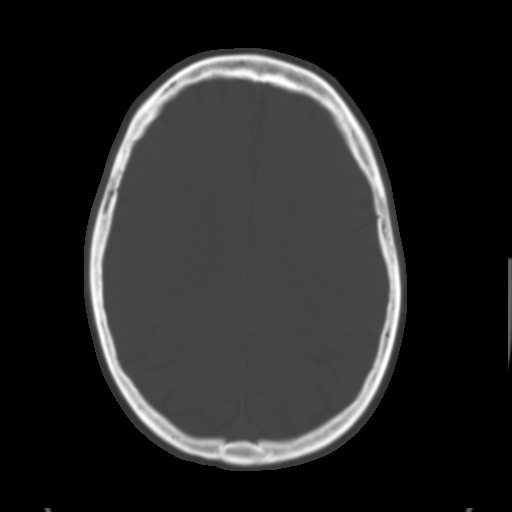
[im 27/36  brain]
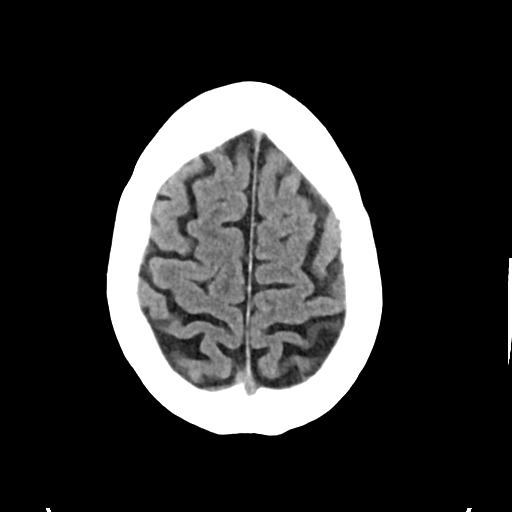
[im 31/36  brain]
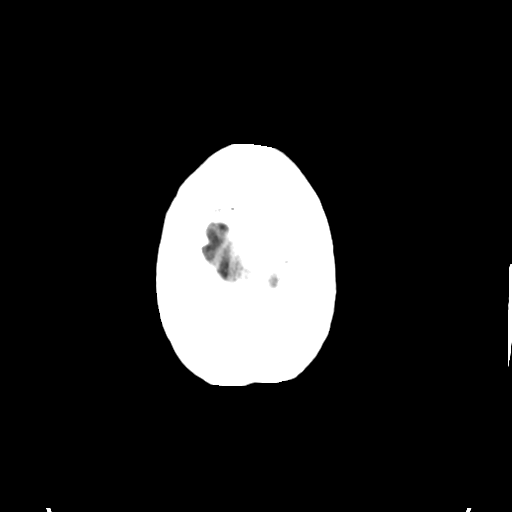

[Series 4: head bone · axial · 0.45mm/px · z∈[-112,-94]mm · 2 of 89 slices shown]
[im 9/89  bone]
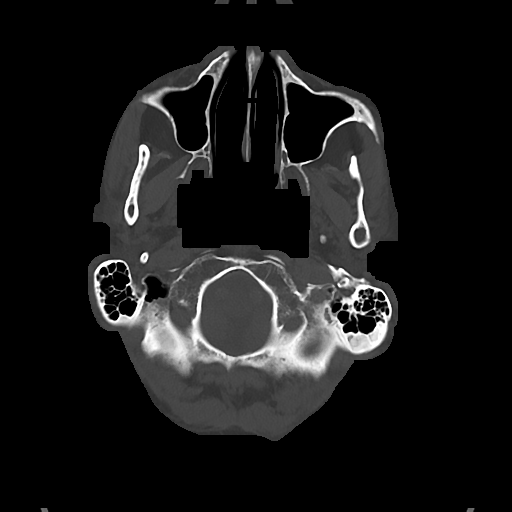
[im 18/89  bone]
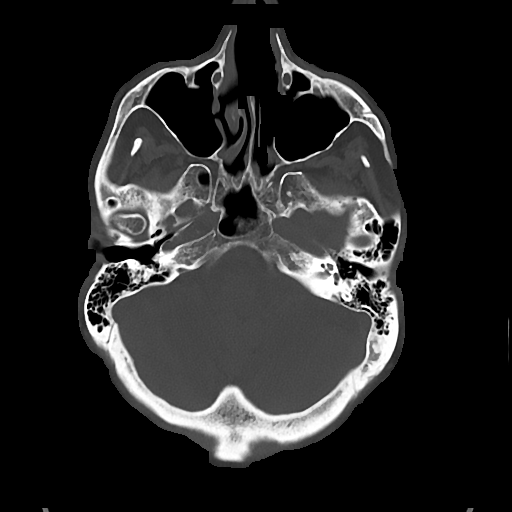

[Series 5: cor soft · coronal · 0.35mm/px · 3 of 78 slices shown]
[im 26/78  brain]
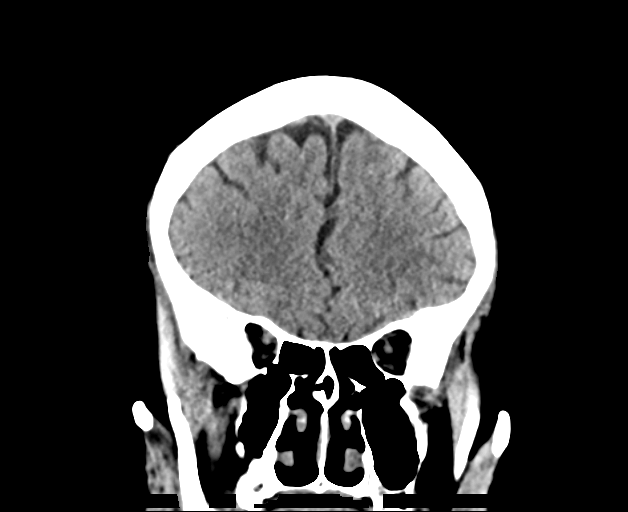
[im 35/78  brain]
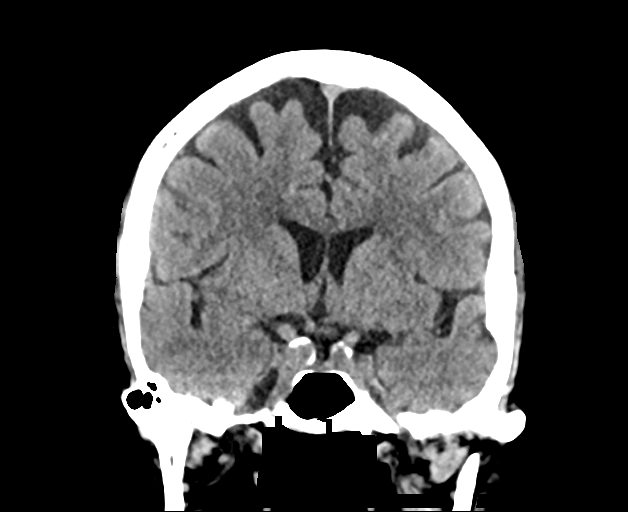
[im 43/78  brain]
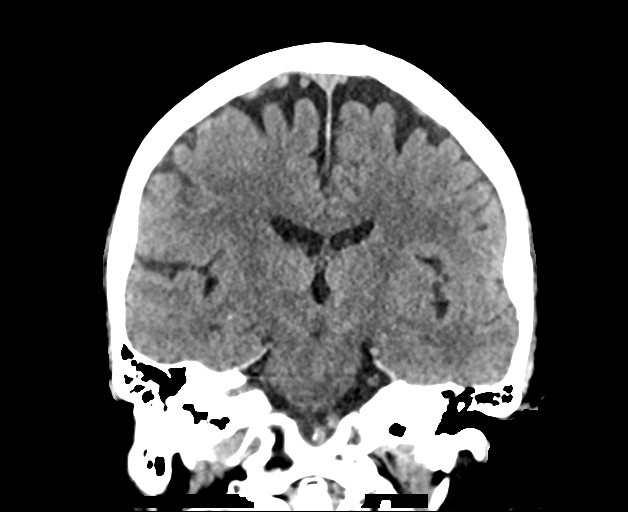

[Series 6: sag soft · sagittal · 0.35mm/px · 3 of 67 slices shown]
[im 23/67  brain]
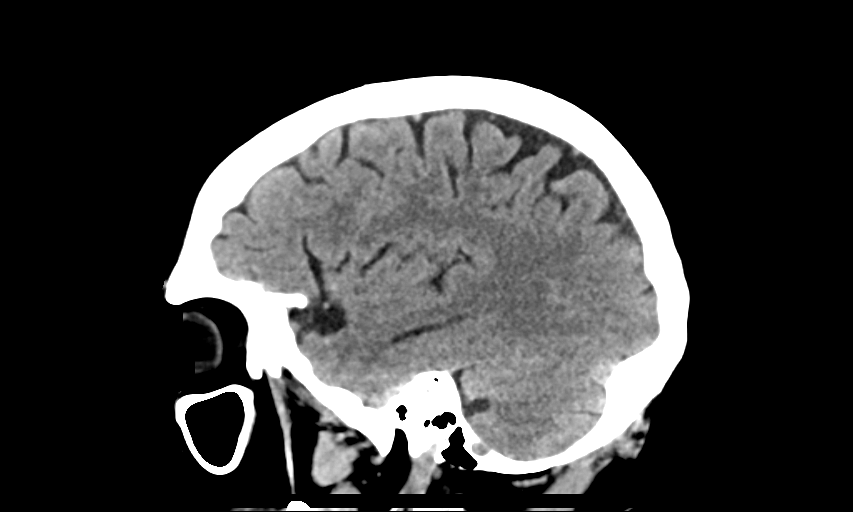
[im 34/67  brain]
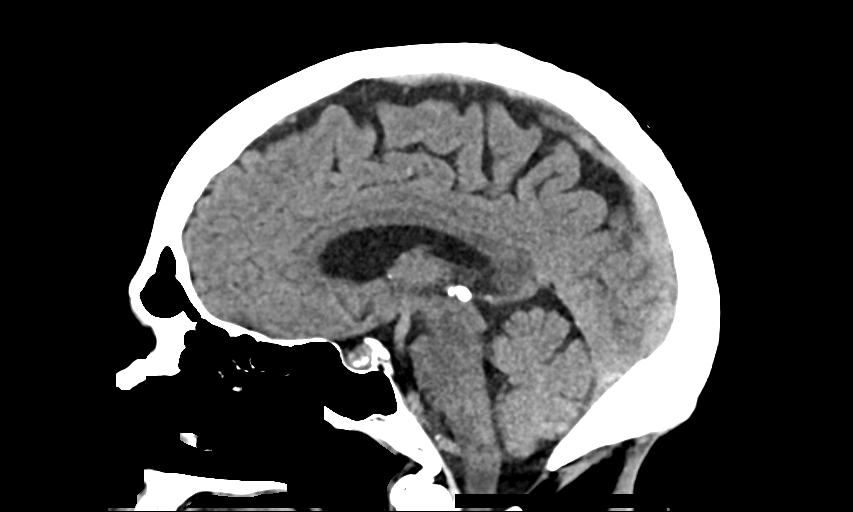
[im 45/67  brain]
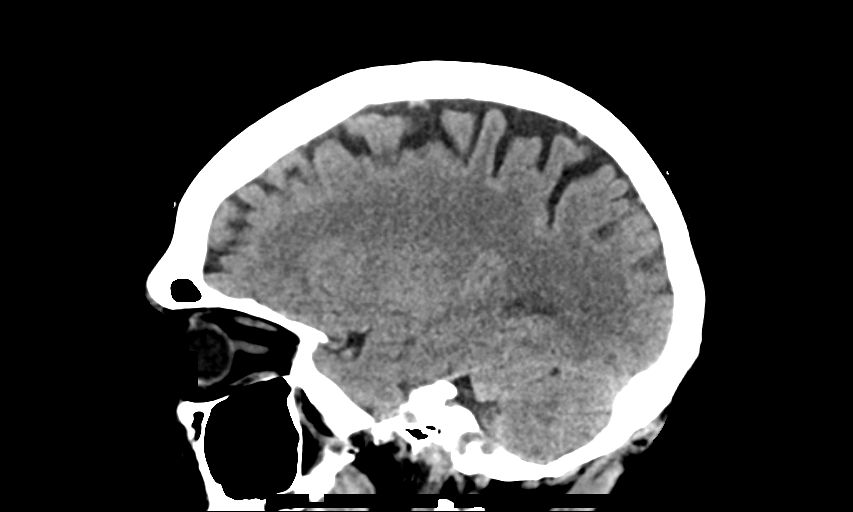

[15 of 47 positions shown; findings below may reference images not displayed]

FINDINGS: Brain: No acute territorial infarction, hemorrhage or intracranial
mass is seen. Mild white matter hypodensity consistent with small
vessel ischemic change. Ventricles are nonenlarged. Mild atrophy.

Vascular: No hyperdense vessels. Vertebral and carotid artery
calcification.

Skull: No fracture or suspicious bone lesion

Sinuses/Orbits: Mild mucosal thickening in the ethmoid sinuses. No
acute orbital abnormality

Other: None
IMPRESSION: No CT evidence for acute intracranial abnormality. Atrophy and mild
small vessel ischemic changes of the white matter.

## 2019-11-07 ENCOUNTER — Telehealth: Payer: Self-pay | Admitting: Physical Medicine and Rehabilitation

## 2019-11-07 NOTE — Telephone Encounter (Signed)
Ok, make sure he has not been taking any Pen[place]aid by mouth

## 2019-11-08 NOTE — Telephone Encounter (Signed)
Scheduled for 12/15 with driver.

## 2019-11-26 ENCOUNTER — Ambulatory Visit: Payer: Medicare Other | Admitting: Physical Medicine and Rehabilitation

## 2019-11-26 ENCOUNTER — Ambulatory Visit: Payer: Self-pay

## 2019-11-26 ENCOUNTER — Encounter: Payer: Self-pay | Admitting: Physical Medicine and Rehabilitation

## 2019-11-26 ENCOUNTER — Other Ambulatory Visit: Payer: Self-pay

## 2019-11-26 VITALS — BP 166/88 | HR 76

## 2019-11-26 DIAGNOSIS — M5416 Radiculopathy, lumbar region: Secondary | ICD-10-CM | POA: Diagnosis not present

## 2019-11-26 MED ORDER — DEXAMETHASONE SODIUM PHOSPHATE 10 MG/ML IJ SOLN: 15.0000 mg | Freq: Once | INTRAMUSCULAR | Status: DC

## 2019-11-26 NOTE — Progress Notes (Signed)
Numeric Pain Rating Scale and Functional Assessment Average Pain 10   In the last MONTH (on 0-10 scale) has pain interfered with the following?  1. General activity like being  able to carry out your everyday physical activities such as walking, climbing stairs, carrying groceries, or moving a chair?  Rating(10)   +Driver, -BT, -Dye Allergies. 

## 2019-11-27 NOTE — Procedures (Signed)
Lumbar Facet Joint Intra-Articular Injection(s) with Fluoroscopic Guidance  Patient: Andrew Pacheco      Date of Birth: October 27, 1941 [name]: [id number] PCP: [name], MD      Visit Date: 11/26/2019   Universal Protocol:    Date/Time: 11/26/2019  Consent Given By: the patient  Position: PRONE   Additional Comments: Vital signs were monitored before and after the procedure. Patient was prepped and draped in the usual sterile fashion. The correct patient, procedure, and site was verified.   Injection Procedure Details:  Procedure Site One Meds Administered:  Meds ordered this encounter  Medications  . dexamethasone (DECADRON) injection 15 mg     Laterality: Bilateral  Location/Site:  L4-L5 L5-S1  Needle size: 22 guage  Needle type: Spinal  Needle Placement: Articular  Findings:  -Comments: Excellent flow of contrast producing a partial arthrogram.  Procedure Details: The fluoroscope beam is vertically oriented in AP, and the inferior recess is visualized beneath the lower pole of the inferior apophyseal process, which represents the target point for needle insertion. When direct visualization is difficult the target point is located at the medial projection of the vertebral pedicle. The region overlying each aforementioned target is locally anesthetized with a 1 to 2 ml. volume of 1% Lidocaine without Epinephrine.   The spinal needle was inserted into each of the above mentioned facet joints using biplanar fluoroscopic guidance. A 0.25 to 0.5 ml. volume of Isovue-250 was injected and a partial facet joint arthrogram was obtained. A single spot film was obtained of the resulting arthrogram.    One to 1.25 ml of the steroid/anesthetic solution was then injected into each of the facet joints noted above.   Additional Comments:  The patient tolerated the procedure well Dressing: 2 x 2 sterile gauze and Band-Aid    Post-procedure details: Patient was observed during the  procedure. Post-procedure instructions were reviewed.  Patient left the clinic in stable condition.

## 2019-11-27 NOTE — Progress Notes (Signed)
Andrew Pacheco - 78 y.o. male [name] [id number]  Date of birth: 1941-11-27  Office Visit Note: Visit Date: 11/26/2019 PCP: [name], MD Referred by: [name], MD  Subjective: Chief Complaint  Patient presents with  . Lower Back - Pain   HPI: Andrew Pacheco is a 77 y.o. male who comes in today For planned L4-5 and L5-S1 facet joint blocks.  Patient's history is such that he has had chronic low back pain worse with standing and movement better at rest without any specific history of back injury or trauma or surgery.  Intermittently he would have 1 to 2 injections over the course of the year and do quite well.  This past year we had a little bit of a hiccup where the injection helped his lower back but he was still having issues with left buttock pain and thigh pain.  Myelogram of the thoracolumbar spine which were performed last year in 2019 did show disc herniation extrusion at L1-2.  In June of this year we did complete epidural injection at this level which did seem to help him quite a bit.  Today he is complaining mainly of low back pain right and left right at the lumbosacral junction and just above.  It is more mechanical pain worse with standing and movement.  No radicular pain into the thighs some in the left buttock region.  No paresthesias no focal weakness.  He has been doing well from a health standpoint without any new issues.  No red flag complaints.  I think the best approach is diagnostic and therapeutic facet joint blocks.  He will continue with home exercise routine that he is done religiously.  He has had multiple bouts of physical therapy and does try to stay active even at 78 years old.  ROS Otherwise per HPI.  Assessment & Plan: Visit Diagnoses:  1. Lumbar radiculopathy     Plan: No additional findings.   Meds & Orders:  Meds ordered this encounter  Medications  . DISCONTD: dexamethasone (DECADRON) injection 15 mg    Orders Placed This Encounter  Procedures   . XR C-ARM NO REPORT  . Epidural Steroid injection    Follow-up: Return if symptoms worsen or fail to improve.   Procedures: No procedures performed  Lumbar Facet Joint Intra-Articular Injection(s) with Fluoroscopic Guidance  Patient: Andrew Pacheco      Date of Birth: 13-Oct-1941 [name]: [id number] PCP: [name], MD      Visit Date: 11/26/2019   Universal Protocol:    Date/Time: 11/26/2019  Consent Given By: the patient  Position: PRONE   Additional Comments: Vital signs were monitored before and after the procedure. Patient was prepped and draped in the usual sterile fashion. The correct patient, procedure, and site was verified.   Injection Procedure Details:  Procedure Site One Meds Administered:  Meds ordered this encounter  Medications  . dexamethasone (DECADRON) injection 15 mg     Laterality: Bilateral  Location/Site:  L4-L5 L5-S1  Needle size: 22 guage  Needle type: Spinal  Needle Placement: Articular  Findings:  -Comments: Excellent flow of contrast producing a partial arthrogram.  Procedure Details: The fluoroscope beam is vertically oriented in AP, and the inferior recess is visualized beneath the lower pole of the inferior apophyseal process, which represents the target point for needle insertion. When direct visualization is difficult the target point is located at the medial projection of the vertebral pedicle. The region overlying each aforementioned target is locally anesthetized  with a 1 to 2 ml. volume of 1% Lidocaine without Epinephrine.   The spinal needle was inserted into each of the above mentioned facet joints using biplanar fluoroscopic guidance. A 0.25 to 0.5 ml. volume of Isovue-250 was injected and a partial facet joint arthrogram was obtained. A single spot film was obtained of the resulting arthrogram.    One to 1.25 ml of the steroid/anesthetic solution was then injected into each of the facet joints noted above.   Additional  Comments:  The patient tolerated the procedure well Dressing: 2 x 2 sterile gauze and Band-Aid    Post-procedure details: Patient was observed during the procedure. Post-procedure instructions were reviewed.  Patient left the clinic in stable condition.    Clinical History: Post myelogram CT of the lumbar spine: 2019   Comparison:  MR 06/08/2006.   Findings: Mild atherosclerotic type changes of the aorta branch vessels.  Small hiatal hernia.   L1-2:  Conus is at the level.  No abnormal vessels.  Central protrusion with mild indentation upon the thecal sac.  There may be left posterior lateral cephalad  extension of disc material as there is broad-based impression on the left ventral aspect of thecal sac at the mid to lower L1 level.  Epidural process such as hematoma or tumor felt to be less likely considerations.   L2-3:  No significant spinal stenosis or foraminal narrowing.   L3-4:  Hemangioma L3 vertebra unchanged.  Bulge with superimposed shallow broad-based right lateral protrusion with mild indentation on the right lateral aspect of the thecal sac.   L4-5:  Mild bulge.  Mild bilateral facet joint degenerative changes.  Very mild spinal stenosis.   L5-S1:  Mild to moderate bilateral facet joint degenerative changes.  Minimal anterior slip of L5 upon S1.  Bulge with mild spur.  Mild to slightly moderate bilateral foraminal narrowing.   Impression:  Degenerative changes throughout the lumbar spine as noted above.  Most significant change since the prior MR scan is that noted at the L1-2 level as described above.  Provider: Hubbard Robinson   He reports that he has never smoked. He has never used smokeless tobacco. No results for input(s): HGBA1C, LABURIC in the last 8760 hours.  Objective:  VS:  HT:    WT:   BMI:     BP:(!) 166/88  HR:76bpm  TEMP: ( )  RESP:  Physical Exam Constitutional:      General: He is not in acute distress.    Appearance: Normal  appearance. He is not ill-appearing.  HENT:     Head: Normocephalic and atraumatic.     Right Ear: External ear normal.     Left Ear: External ear normal.  Eyes:     Extraocular Movements: Extraocular movements intact.  Cardiovascular:     Rate and Rhythm: Normal rate.     Pulses: Normal pulses.  Abdominal:     General: There is no distension.     Palpations: Abdomen is soft.  Musculoskeletal:        General: No tenderness or signs of injury.     Right lower leg: No edema.     Left lower leg: No edema.     Comments: Patient has good distal strength without clonus.  He ambulates without aid.  He does have pain with extension rotation of the lumbar spine.  There is no active trigger points.  No pain over the greater trochanters.  Skin:    Findings: No erythema or rash.  Neurological:     General: No focal deficit present.     Mental Status: He is alert and oriented to person, place, and time.     Sensory: No sensory deficit.     Motor: No weakness or abnormal muscle tone.     Coordination: Coordination normal.  Psychiatric:        Mood and Affect: Mood normal.        Behavior: Behavior normal.     Ortho Exam Imaging: Xr C-arm No Report  Result Date: 11/26/2019 Please see Notes tab for imaging impression.   Past Medical/Family/Surgical/Social History: Medications & Allergies reviewed per EMR, new medications updated. Patient Active Problem List   Diagnosis Date Noted  . Vasodepressor syncope 09/11/2017  . Vasovagal near syncope 09/11/2017  . Low vitamin B12 level 09/10/2017  . HTN (hypertension) 09/10/2017  . Malnutrition of moderate degree 09/10/2017  . Gastroesophageal reflux disease   . AKI (acute kidney injury) ([place])   . Glaucoma   . Near syncope 09/08/2017  . 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  . ANEMIA-UNSPECIFIED 09/26/2009  . CONSTIPATION 09/26/2009  . NAUSEA 09/26/2009  . ABDOMINAL PAIN -GENERALIZED 09/26/2009   Past Medical History:  Diagnosis Date  . Anemia    . Arthritis    "both hips, back" (09/09/2017)  . Chronic back pain    "middle and lower back" (09/09/2017)  . Colon polyps   . Daily headache   . Diverticulosis   . GERD (gastroesophageal reflux disease)   . Glaucoma, both eyes   . H pylori ulcer   . Hard of hearing   . HTN (hypertension)   . 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 Lumbar disc disease   . Migraine    "q 3-4 months" (09/09/2017)  . Seasonal allergies    Family History  Problem Relation Age of Onset  . Heart disease Father   . Emphysema Father   . Diverticulosis Father   . Cancer Mother        gallbladder-mets  . Arthritis Mother   . Liver cancer Mother    Past Surgical History:  Procedure Laterality Date  . APPENDECTOMY    . CYST EXCISION Right ~ 1971   knee  . ESOPHAGOGASTRODUODENOSCOPY N/A 01/15/2018   Procedure: ESOPHAGOGASTRODUODENOSCOPY (EGD);  Surgeon: Ladene Artist, MD;  Location: [facility] ENDOSCOPY;  Service: Endoscopy;  Laterality: N/A;   Social History   Occupational History  . Occupation: security guard - [employer]  Tobacco Use  . Smoking status: Never Smoker  . Smokeless tobacco: Never Used  Substance and Sexual Activity  . Alcohol use: Yes    Comment: 09/09/2017 "couple glasses of wine q 6 months"  . Drug use: No  . Sexual activity: Not on file

## 2019-12-04 ENCOUNTER — Encounter: Payer: Medicare Other | Admitting: Physical Medicine and Rehabilitation

## 2020-01-21 ENCOUNTER — Ambulatory Visit: Payer: Medicare Other | Admitting: Internal Medicine

## 2020-02-14 ENCOUNTER — Ambulatory Visit: Payer: Medicare Other | Admitting: Internal Medicine

## 2020-02-14 ENCOUNTER — Encounter: Payer: Self-pay | Admitting: Internal Medicine

## 2020-02-14 VITALS — BP 112/72 | HR 68 | Temp 98.7°F | Ht 69.0 in | Wt 170.0 lb

## 2020-02-14 DIAGNOSIS — K219 Gastro-esophageal reflux disease without esophagitis: Secondary | ICD-10-CM

## 2020-02-14 DIAGNOSIS — K259 Gastric ulcer, unspecified as acute or chronic, without hemorrhage or perforation: Secondary | ICD-10-CM

## 2020-02-14 MED ORDER — PANTOPRAZOLE SODIUM 40 MG PO TBEC: 40.0000 mg | DELAYED_RELEASE_TABLET | Freq: Every day | ORAL | 3 refills | Status: DC

## 2020-02-14 NOTE — Progress Notes (Signed)
HISTORY OF PRESENT ILLNESS:  Andrew Pacheco is a 79 y.o. male with a history of gastric ulcer and anemia (iron deficiency).  Last seen April 03, 2018 for upper endoscopy.  Ulcer healing documented.  Last colonoscopy 2015.  Diverticulosis.  He presents today for routine follow-up.  Patient reports he has been doing well.  No reflux symptoms.  No problems with abdominal pain.  No melena or hematochezia.  He tells me that his blood counts have been checked by Dr. Sharlett Iles and are good.  No issues with his current medications.  His biggest complaint is chronic back pain for which she is on narcotics.  Some constipation related to the same  REVIEW OF SYSTEMS:  All non-GI ROS negative unless otherwise stated in the HPI except for sinus and allergy, back pain, hearing problems, sleeping problems  Past Medical History:  Diagnosis Date  . Anemia   . Arthritis    "both hips, back" (09/09/2017)  . Chronic back pain    "middle and lower back" (09/09/2017)  . Colon polyps   . Daily headache   . Diverticulosis   . GERD (gastroesophageal reflux disease)   . Glaucoma, both eyes   . H pylori ulcer   . Hard of hearing   . HTN (hypertension)   . 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 Lumbar disc disease   . Migraine    "q 3-4 months" (09/09/2017)  . Seasonal allergies     Past Surgical History:  Procedure Laterality Date  . APPENDECTOMY    . CYST EXCISION Right ~ 1971   knee  . ESOPHAGOGASTRODUODENOSCOPY N/A 01/15/2018   Procedure: ESOPHAGOGASTRODUODENOSCOPY (EGD);  Surgeon: Ladene Artist, MD;  Location: [facility] ENDOSCOPY;  Service: Endoscopy;  Laterality: N/A;    Social History Andrew Pacheco  reports that he has never smoked. He has never used smokeless tobacco. He reports current alcohol use. He reports that he does not use drugs.  family history includes Arthritis in his mother; Cancer in his mother; Diverticulosis in his father; Emphysema in his father; Heart disease in his father; Liver cancer in his  mother.  No Known Allergies     PHYSICAL EXAMINATION: Vital signs: BP 112/72 (BP Location: Left Arm, Patient Position: Sitting, Cuff Size: Normal)   Pulse 68   Temp 98.7 F (37.1 C)   Ht 5\' 9"  (1.753 m) Comment: height measured without shoes  Wt 170 lb (77.1 kg)   BMI 25.10 kg/m   Constitutional: generally well-appearing, no acute distress Psychiatric: alert and oriented x3, cooperative Eyes: extraocular movements intact, anicteric, conjunctiva pink Mouth: oral pharynx moist, no lesions Neck: supple no lymphadenopathy Cardiovascular: heart regular rate and rhythm, no murmur Lungs: clear to auscultation bilaterally Abdomen: soft, nontender, nondistended, no obvious ascites, no peritoneal signs, normal bowel sounds, no organomegaly Rectal: Omitted Extremities: no clubbing, cyanosis, or lower extremity edema bilaterally Skin: no lesions on visible extremities Neuro: No focal deficits.  Cranial nerves intact  ASSESSMENT:  1.  History of peptic ulcer disease.  Asymptomatic on PPI 2.  History of iron deficiency anemia secondary to erosive gastritis ulcer disease.  Resolved 3.  Colonoscopy 2015 with diverticulosis 4.  Chronic back pain on chronic narcotics 5.  Constipation due to narcotics   PLAN:  1.  Continue pantoprazole 40 mg daily indefinitely 2.  Refilled pantoprazole 40 mg daily.  Medication side effects reviewed 3.  MiraLAX as needed for constipation 4.  Resume general medical care with Dr. Philip Aspen.  GI  follow-up as needed

## 2020-02-14 NOTE — Patient Instructions (Signed)
We have sent the following medications to your pharmacy for you to pick up at your convenience:  Pantoprazole  Please follow up in one year  

## 2020-04-15 ENCOUNTER — Telehealth: Payer: Self-pay | Admitting: Physical Medicine and Rehabilitation

## 2020-04-15 NOTE — Telephone Encounter (Signed)
Patient left a message requesting repeat bilateral L4-5 and L5-S1 facet injections. Last done 11/26/2019. Ok to repeat if helped, same problem/side, and no new injury?

## 2020-04-16 NOTE — Telephone Encounter (Signed)
ok 

## 2020-04-16 NOTE — Telephone Encounter (Signed)
Scheduled for 5/18 at 1400 with driver.

## 2020-05-06 ENCOUNTER — Ambulatory Visit: Payer: Self-pay

## 2020-05-06 ENCOUNTER — Other Ambulatory Visit: Payer: Self-pay

## 2020-05-06 ENCOUNTER — Ambulatory Visit: Payer: Medicare Other | Admitting: Physical Medicine and Rehabilitation

## 2020-05-06 ENCOUNTER — Encounter: Payer: Self-pay | Admitting: Physical Medicine and Rehabilitation

## 2020-05-06 VITALS — BP 155/87 | HR 64

## 2020-05-06 DIAGNOSIS — M7061 Trochanteric bursitis, right hip: Secondary | ICD-10-CM

## 2020-05-06 DIAGNOSIS — M519 Unspecified thoracic, thoracolumbar and lumbosacral intervertebral disc disorder: Secondary | ICD-10-CM

## 2020-05-06 DIAGNOSIS — M47816 Spondylosis without myelopathy or radiculopathy, lumbar region: Secondary | ICD-10-CM | POA: Diagnosis not present

## 2020-05-06 DIAGNOSIS — F1190 Opioid use, unspecified, uncomplicated: Secondary | ICD-10-CM

## 2020-05-06 DIAGNOSIS — M545 Low back pain: Secondary | ICD-10-CM | POA: Diagnosis not present

## 2020-05-06 DIAGNOSIS — G8929 Other chronic pain: Secondary | ICD-10-CM

## 2020-05-06 MED ORDER — BUPIVACAINE HCL 0.5 % IJ SOLN
3.0000 mL | Freq: Once | INTRAMUSCULAR | Status: AC
Start: 2020-05-06 — End: 2020-05-06
  Administered 2020-05-06: 3 mL

## 2020-05-06 MED ORDER — METHYLPREDNISOLONE ACETATE 80 MG/ML IJ SUSP
40.0000 mg | Freq: Once | INTRAMUSCULAR | Status: AC
  Administered 2020-05-06: 40 mg

## 2020-05-06 NOTE — Progress Notes (Signed)
Pt states pain across the lower back and into both buttocks. Pt states pain started 2 years ago. Pt states last injection 11/26/19 helped a lot and pain returned the end of February 2021. Sitting, laying down, and walking for a long time makes pain worse. Hydrocodone and heating pad helps with pain.   .Numeric Pain Rating Scale and Functional Assessment Average Pain 6   In the last MONTH (on 0-10 scale) has pain interfered with the following?  1. General activity like being  able to carry out your everyday physical activities such as walking, climbing stairs, carrying groceries, or moving a chair?  Rating(9)   +Driver, -BT, -Dye Allergies.

## 2020-05-07 ENCOUNTER — Encounter: Payer: Self-pay | Admitting: Physical Medicine and Rehabilitation

## 2020-05-07 NOTE — Procedures (Signed)
Lumbar Diagnostic Facet Joint Nerve Block with Fluoroscopic Guidance   Patient: Andrew Pacheco      Date of Birth: 06/29/1941 [name]: [id number] PCP: [name], MD      Visit Date: 05/06/2020   Universal Protocol:    Date/Time: 05/19/215:30 AM  Consent Given By: the patient  Position: PRONE  Additional Comments: Vital signs were monitored before and after the procedure. Patient was prepped and draped in the usual sterile fashion. The correct patient, procedure, and site was verified.   Injection Procedure Details:  Procedure Site One Meds Administered:  Meds ordered this encounter  Medications  . bupivacaine (MARCAINE) 0.5 % (with pres) injection 3 mL  . methylPREDNISolone acetate (DEPO-MEDROL) injection 40 mg     Laterality: Bilateral  Location/Site:  L4-L5 L5-S1  Needle size: 22 ga.  Needle type:spinal  Needle Placement: Oblique pedical  Findings:   -Comments: There was excellent flow of contrast along the articular pillars without intravascular flow.  Procedure Details: The fluoroscope beam is vertically oriented in AP and then obliqued 15 to 20 degrees to the ipsilateral side of the desired nerve to achieve the "Scotty dog" appearance.  The skin over the target area of the junction of the superior articulating process and the transverse process (sacral ala if blocking the L5 dorsal rami) was locally anesthetized with a 1 ml volume of 1% Lidocaine without Epinephrine.  The spinal needle was inserted and advanced in a trajectory view down to the target.   After contact with periosteum and negative aspirate for blood and CSF, correct placement without intravascular or epidural spread was confirmed by injecting 0.5 ml. of Isovue-250.  A spot radiograph was obtained of this image.    Next, a 0.5 ml. volume of the injectate described above was injected. The needle was then redirected to the other facet joint nerves mentioned above if needed.  Prior to the procedure,  the patient was given a Pain Diary which was completed for baseline measurements.  After the procedure, the patient rated their pain every 30 minutes and will continue rating at this frequency for a total of 5 hours.  The patient has been asked to complete the Diary and return to [place] by mail, fax or hand delivered as soon as possible.   Additional Comments:  The patient tolerated the procedure well Dressing: 2 x 2 sterile gauze and Band-Aid    Post-procedure details: Patient was observed during the procedure. Post-procedure instructions were reviewed.  Patient left the clinic in stable condition.

## 2020-05-07 NOTE — Progress Notes (Signed)
Andrew Pacheco - 79 y.o. male [name] [id number]  Date of birth: August 28, 1941  Office Visit Note: Visit Date: 05/06/2020 PCP: [name], MD Referred by: [name], MD  Subjective: Chief Complaint  Patient presents with  . Lower Back - Pain  . Right Hip - Pain   HPI: Andrew Pacheco is a 79 y.o. male who comes in today For evaluation management of chronic axial low back pain some referral in the upper buttocks. No pain down the legs. No groin pain. His history is such that we have seen him over the years and completed facet joint blocks with good intermittent relief of his back pain. Initially intra-articular injections gave him more than 6 months of relief and we did see him about once a year. He was originally referred to [place] through Dr. Philip Aspen his primary care physician. Somewhat of an issue is the fact that he has also been followed at [facility] and has had multiple bursa injections by Dr. Laroy Apple. He has had physical therapy in continues on home exercise program pretty diligently. He tries to be active at 79 years old. Last injection performed was medial branch blocks at L4-5 L5-S1 with good diagnostic relief and he actually says he got relief for a couple of months. He has no focal weakness no history of prior lumbar surgery. Imaging shows facet arthropathy without stenosis. Exam is consistent with facet mediated back pain. He does have pain in the right greater trochanter at times but this is not his biggest complaint. He is also had a severe issue in the thoracic spine with disc extrusion which was followed by Dr. Kristeen Miss. He reports that he is on chronic opioid management which now is 7.5 mg of hydrocodone 3 times per day. This is slowly increased over time. He does not really endorse that he takes this pain medication for his lower back and does not really endorse that it helps his lower back that much. He does not complain today about thoracic back  pain.  Review of Systems  Musculoskeletal: Positive for back pain.  All other systems reviewed and are negative.  Otherwise per HPI.  Assessment & Plan: Visit Diagnoses:  1. Spondylosis without myelopathy or radiculopathy, lumbar region   2. Chronic bilateral low back pain without sciatica   3. Greater trochanteric bursitis, right   4. Thoracic disc disease   5. Opioid use, unspecified, uncomplicated     Plan: Findings:  Chronic pain syndrome and chronic thoracic and lower back pain with main complaints today of lower back pain. He is somewhat of a hard historian and he has seen multiple physicians for thoracic low back and hip pain. He does use hydrocodone for his thoracic pain this is been followed by neurosurgery, Dr. Ellene Route. His back pain seems to be facet mediated lower back pain. He has had good diagnostic injection on the last visit which was in December. This is documented. He has had other conservative care without much results. At this point going to complete second diagnostic block and if he does well with that look at approval for radiofrequency ablation as part of overall plan for his pain. He will continue with current medication management. His greater trochanteric pain is somewhat posterior and may be related to his facet joints. Could look at completing injection of the bursa at some point.    Meds & Orders:  Meds ordered this encounter  Medications  . bupivacaine (MARCAINE) 0.5 % (with pres) injection  3 mL  . methylPREDNISolone acetate (DEPO-MEDROL) injection 40 mg    Orders Placed This Encounter  Procedures  . Facet Injection  . XR C-ARM NO REPORT    Follow-up: Return if symptoms worsen or fail to improve, for Consider RFA, Review Pain Diary.   Procedures: No procedures performed  Lumbar Diagnostic Facet Joint Nerve Block with Fluoroscopic Guidance   Patient: Andrew Pacheco      Date of Birth: 01-06-1941 [name]: [id number] PCP: [name], MD      Visit Date:  05/06/2020   Universal Protocol:    Date/Time: 05/19/215:30 AM  Consent Given By: the patient  Position: PRONE  Additional Comments: Vital signs were monitored before and after the procedure. Patient was prepped and draped in the usual sterile fashion. The correct patient, procedure, and site was verified.   Injection Procedure Details:  Procedure Site One Meds Administered:  Meds ordered this encounter  Medications  . bupivacaine (MARCAINE) 0.5 % (with pres) injection 3 mL  . methylPREDNISolone acetate (DEPO-MEDROL) injection 40 mg     Laterality: Bilateral  Location/Site:  L4-L5 L5-S1  Needle size: 22 ga.  Needle type:spinal  Needle Placement: Oblique pedical  Findings:   -Comments: There was excellent flow of contrast along the articular pillars without intravascular flow.  Procedure Details: The fluoroscope beam is vertically oriented in AP and then obliqued 15 to 20 degrees to the ipsilateral side of the desired nerve to achieve the "Scotty dog" appearance.  The skin over the target area of the junction of the superior articulating process and the transverse process (sacral ala if blocking the L5 dorsal rami) was locally anesthetized with a 1 ml volume of 1% Lidocaine without Epinephrine.  The spinal needle was inserted and advanced in a trajectory view down to the target.   After contact with periosteum and negative aspirate for blood and CSF, correct placement without intravascular or epidural spread was confirmed by injecting 0.5 ml. of Isovue-250.  A spot radiograph was obtained of this image.    Next, a 0.5 ml. volume of the injectate described above was injected. The needle was then redirected to the other facet joint nerves mentioned above if needed.  Prior to the procedure, the patient was given a Pain Diary which was completed for baseline measurements.  After the procedure, the patient rated their pain every 30 minutes and will continue rating at this  frequency for a total of 5 hours.  The patient has been asked to complete the Diary and return to [place] by mail, fax or hand delivered as soon as possible.   Additional Comments:  The patient tolerated the procedure well Dressing: 2 x 2 sterile gauze and Band-Aid    Post-procedure details: Patient was observed during the procedure. Post-procedure instructions were reviewed.  Patient left the clinic in stable condition.    Clinical History: Post myelogram CT of the lumbar spine: 2019   Comparison:  MR 06/08/2006.   Findings: Mild atherosclerotic type changes of the aorta branch vessels.  Small hiatal hernia.   L1-2:  Conus is at the level.  No abnormal vessels.  Central protrusion with mild indentation upon the thecal sac.  There may be left posterior lateral cephalad  extension of disc material as there is broad-based impression on the left ventral aspect of thecal sac at the mid to lower L1 level.  Epidural process such as hematoma or tumor felt to be less likely considerations.   L2-3:  No significant spinal stenosis or  foraminal narrowing.   L3-4:  Hemangioma L3 vertebra unchanged.  Bulge with superimposed shallow broad-based right lateral protrusion with mild indentation on the right lateral aspect of the thecal sac.   L4-5:  Mild bulge.  Mild bilateral facet joint degenerative changes.  Very mild spinal stenosis.   L5-S1:  Mild to moderate bilateral facet joint degenerative changes.  Minimal anterior slip of L5 upon S1.  Bulge with mild spur.  Mild to slightly moderate bilateral foraminal narrowing.   Impression:  Degenerative changes throughout the lumbar spine as noted above.  Most significant change since the prior MR scan is that noted at the L1-2 level as described above.  Provider: Hubbard Robinson   He reports that he has never smoked. He has never used smokeless tobacco. No results for input(s): HGBA1C, LABURIC in the last 8760 hours.  Objective:  VS:  HT:     WT:   BMI:     BP:(!) 155/87  HR:64bpm  TEMP: ( )  RESP:  Physical Exam Constitutional:      General: He is not in acute distress.    Appearance: Normal appearance. He is not ill-appearing.  HENT:     Head: Normocephalic and atraumatic.     Right Ear: External ear normal.     Left Ear: External ear normal.  Eyes:     Extraocular Movements: Extraocular movements intact.  Cardiovascular:     Rate and Rhythm: Normal rate.     Pulses: Normal pulses.  Abdominal:     General: There is no distension.     Palpations: Abdomen is soft.  Musculoskeletal:        General: No tenderness or signs of injury.     Right lower leg: No edema.     Left lower leg: No edema.     Comments: Patient has good distal strength without clonus. Slow to rise from a seated position to full extension does have concordant low back pain with facet loading. He has a negative slump test bilaterally he does have some pain over the right greater trochanter but is not specific. He has no pain with hip rotation and has good range of motion bilaterally.  Skin:    Findings: No erythema or rash.  Neurological:     General: No focal deficit present.     Mental Status: He is alert and oriented to person, place, and time.     Sensory: No sensory deficit.     Motor: No weakness or abnormal muscle tone.     Coordination: Coordination normal.  Psychiatric:        Mood and Affect: Mood normal.        Behavior: Behavior normal.     Ortho Exam  Imaging: XR C-ARM NO REPORT  Result Date: 05/06/2020 Please see Notes tab for imaging impression.   Past Medical/Family/Surgical/Social History: Medications & Allergies reviewed per EMR, new medications updated. Patient Active Problem List   Diagnosis Date Noted  . Vasodepressor syncope 09/11/2017  . Vasovagal near syncope 09/11/2017  . Low vitamin B12 level 09/10/2017  . HTN (hypertension) 09/10/2017  . Malnutrition of moderate degree 09/10/2017  . Gastroesophageal  reflux disease   . AKI (acute kidney injury) ([place])   . Glaucoma   . Near syncope 09/08/2017  . 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  . ANEMIA-UNSPECIFIED 09/26/2009  . CONSTIPATION 09/26/2009  . NAUSEA 09/26/2009  . ABDOMINAL PAIN -GENERALIZED 09/26/2009   Past Medical History:  Diagnosis Date  . Anemia   . Arthritis    "  both hips, back" (09/09/2017)  . Chronic back pain    "middle and lower back" (09/09/2017)  . Colon polyps   . Daily headache   . Diverticulosis   . GERD (gastroesophageal reflux disease)   . Glaucoma, both eyes   . H pylori ulcer   . Hard of hearing   . HTN (hypertension)   . 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 Lumbar disc disease   . Migraine    "q 3-4 months" (09/09/2017)  . Seasonal allergies    Family History  Problem Relation Age of Onset  . Heart disease Father   . Emphysema Father   . Diverticulosis Father   . Cancer Mother        gallbladder-mets  . Arthritis Mother   . Liver cancer Mother    Past Surgical History:  Procedure Laterality Date  . APPENDECTOMY    . CYST EXCISION Right ~ 1971   knee  . ESOPHAGOGASTRODUODENOSCOPY N/A 01/15/2018   Procedure: ESOPHAGOGASTRODUODENOSCOPY (EGD);  Surgeon: Ladene Artist, MD;  [facility] END[facility]COPY;  Service: Endoscopy;  Laterality: N/A;   Social History   Occupational History  . Occupation: security guard - [employer]  Tobacco Use  . Smoking status: Never Smoker  . Smokeless tobacco: Never Used  Substance and Sexual Activity  . Alcohol use: Yes    Comment: 09/09/2017 "couple glasses of wine q 6 months"  . Drug use: No  . Sexual activity: Not on file

## 2020-05-22 ENCOUNTER — Telehealth: Payer: Self-pay | Admitting: *Deleted

## 2020-05-22 NOTE — Telephone Encounter (Signed)
Notification or Prior Authorization is not required for the requested services [phone] and [phone]  Decision ID [id number]  The number above acknowledges your inquiry and our response. Please write this number down and refer to it for future inquiries. Coverage and payment for an item or service is governed by the member's benefit plan document, and, if applicable, the provider's participation agreement with the Health Plan.

## 2020-05-23 ENCOUNTER — Telehealth: Payer: Self-pay | Admitting: *Deleted

## 2020-05-23 NOTE — Telephone Encounter (Signed)
Notification or Prior Authorization is not required for the requested services [phone] and [phone]  Decision ID #:[id number]  The number above acknowledges your inquiry and our response. Please write this number down and refer to it for future inquiries. Coverage and payment for an item or service is governed by the member's benefit plan document, and, if applicable, the provider's participation agreement with the Health Plan.

## 2020-07-14 ENCOUNTER — Other Ambulatory Visit: Payer: Self-pay

## 2020-07-14 ENCOUNTER — Encounter: Payer: Self-pay | Admitting: Physical Medicine and Rehabilitation

## 2020-07-14 ENCOUNTER — Ambulatory Visit: Payer: Self-pay

## 2020-07-14 ENCOUNTER — Ambulatory Visit: Payer: Medicare Other | Admitting: Physical Medicine and Rehabilitation

## 2020-07-14 VITALS — BP 167/97 | HR 84

## 2020-07-14 DIAGNOSIS — M47816 Spondylosis without myelopathy or radiculopathy, lumbar region: Secondary | ICD-10-CM | POA: Diagnosis not present

## 2020-07-14 MED ORDER — METHYLPREDNISOLONE ACETATE 80 MG/ML IJ SUSP
40.0000 mg | Freq: Once | INTRAMUSCULAR | Status: AC
  Administered 2020-07-14: 40 mg

## 2020-07-14 NOTE — Progress Notes (Signed)
Pt states lower back pain on bot sides. Pt states the pain stay there and laying helps a little with the pain. Pt state Meds helps with the pain. Pt has hx of inj on 05/06/20 pt states it was great, just didn't last long.   Numeric Pain Rating Scale and Functional Assessment Average Pain 5   In the last MONTH (on 0-10 scale) has pain interfered with the following?  1. General activity like being  able to carry out your everyday physical activities such as walking, climbing stairs, carrying groceries, or moving a chair?  Rating(7)   +Driver, -BT, -Dye Allergies.

## 2020-07-22 ENCOUNTER — Encounter: Payer: Self-pay | Admitting: Physical Medicine and Rehabilitation

## 2020-07-22 ENCOUNTER — Ambulatory Visit: Payer: Medicare Other | Admitting: Physical Medicine and Rehabilitation

## 2020-07-22 ENCOUNTER — Ambulatory Visit: Payer: Self-pay

## 2020-07-22 VITALS — BP 151/85 | HR 100

## 2020-07-22 DIAGNOSIS — M47816 Spondylosis without myelopathy or radiculopathy, lumbar region: Secondary | ICD-10-CM | POA: Diagnosis not present

## 2020-07-22 MED ORDER — METHYLPREDNISOLONE ACETATE 80 MG/ML IJ SUSP
80.0000 mg | Freq: Once | INTRAMUSCULAR | Status: AC
Start: 2020-07-22 — End: 2020-07-22
  Administered 2020-07-22: 80 mg

## 2020-07-22 NOTE — Progress Notes (Signed)
Andrew Pacheco - 79 y.o. male [name] [id number]  Date of birth: [birth year]  Office Visit Note: Visit Date: 07/14/2020 PCP: [name], MD Referred by: [name], MD  Subjective: Chief Complaint  Patient presents with  . Lower Back - Pain   HPI:  Andrew Pacheco is a 79 y.o. male who comes in today for planned radiofrequency ablation of the Left L4-L5 and L5-S1 Lumbar facet joints. This would be ablation of the corresponding medial branches and/or dorsal rami.  Patient has had double diagnostic blocks with more than 50% relief.  These are documented on pain diary.  They have had chronic back pain for quite some time, more than 3 months, which has been an ongoing situation with recalcitrant axial back pain.  They have no radicular pain.  Their axial pain is worse with standing and ambulating and on exam today with facet loading.  They have had physical therapy as well as home exercise program.  The imaging noted in the chart below indicated facet pathology. Accordingly they meet all the criteria and qualification for for radiofrequency ablation and we are going to complete this today hopefully for more longer term relief as part of comprehensive management program.  ROS Otherwise per HPI.  Assessment & Plan: Visit Diagnoses:  1. Spondylosis without myelopathy or radiculopathy, lumbar region     Plan: No additional findings.   Meds & Orders:  Meds ordered this encounter  Medications  . methylPREDNISolone acetate (DEPO-MEDROL) injection 40 mg    Orders Placed This Encounter  Procedures  . Radiofrequency,Lumbar  . XR C-ARM NO REPORT    Follow-up: Return if symptoms worsen or fail to improve.   Procedures: No procedures performed  Lumbar Facet Joint Nerve Denervation  Patient: Andrew Pacheco      Date of Birth: 1941/03/18 [name]: [id number] PCP: [name], MD      Visit Date: 07/14/2020   Universal Protocol:    Date/Time: 07/23/2111:56 PM  Consent Given By: the  patient  Position: PRONE  Additional Comments: Vital signs were monitored before and after the procedure. Patient was prepped and draped in the usual sterile fashion. The correct patient, procedure, and site was verified.   Injection Procedure Details:  Procedure Site One Meds Administered:  Meds ordered this encounter  Medications  . methylPREDNISolone acetate (DEPO-MEDROL) injection 40 mg     Laterality: Left  Location/Site:  L4-L5 L5-S1  Needle size: 18 G  Needle type: Radiofrequency cannula  Needle Placement: Along juncture of superior articular process and transverse pocess  Findings:  -Comments:  Procedure Details: For each desired target nerve, the corresponding transverse process (sacral ala for the L5 dorsal rami) was identified and the fluoroscope was positioned to square off the endplates of the corresponding vertebral body to achieve a true AP midline view.  The beam was then obliqued 15 to 20 degrees and caudally tilted 15 to 20 degrees to line up a trajectory along the target nerves. The skin over the target of the junction of superior articulating process and transverse process (sacral ala for the L5 dorsal rami) was infiltrated with 22ml of 1% Lidocaine without Epinephrine.  The 18 gauge 32mm active tip outer cannula was advanced in trajectory view to the target.  This procedure was repeated for each target nerve.  Then, for all levels, the outer cannula placement was fine-tuned and the position was then confirmed with bi-planar imaging.    Test stimulation was done both at sensory and motor levels to  ensure there was no radicular stimulation. The target tissues were then infiltrated with 1 ml of 1% Lidocaine without Epinephrine. Subsequently, a percutaneous neurotomy was carried out for 90 seconds at 80 degrees Celsius.  After the completion of the lesion, 1 ml of injectate was delivered. It was then repeated for each facet joint nerve mentioned above.  Appropriate radiographs were obtained to verify the probe placement during the neurotomy.   Additional Comments:  The patient tolerated the procedure well Dressing: 2 x 2 sterile gauze and Band-Aid    Post-procedure details: Patient was observed during the procedure. Post-procedure instructions were reviewed.  Patient left the clinic in stable condition.       Clinical History: Post myelogram CT of the lumbar spine: 2019   Comparison:  MR 06/08/2006.   Findings: Mild atherosclerotic type changes of the aorta branch vessels.  Small hiatal hernia.   L1-2:  Conus is at the level.  No abnormal vessels.  Central protrusion with mild indentation upon the thecal sac.  There may be left posterior lateral cephalad  extension of disc material as there is broad-based impression on the left ventral aspect of thecal sac at the mid to lower L1 level.  Epidural process such as hematoma or tumor felt to be less likely considerations.   L2-3:  No significant spinal stenosis or foraminal narrowing.   L3-4:  Hemangioma L3 vertebra unchanged.  Bulge with superimposed shallow broad-based right lateral protrusion with mild indentation on the right lateral aspect of the thecal sac.   L4-5:  Mild bulge.  Mild bilateral facet joint degenerative changes.  Very mild spinal stenosis.   L5-S1:  Mild to moderate bilateral facet joint degenerative changes.  Minimal anterior slip of L5 upon S1.  Bulge with mild spur.  Mild to slightly moderate bilateral foraminal narrowing.   Impression:  Degenerative changes throughout the lumbar spine as noted above.  Most significant change since the prior MR scan is that noted at the L1-2 level as described above.  Provider: Hubbard Robinson     Objective:  VS:  HT:    WT:   BMI:     BP:(!) 167/97  HR:84bpm  TEMP: ( )  RESP:  Physical Exam Constitutional:      General: He is not in acute distress.    Appearance: Normal appearance. He is not  ill-appearing.  HENT:     Head: Normocephalic and atraumatic.     Right Ear: External ear normal.     Left Ear: External ear normal.  Eyes:     Extraocular Movements: Extraocular movements intact.  Cardiovascular:     Rate and Rhythm: Normal rate.     Pulses: Normal pulses.  Abdominal:     General: There is no distension.     Palpations: Abdomen is soft.  Musculoskeletal:        General: No tenderness or signs of injury.     Right lower leg: No edema.     Left lower leg: No edema.     Comments: Patient has good distal strength without clonus. Patient somewhat slow to rise from a seated position to full extension.  There is concordant low back pain with facet loading and lumbar spine extension rotation.  There are no definitive trigger points but the patient is somewhat tender across the lower back and PSIS.  There is no pain with hip rotation.  Skin:    Findings: No erythema or rash.  Neurological:     General: No focal  deficit present.     Mental Status: He is alert and oriented to person, place, and time.     Sensory: No sensory deficit.     Motor: No weakness or abnormal muscle tone.     Coordination: Coordination normal.  Psychiatric:        Mood and Affect: Mood normal.        Behavior: Behavior normal.      Imaging: No results found.

## 2020-07-22 NOTE — Progress Notes (Signed)
Pt states right lower back. Pt states laying and sitting and sometime walking makes the pain worse.. Pt states stretching and a little moving helps with the pain.  Pt has hx of inj on 07/14/20 & 05/06/20 pt states they did great for a month.  Numeric Pain Rating Scale and Functional Assessment Average Pain 5   In the last MONTH (on 0-10 scale) has pain interfered with the following?  1. General activity like being  able to carry out your everyday physical activities such as walking, climbing stairs, carrying groceries, or moving a chair?  Rating(9)   +Driver, -BT, -Dye Allergies.

## 2020-07-22 NOTE — Procedures (Signed)
Lumbar Facet Joint Nerve Denervation  Patient: Andrew Pacheco      Date of Birth: 09-06-1941 [name]: [id number] PCP: [name], MD      Visit Date: 07/14/2020   Universal Protocol:    Date/Time: 07/23/2111:56 PM  Consent Given By: the patient  Position: PRONE  Additional Comments: Vital signs were monitored before and after the procedure. Patient was prepped and draped in the usual sterile fashion. The correct patient, procedure, and site was verified.   Injection Procedure Details:  Procedure Site One Meds Administered:  Meds ordered this encounter  Medications  . methylPREDNISolone acetate (DEPO-MEDROL) injection 40 mg     Laterality: Left  Location/Site:  L4-L5 L5-S1  Needle size: 18 G  Needle type: Radiofrequency cannula  Needle Placement: Along juncture of superior articular process and transverse pocess  Findings:  -Comments:  Procedure Details: For each desired target nerve, the corresponding transverse process (sacral ala for the L5 dorsal rami) was identified and the fluoroscope was positioned to square off the endplates of the corresponding vertebral body to achieve a true AP midline view.  The beam was then obliqued 15 to 20 degrees and caudally tilted 15 to 20 degrees to line up a trajectory along the target nerves. The skin over the target of the junction of superior articulating process and transverse process (sacral ala for the L5 dorsal rami) was infiltrated with 33ml of 1% Lidocaine without Epinephrine.  The 18 gauge 17mm active tip outer cannula was advanced in trajectory view to the target.  This procedure was repeated for each target nerve.  Then, for all levels, the outer cannula placement was fine-tuned and the position was then confirmed with bi-planar imaging.    Test stimulation was done both at sensory and motor levels to ensure there was no radicular stimulation. The target tissues were then infiltrated with 1 ml of 1% Lidocaine without  Epinephrine. Subsequently, a percutaneous neurotomy was carried out for 90 seconds at 80 degrees Celsius.  After the completion of the lesion, 1 ml of injectate was delivered. It was then repeated for each facet joint nerve mentioned above. Appropriate radiographs were obtained to verify the probe placement during the neurotomy.   Additional Comments:  The patient tolerated the procedure well Dressing: 2 x 2 sterile gauze and Band-Aid    Post-procedure details: Patient was observed during the procedure. Post-procedure instructions were reviewed.  Patient left the clinic in stable condition.

## 2020-07-23 NOTE — Procedures (Signed)
Lumbar Facet Joint Nerve Denervation  Patient: Andrew Pacheco      Date of Birth: [birth year] [name]: [id number] PCP: [name], MD      Visit Date: 07/22/2020   Universal Protocol:    Date/Time: 08/04/218:00 AM  Consent Given By: the patient  Position: PRONE  Additional Comments: Vital signs were monitored before and after the procedure. Patient was prepped and draped in the usual sterile fashion. The correct patient, procedure, and site was verified.   Injection Procedure Details:  Procedure Site One Meds Administered:  Meds ordered this encounter  Medications  . methylPREDNISolone acetate (DEPO-MEDROL) injection 80 mg     Laterality: Right  Location/Site:  L4-L5 L5-S1  Needle size: 18 G  Needle type: Radiofrequency cannula  Needle Placement: Along juncture of superior articular process and transverse pocess  Findings:  -Comments:  Procedure Details: For each desired target nerve, the corresponding transverse process (sacral ala for the L5 dorsal rami) was identified and the fluoroscope was positioned to square off the endplates of the corresponding vertebral body to achieve a true AP midline view.  The beam was then obliqued 15 to 20 degrees and caudally tilted 15 to 20 degrees to line up a trajectory along the target nerves. The skin over the target of the junction of superior articulating process and transverse process (sacral ala for the L5 dorsal rami) was infiltrated with 85ml of 1% Lidocaine without Epinephrine.  The 18 gauge 77mm active tip outer cannula was advanced in trajectory view to the target.  This procedure was repeated for each target nerve.  Then, for all levels, the outer cannula placement was fine-tuned and the position was then confirmed with bi-planar imaging.    Test stimulation was done both at sensory and motor levels to ensure there was no radicular stimulation. The target tissues were then infiltrated with 1 ml of 1% Lidocaine without  Epinephrine. Subsequently, a percutaneous neurotomy was carried out for 90 seconds at 80 degrees Celsius.  After the completion of the lesion, 1 ml of injectate was delivered. It was then repeated for each facet joint nerve mentioned above. Appropriate radiographs were obtained to verify the probe placement during the neurotomy.   Additional Comments:  The patient tolerated the procedure well Dressing: 2 x 2 sterile gauze and Band-Aid    Post-procedure details: Patient was observed during the procedure. Post-procedure instructions were reviewed.  Patient left the clinic in stable condition.

## 2020-07-23 NOTE — Progress Notes (Signed)
Andrew Pacheco - 79 y.o. male [name] [id number]  Date of birth: 29-Mar-1941  Office Visit Note: Visit Date: 07/22/2020 PCP: [name], MD Referred by: [name], MD  Subjective: Chief Complaint  Patient presents with  . Lower Back - Pain   HPI:  Andrew Pacheco is a 79 y.o. male who comes in today for planned radiofrequency ablation of the Right L4-L5 L5-S1 Lumbar facet joints. This would be ablation of the corresponding medial branches and/or dorsal rami.  Patient has had double diagnostic blocks with more than 50% relief.  These are documented on pain diary.  They have had chronic back pain for quite some time, more than 3 months, which has been an ongoing situation with recalcitrant axial back pain.  They have no radicular pain.  Their axial pain is worse with standing and ambulating and on exam today with facet loading.  They have had physical therapy as well as home exercise program.  The imaging noted in the chart below indicated facet pathology. Accordingly they meet all the criteria and qualification for for radiofrequency ablation and we are going to complete this today hopefully for more longer term relief as part of comprehensive management program.  ROS Otherwise per HPI.  Assessment & Plan: Visit Diagnoses:  1. Spondylosis without myelopathy or radiculopathy, lumbar region     Plan: No additional findings.   Meds & Orders:  Meds ordered this encounter  Medications  . methylPREDNISolone acetate (DEPO-MEDROL) injection 80 mg    Orders Placed This Encounter  Procedures  . Radiofrequency,Lumbar  . XR C-ARM NO REPORT    Follow-up: Return in about 4 weeks (around 08/19/2020).   Procedures: No procedures performed  Lumbar Facet Joint Nerve Denervation  Patient: Andrew Pacheco      Date of Birth: December 06, 1941 [name]: [id number] PCP: [name], MD      Visit Date: 07/22/2020   Universal Protocol:    Date/Time: 08/04/218:00 AM  Consent Given By: the  patient  Position: PRONE  Additional Comments: Vital signs were monitored before and after the procedure. Patient was prepped and draped in the usual sterile fashion. The correct patient, procedure, and site was verified.   Injection Procedure Details:  Procedure Site One Meds Administered:  Meds ordered this encounter  Medications  . methylPREDNISolone acetate (DEPO-MEDROL) injection 80 mg     Laterality: Right  Location/Site:  L4-L5 L5-S1  Needle size: 18 G  Needle type: Radiofrequency cannula  Needle Placement: Along juncture of superior articular process and transverse pocess  Findings:  -Comments:  Procedure Details: For each desired target nerve, the corresponding transverse process (sacral ala for the L5 dorsal rami) was identified and the fluoroscope was positioned to square off the endplates of the corresponding vertebral body to achieve a true AP midline view.  The beam was then obliqued 15 to 20 degrees and caudally tilted 15 to 20 degrees to line up a trajectory along the target nerves. The skin over the target of the junction of superior articulating process and transverse process (sacral ala for the L5 dorsal rami) was infiltrated with 85ml of 1% Lidocaine without Epinephrine.  The 18 gauge 69mm active tip outer cannula was advanced in trajectory view to the target.  This procedure was repeated for each target nerve.  Then, for all levels, the outer cannula placement was fine-tuned and the position was then confirmed with bi-planar imaging.    Test stimulation was done both at sensory and motor levels to ensure there  was no radicular stimulation. The target tissues were then infiltrated with 1 ml of 1% Lidocaine without Epinephrine. Subsequently, a percutaneous neurotomy was carried out for 90 seconds at 80 degrees Celsius.  After the completion of the lesion, 1 ml of injectate was delivered. It was then repeated for each facet joint nerve mentioned above.  Appropriate radiographs were obtained to verify the probe placement during the neurotomy.   Additional Comments:  The patient tolerated the procedure well Dressing: 2 x 2 sterile gauze and Band-Aid    Post-procedure details: Patient was observed during the procedure. Post-procedure instructions were reviewed.  Patient left the clinic in stable condition.       Clinical History: Post myelogram CT of the lumbar spine: 2019   Comparison:  MR 06/08/2006.   Findings: Mild atherosclerotic type changes of the aorta branch vessels.  Small hiatal hernia.   L1-2:  Conus is at the level.  No abnormal vessels.  Central protrusion with mild indentation upon the thecal sac.  There may be left posterior lateral cephalad  extension of disc material as there is broad-based impression on the left ventral aspect of thecal sac at the mid to lower L1 level.  Epidural process such as hematoma or tumor felt to be less likely considerations.   L2-3:  No significant spinal stenosis or foraminal narrowing.   L3-4:  Hemangioma L3 vertebra unchanged.  Bulge with superimposed shallow broad-based right lateral protrusion with mild indentation on the right lateral aspect of the thecal sac.   L4-5:  Mild bulge.  Mild bilateral facet joint degenerative changes.  Very mild spinal stenosis.   L5-S1:  Mild to moderate bilateral facet joint degenerative changes.  Minimal anterior slip of L5 upon S1.  Bulge with mild spur.  Mild to slightly moderate bilateral foraminal narrowing.   Impression:  Degenerative changes throughout the lumbar spine as noted above.  Most significant change since the prior MR scan is that noted at the L1-2 level as described above.  Provider: Hubbard Robinson     Objective:  VS:  HT:    WT:   BMI:     BP:(!) 151/85  HR:100bpm  TEMP: ( )  RESP:  Physical Exam Constitutional:      General: He is not in acute distress.    Appearance: Normal appearance. He is not  ill-appearing.  HENT:     Head: Normocephalic and atraumatic.     Right Ear: External ear normal.     Left Ear: External ear normal.  Eyes:     Extraocular Movements: Extraocular movements intact.  Cardiovascular:     Rate and Rhythm: Normal rate.     Pulses: Normal pulses.  Abdominal:     General: There is no distension.     Palpations: Abdomen is soft.  Musculoskeletal:        General: No tenderness or signs of injury.     Right lower leg: No edema.     Left lower leg: No edema.     Comments: Patient has good distal strength without clonus.Patient somewhat slow to rise from a seated position to full extension.  There is concordant low back pain with facet loading and lumbar spine extension rotation.  There are no definitive trigger points but the patient is somewhat tender across the lower back and PSIS.  There is no pain with hip rotation.  Skin:    Findings: No erythema or rash.  Neurological:     General: No focal deficit present.  Mental Status: He is alert and oriented to person, place, and time.     Sensory: No sensory deficit.     Motor: No weakness or abnormal muscle tone.     Coordination: Coordination normal.  Psychiatric:        Mood and Affect: Mood normal.        Behavior: Behavior normal.      Imaging: XR C-ARM NO REPORT  Result Date: 07/22/2020 Please see Notes tab for imaging impression.

## 2020-08-15 ENCOUNTER — Telehealth: Payer: Self-pay

## 2020-08-15 NOTE — Telephone Encounter (Signed)
Patient called in wanting cancel upcoming appt  to re sch and make an appt for a week out .

## 2020-08-18 ENCOUNTER — Telehealth: Payer: Self-pay | Admitting: Physical Medicine and Rehabilitation

## 2020-08-18 NOTE — Telephone Encounter (Signed)
Busy signal

## 2020-08-18 NOTE — Telephone Encounter (Signed)
Patient called needing to cancel appt. Please call patient to confirm cancellation. Patient phone number is [phone].

## 2020-08-18 NOTE — Telephone Encounter (Signed)
Rescheduled

## 2020-08-19 ENCOUNTER — Ambulatory Visit: Payer: Medicare Other | Admitting: Physical Medicine and Rehabilitation

## 2020-09-03 ENCOUNTER — Ambulatory Visit: Payer: Medicare Other | Admitting: Physical Medicine and Rehabilitation

## 2020-09-03 ENCOUNTER — Encounter: Payer: Self-pay | Admitting: Physical Medicine and Rehabilitation

## 2020-09-03 ENCOUNTER — Other Ambulatory Visit: Payer: Self-pay

## 2020-09-03 VITALS — BP 131/77 | HR 61

## 2020-09-03 DIAGNOSIS — M47816 Spondylosis without myelopathy or radiculopathy, lumbar region: Secondary | ICD-10-CM

## 2020-09-03 DIAGNOSIS — G8929 Other chronic pain: Secondary | ICD-10-CM | POA: Diagnosis not present

## 2020-09-03 DIAGNOSIS — M259 Joint disorder, unspecified: Secondary | ICD-10-CM | POA: Diagnosis not present

## 2020-09-03 DIAGNOSIS — M545 Low back pain: Secondary | ICD-10-CM | POA: Diagnosis not present

## 2020-09-03 MED ORDER — PREDNISONE 50 MG PO TABS: ORAL_TABLET | ORAL | 0 refills | Status: DC

## 2020-09-03 NOTE — Progress Notes (Signed)
Andrew Pacheco - 79 y.o. male [name] [id number]  Date of birth: 1941-11-19  Office Visit Note: Visit Date: 09/03/2020 PCP: [name], MD Referred by: [name], MD  Subjective: Chief Complaint  Patient presents with  . Lower Back - Pain   HPI: Andrew Pacheco is a 79 y.o. male who comes in today For follow-up evaluation management of chronic recalcitrant mainly low back pain.  He also reports however multiple joint pain in general.  Even as an older individual is fairly active.  We have been seeing the patient off and on for many years for mainly his back pain.  He carries a diagnosis of chronic daily headache and migraine.  He has not had any orthopedic type surgeries or joint replacements etc.  He reports really pain all over in terms of his joints and just recently flaring up over time.  It does limit his activities of daily living.  It is a daily occurrence.  He reports 95% relief from the radiofrequency ablation of his lower back however.  That was completed 4 weeks ago so I think he is going to do well with that and that should last hopefully close to a year.  He has no radicular complaints or paresthesias.  He does report in the mornings taking all quite a long time to get ready and to get going.  He reports over an hour of just general stiffness and pain in the mornings.  He has had no new diseases or upper respiratory infections.  He has had all immunizations including coronal vaccine.  He denies any focal weakness but does feel weak in general.  He reports no changes in his appetite or eating.  He is followed pretty closely by his primary physician Dr. Janie Morning.  It appears he has never seen a rheumatologist or had rheumatologic work-up although I do not have all of the laboratory findings from Dr. Sharlett Iles recently.  This is because they are on a different system than Epic  Review of Systems  Musculoskeletal: Positive for back pain, joint pain and neck pain.  Neurological:  Positive for headaches. Negative for tingling, tremors and focal weakness.  All other systems reviewed and are negative.  Otherwise per HPI.  Assessment & Plan: Visit Diagnoses:  1. Chronic bilateral low back pain without sciatica   2. Spondylosis without myelopathy or radiculopathy, lumbar region   3. Multiple joint complaints     Plan: Findings:  1.  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  We will continue to monitor this over time and he can repeat that if he gets relief that lasts longer than 6 months which I anticipate.  We had a long discussion about back exercises and strengthening today.  2.  All over body and joint myalgias and joint pain in the setting of really no history of joint surgeries and some worsening of stiffness particularly in the morning.  I did obtain labs today to do a basic rheumatologic screen.  If there is any abnormalities I would refer him to a rheumatologist or back to Dr. Valetta Fuller to look at further work-up for that.  If they are negative we could look at potential for medication trial such as something like a Cymbalta or other adjunct of treatment.  Patient requested prednisone that he has had in the past for a few days.  We did go ahead and provide that just for a few  days.  If that gives him a lot of relief and that may be something that could be done very intermittently.  He would need to follow-up with his primary care physician.  We will get the blood draw first for concerns of things like polymyalgia rheumatica or other rheumatologic condition.    Meds & Orders:  Meds ordered this encounter  Medications  . predniSONE (DELTASONE) 50 MG tablet    Sig: Take 1 tablet daily with food for 5 days until finished    Dispense:  5 tablet    Refill:  0    Orders Placed This Encounter  Procedures  . Sed Rate (ESR)  . C-reactive protein  . Rheumatoid Factor    Follow-up: Return  if symptoms worsen or fail to improve.   Procedures: No procedures performed  No notes on file   Clinical History: Post myelogram CT of the lumbar spine: 2019   Comparison:  MR 06/08/2006.   Findings: Mild atherosclerotic type changes of the aorta branch vessels.  Small hiatal hernia.   L1-2:  Conus is at the level.  No abnormal vessels.  Central protrusion with mild indentation upon the thecal sac.  There may be left posterior lateral cephalad  extension of disc material as there is broad-based impression on the left ventral aspect of thecal sac at the mid to lower L1 level.  Epidural process such as hematoma or tumor felt to be less likely considerations.   L2-3:  No significant spinal stenosis or foraminal narrowing.   L3-4:  Hemangioma L3 vertebra unchanged.  Bulge with superimposed shallow broad-based right lateral protrusion with mild indentation on the right lateral aspect of the thecal sac.   L4-5:  Mild bulge.  Mild bilateral facet joint degenerative changes.  Very mild spinal stenosis.   L5-S1:  Mild to moderate bilateral facet joint degenerative changes.  Minimal anterior slip of L5 upon S1.  Bulge with mild spur.  Mild to slightly moderate bilateral foraminal narrowing.   Impression:  Degenerative changes throughout the lumbar spine as noted above.  Most significant change since the prior MR scan is that noted at the L1-2 level as described above.  Provider: Kelly Revels   He reports that he has never smoked. He has never used smokeless tobacco. No results for input(s): HGBA1C, LABURIC in the last 8760 hours.  Objective:  VS:  HT:    WT:   BMI:     BP:131/77  HR:61bpm  TEMP: ( )  RESP:  Physical Exam Constitutional:      General: He is not in acute distress.    Appearance: Normal appearance. He is not ill-appearing.  HENT:     Head: Normocephalic and atraumatic.     Right Ear: External ear normal.     Left Ear: External ear normal.  Eyes:      Extraocular Movements: Extraocular movements intact.  Cardiovascular:     Rate and Rhythm: Normal rate.     Pulses: Normal pulses.  Abdominal:     General: There is no distension.     Palpations: Abdomen is soft.  Musculoskeletal:        General: No tenderness, deformity or signs of injury.     Right lower leg: No edema.     Left lower leg: No edema.     Comments: Patient has good distal strength without clonus.  He has better tolerant of facet loading on exam today.  He has no focal trigger points but he is somewhat   tender across multiple areas.  No frank tender points but similar.  He has good movement of most of his joints has some mild impingement of the shoulders bilaterally.  He has a negative drop arm test bilaterally of the shoulders.  He has good strength in the hands.  He has arthritic changes of the hands.  He has good movement of the knees with good varus and valgus stability no joint line tenderness or swelling.  I do not note any swelling in any of the joints or synovitis.  Skin:    Findings: Bruising present. No erythema, lesion or rash.  Neurological:     General: No focal deficit present.     Mental Status: He is alert and oriented to person, place, and time.     Sensory: No sensory deficit.     Motor: No weakness or abnormal muscle tone.     Coordination: Coordination normal.     Gait: Gait normal.  Psychiatric:        Mood and Affect: Mood normal.        Behavior: Behavior normal.     Ortho Exam  Imaging: No results found.  Past Medical/Family/Surgical/Social History: Medications & Allergies reviewed per EMR, new medications updated. Patient Active Problem List   Diagnosis Date Noted  . Vasodepressor syncope 09/11/2017  . Vasovagal near syncope 09/11/2017  . Low vitamin B12 level 09/10/2017  . HTN (hypertension) 09/10/2017  . Malnutrition of moderate degree 09/10/2017  . Gastroesophageal reflux disease   . AKI (acute kidney injury) ([place])   . Glaucoma   .  Near syncope 09/08/2017  . 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  . ANEMIA-UNSPECIFIED 09/26/2009  . CONSTIPATION 09/26/2009  . NAUSEA 09/26/2009  . ABDOMINAL PAIN -GENERALIZED 09/26/2009   Past Medical History:  Diagnosis Date  . Anemia   . Arthritis    "both hips, back" (09/09/2017)  . Chronic back pain    "middle and lower back" (09/09/2017)  . Colon polyps   . Daily headache   . Diverticulosis   . GERD (gastroesophageal reflux disease)   . Glaucoma, both eyes   . H pylori ulcer   . Hard of hearing   . HTN (hypertension)   . 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 Lumbar disc disease   . Migraine    "q 3-4 months" (09/09/2017)  . Seasonal allergies    Family History  Problem Relation Age of Onset  . Heart disease Father   . Emphysema Father   . Diverticulosis Father   . Cancer Mother        gallbladder-mets  . Arthritis Mother   . Liver cancer Mother    Past Surgical History:  Procedure Laterality Date  . APPENDECTOMY    . CYST EXCISION Right ~ 1971   knee  . ESOPHAGOGASTRODUODENOSCOPY N/A 01/15/2018   Procedure: ESOPHAGOGASTRODUODENOSCOPY (EGD);  Surgeon: Ladene Artist, MD;  Location: [facility] ENDOSCOPY;  Service: Endoscopy;  Laterality: N/A;   Social History   Occupational History  . Occupation: security guard - [employer]  Tobacco Use  . Smoking status: Never Smoker  . Smokeless tobacco: Never Used  Vaping Use  . Vaping Use: Never used  Substance and Sexual Activity  . Alcohol use: Yes    Comment: 09/09/2017 "couple glasses of wine q 6 months"  . Drug use: No  . Sexual activity: Not on file

## 2020-09-03 NOTE — Progress Notes (Signed)
Doing very well since RFA. Reports 95% or better relief of low back pain.  Also wants to discuss pain in all his joints. Requesting a prescription for prednisone. He states that he has taken this in the past and it helped.  Numeric Pain Rating Scale and Functional Assessment Average Pain 1   In the last MONTH (on 0-10 scale) has pain interfered with the following?  1. General activity like being  able to carry out your everyday physical activities such as walking, climbing stairs, carrying groceries, or moving a chair?  Rating(0)

## 2020-09-04 LAB — RHEUMATOID FACTOR: Rheumatoid fact SerPl-aCnc: <14 IU/mL (ref <14)

## 2020-09-04 LAB — C-REACTIVE PROTEIN: CRP: 3.8 mg/L (ref <8.0)

## 2020-09-04 LAB — SEDIMENTATION RATE: Sed Rate: 22 mm/h — ABNORMAL HIGH (ref 0–20)

## 2020-10-16 ENCOUNTER — Telehealth: Payer: Self-pay | Admitting: Physical Medicine and Rehabilitation

## 2020-10-16 DIAGNOSIS — M4306 Spondylolysis, lumbar region: Secondary | ICD-10-CM

## 2020-10-16 DIAGNOSIS — M47816 Spondylosis without myelopathy or radiculopathy, lumbar region: Secondary | ICD-10-CM

## 2020-10-16 NOTE — Telephone Encounter (Signed)
Patient called needing a call back to schedule an appointment with Dr Ernestina Patches for his back. The number to contact patient is [phone]

## 2020-10-20 NOTE — Telephone Encounter (Signed)
If able he needs to get MRI lspine ( had myelogram in 2019) also needs course of PT

## 2020-10-20 NOTE — Telephone Encounter (Signed)
Please Advise

## 2020-10-20 NOTE — Telephone Encounter (Signed)
Patient states that he has no pacemaker and that he can have MRI's. MRI ordered.

## 2020-10-20 NOTE — Telephone Encounter (Signed)
Patient reported doing well at follow up from RFA. Please advise.

## 2020-10-22 ENCOUNTER — Telehealth: Payer: Self-pay | Admitting: Physical Medicine and Rehabilitation

## 2020-10-22 NOTE — Telephone Encounter (Signed)
Scheduled for OV for MRI review.

## 2020-11-10 ENCOUNTER — Ambulatory Visit
Admission: RE | Admit: 2020-11-10 | Discharge: 2020-11-10 | Disposition: A | Discharge status: Home / Self Care | Payer: Medicare Other | Source: Ambulatory Visit | Attending: Physical Medicine and Rehabilitation | Admitting: Physical Medicine and Rehabilitation

## 2020-11-10 ENCOUNTER — Other Ambulatory Visit: Payer: Self-pay

## 2020-11-10 DIAGNOSIS — M47816 Spondylosis without myelopathy or radiculopathy, lumbar region: Secondary | ICD-10-CM

## 2020-11-11 ENCOUNTER — Ambulatory Visit (INDEPENDENT_AMBULATORY_CARE_PROVIDER_SITE_OTHER): Payer: Medicare Other | Admitting: Physical Medicine and Rehabilitation

## 2020-11-11 ENCOUNTER — Encounter: Payer: Self-pay | Admitting: Physical Medicine and Rehabilitation

## 2020-11-11 VITALS — BP 179/89 | HR 82

## 2020-11-11 DIAGNOSIS — M47816 Spondylosis without myelopathy or radiculopathy, lumbar region: Secondary | ICD-10-CM

## 2020-11-11 DIAGNOSIS — M519 Unspecified thoracic, thoracolumbar and lumbosacral intervertebral disc disorder: Secondary | ICD-10-CM | POA: Diagnosis not present

## 2020-11-11 DIAGNOSIS — G8929 Other chronic pain: Secondary | ICD-10-CM

## 2020-11-11 DIAGNOSIS — M48061 Spinal stenosis, lumbar region without neurogenic claudication: Secondary | ICD-10-CM | POA: Diagnosis not present

## 2020-11-11 DIAGNOSIS — M545 Low back pain, unspecified: Secondary | ICD-10-CM | POA: Diagnosis not present

## 2020-11-11 DIAGNOSIS — M7061 Trochanteric bursitis, right hip: Secondary | ICD-10-CM

## 2020-11-11 DIAGNOSIS — M7062 Trochanteric bursitis, left hip: Secondary | ICD-10-CM

## 2020-11-11 NOTE — Progress Notes (Signed)
Here for MRI review. Pain on right side when going from sit to stand. Left sided pain when walking. Pain radiates into both legs with numbness and tingling. Numeric Pain Rating Scale and Functional Assessment Average Pain 8   In the last MONTH (on 0-10 scale) has pain interfered with the following?  1. General activity like being  able to carry out your everyday physical activities such as walking, climbing stairs, carrying groceries, or moving a chair?  Rating(10)

## 2020-12-21 ENCOUNTER — Other Ambulatory Visit: Payer: Self-pay | Admitting: Internal Medicine

## 2020-12-21 DIAGNOSIS — K259 Gastric ulcer, unspecified as acute or chronic, without hemorrhage or perforation: Secondary | ICD-10-CM

## 2021-06-08 ENCOUNTER — Encounter: Payer: Self-pay | Admitting: Physical Medicine and Rehabilitation

## 2021-06-08 NOTE — Progress Notes (Signed)
Andrew Pacheco - 80 y.o. male [name] [id number]  Date of birth: 01/12/1941  Office Visit Note: Visit Date: 11/11/2020 PCP: [name], MD Referred by: [name], MD  Subjective: Chief Complaint  Patient presents with   Lower Back - Pain   HPI: Andrew Pacheco is a 80 y.o. male who comes in today For follow-up evaluation management of chronic worsening severe mainly thoracic pain and continued low back pain.  He reports radiating pain in the legs with numbness and tingling.  I did complete radiofrequency ablation of the lower lumbar spine back in August.  This was ablation of the L4-5 and L5-S1 facet joints.  He did get some relief with that but overall continues to have complaints of significant upper back pain and lower back pain and pain with going from sit to stand and with walking.  He rates his average pain is 8 out of 10 and it does really affect his daily activities.  He tries to stay active with home exercise program.  He is also followed pretty closely with his primary doctor, Dr. Janie Morning.  He does take some hydrocodone.  He feels like the hydrocodone helps for a period of time.  He has not had specific physical therapy recently but has had that in the past.  He endorses no specific focal weakness no recent falls etc.  No red flag complaints.  Because of his ongoing complaints we did repeat MRI of the lumbar spine and I reviewed that with him today.  He has multilevel facet arthropathy no nerve compression no central stenosis.  He does have areas that could irritate the L5 nerve roots bilaterally.  He has had multiple epidural injections in the past.  Review of Systems  Musculoskeletal:  Positive for back pain and joint pain.  Neurological:  Positive for tingling.  All other systems reviewed and are negative. Otherwise per HPI.  Assessment & Plan: Visit Diagnoses:    ICD-10-CM   1. Chronic bilateral low back pain without sciatica  M54.50 Ambulatory referral to Physical  Therapy   G89.29     2. Spondylosis without myelopathy or radiculopathy, lumbar region  M47.816 Ambulatory referral to Physical Therapy    3. Bilateral stenosis of lateral recess of lumbar spine  M48.061 Ambulatory referral to Physical Therapy    4. Thoracic disc disease  M51.9 Ambulatory referral to Physical Therapy    5. Greater trochanteric bursitis, left  M70.62 Ambulatory referral to Physical Therapy    6. Greater trochanteric bursitis, right  M70.61 Ambulatory referral to Physical Therapy       Plan: Findings:  1.  Chronic worsening severe mid to lower back pain worse with going from sit to stand and standing consistent with facet arthropathy.  Had good relief with radiofrequency ablation of the lower joints and this may be some of the upper joints as he has arthritis throughout.  Would consider diagnostic medial branch blocks of those upper lumbar facet joints.  For right now given all of his current situation I am going to recommend physical therapy for him and we did make a referral for that.  This is to get him stronger and to try dry needling.  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  Would consider trial of duloxetine.  2.  Upper back and thoracic pain clearly seems to be more myofascial but he does have spondylitic  change on x-rays taken in the past.  Again will refer to physical therapy for this.   Meds & Orders: No orders of the defined types were placed in this encounter.   Orders Placed This Encounter  Procedures   Ambulatory referral to Physical Therapy    Follow-up: No follow-ups on file.   Procedures: No procedures performed      Clinical History: MRI LUMBAR SPINE WITHOUT CONTRAST   TECHNIQUE: Multiplanar, multisequence MR imaging of the lumbar spine was performed. No intravenous contrast was administered.   COMPARISON:  08/27/2008 CT  L-spine. 07/15/2018 MRI lumbar spine and prior.   FINDINGS: Segmentation:  Standard.   Alignment: Straightening of lordosis. Minimal grade 1 L5-S1 anterolisthesis.   Vertebrae:  Vertebral body heights are preserved.  L3 hemangioma.   Conus medullaris and cauda equina: Conus extends to the L1 level. Conus and cauda equina appear normal.   Disc levels: Multilevel desiccation and disc space loss.   L1-2: Minimal disc bulge with superimposed superiorly migrated left paracentral extrusion, unchanged. Patent spinal canal and neural foramen.   L2-3: No significant disc bulge. Bilateral facet degenerative spurring. Patent spinal canal and neural foramen.   L3-4: Mild disc bulge, prominent ligamentum flavum and bilateral facet degenerative spurring. Mild spinal canal and bilateral neural foraminal narrowing.   L4-5: Mild disc bulge, ligamentum flavum and bilateral facet hypertrophy. Patent spinal canal. Mild bilateral neural foraminal narrowing.   L5-S1: Disc bulge grazing the exiting L5 nerve root. Bilateral facet hypertrophy. Patent spinal canal. Mild bilateral neural foraminal narrowing.   Paraspinal and other soft tissues: Negative.   IMPRESSION: Multilevel spondylosis, grossly unchanged.   Superiorly migrated left L1-2 paracentral extrusion is similar to prior exam. Patent spinal canal and neural foramen at this level.   Mild L3-4 spinal canal and mild L3-S1 bilateral neural foraminal narrowing.     Electronically Signed   By: Primitivo Gauze M.D.   On: 11/10/2020 13:46   He reports that he has never smoked. He has never used smokeless tobacco. No results for input(s): HGBA1C, LABURIC in the last 8760 hours.  Objective:  VS:  HT:    WT:   BMI:     BP:(!) 179/89  HR:82bpm  TEMP: ( )  RESP:  Physical Exam Vitals and nursing note reviewed.  Constitutional:      General: He is not in acute distress.    Appearance: Normal appearance. He is normal weight. He  is not ill-appearing.  HENT:     Head: Normocephalic and atraumatic.     Right Ear: External ear normal.     Left Ear: External ear normal.     Nose: No congestion.  Eyes:     Extraocular Movements: Extraocular movements intact.  Cardiovascular:     Rate and Rhythm: Normal rate.     Pulses: Normal pulses.  Pulmonary:     Effort: Pulmonary effort is normal. No respiratory distress.  Abdominal:     General: There is no distension.     Palpations: Abdomen is soft.  Musculoskeletal:        General: No tenderness or signs of injury.     Cervical back: Neck supple.     Right lower leg: No edema.     Left lower leg: No edema.     Comments: Patient has good distal strength without clonus.  He has some pain to the quadratus lumborum with focal trigger points in the paraspinal and upper buttock region.  He does have pain with extension  and facet loading that seems to be more upper lumbar.  He has no pain with hip rotation.  He has good sensation throughout.  He has a negative slump test.  Examining the thoracic spine shows increased kyphosis without any step-off and no real focal pain along the vertebral bodies.  Again taut bands.  Skin:    Findings: No erythema or rash.  Neurological:     General: No focal deficit present.     Mental Status: He is alert and oriented to person, place, and time.     Sensory: No sensory deficit.     Motor: No weakness or abnormal muscle tone.     Coordination: Coordination normal.  Psychiatric:        Mood and Affect: Mood normal.        Behavior: Behavior normal.    Ortho Exam  Imaging: No results found.  Past Medical/Family/Surgical/Social History: Medications & Allergies reviewed per EMR, new medications updated. Patient Active Problem List   Diagnosis Date Noted   Vasodepressor syncope 09/11/2017   Vasovagal near syncope 09/11/2017   Low vitamin B12 level 09/10/2017   HTN (hypertension) 09/10/2017   Malnutrition of moderate degree 09/10/2017    Gastroesophageal reflux disease    AKI (acute kidney injury) ([place])    Glaucoma    Near syncope 09/08/2017   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   ANEMIA-UNSPECIFIED 09/26/2009   CONSTIPATION 09/26/2009   NAUSEA 09/26/2009   ABDOMINAL PAIN -GENERALIZED 09/26/2009   Past Medical History:  Diagnosis Date   Anemia    Arthritis    "both hips, back" (09/09/2017)   Chronic back pain    "middle and lower back" (09/09/2017)   Colon polyps    Daily headache    Diverticulosis    GERD (gastroesophageal reflux disease)    Glaucoma, both eyes    H pylori ulcer    Hard of hearing    HTN (hypertension)    Insomnia    due to disc   Lumbar disc disease    Migraine    "q 3-4 months" (09/09/2017)   Seasonal allergies    Family History  Problem Relation Age of Onset   Heart disease Father    Emphysema Father    Diverticulosis Father    Cancer Mother        gallbladder-mets   Arthritis Mother    Liver cancer Mother    Past Surgical History:  Procedure Laterality Date   APPENDECTOMY     CYST EXCISION Right ~ 1971   knee   ESOPHAGOGASTRODUODENOSCOPY N/A 01/15/2018   Procedure: ESOPHAGOGASTRODUODENOSCOPY (EGD);  Surgeon: Ladene Artist, MD;  Location: [facility] ENDOSCOPY;  Service: Endoscopy;  Laterality: N/A;   Social History   Occupational History   Occupation: security guard - [employer]  Tobacco Use   Smoking status: Never   Smokeless tobacco: Never  Vaping Use   Vaping Use: Never used  Substance and Sexual Activity   Alcohol use: Yes    Comment: 09/09/2017 "couple glasses of wine q 6 months"   Drug use: No   Sexual activity: Not on file

## 2021-07-08 ENCOUNTER — Inpatient Hospital Stay (HOSPITAL_COMMUNITY)
Admission: EM | Admit: 2021-07-08 | Discharge: 2021-07-13 | DRG: 522 | Disposition: A | Discharge status: Skilled Nursing Facility | Payer: Medicare Other | Attending: Internal Medicine | Admitting: Internal Medicine

## 2021-07-08 ENCOUNTER — Other Ambulatory Visit: Payer: Self-pay

## 2021-07-08 ENCOUNTER — Emergency Department (HOSPITAL_COMMUNITY): Payer: Medicare Other

## 2021-07-08 ENCOUNTER — Encounter (HOSPITAL_COMMUNITY): Payer: Self-pay | Admitting: Emergency Medicine

## 2021-07-08 DIAGNOSIS — S72001A Fracture of unspecified part of neck of right femur, initial encounter for closed fracture: Secondary | ICD-10-CM

## 2021-07-08 DIAGNOSIS — Z79899 Other long term (current) drug therapy: Secondary | ICD-10-CM

## 2021-07-08 DIAGNOSIS — Z7982 Long term (current) use of aspirin: Secondary | ICD-10-CM

## 2021-07-08 DIAGNOSIS — E785 Hyperlipidemia, unspecified: Secondary | ICD-10-CM | POA: Diagnosis present

## 2021-07-08 DIAGNOSIS — D649 Anemia, unspecified: Secondary | ICD-10-CM | POA: Diagnosis not present

## 2021-07-08 DIAGNOSIS — I10 Essential (primary) hypertension: Secondary | ICD-10-CM | POA: Diagnosis present

## 2021-07-08 DIAGNOSIS — K5903 Drug induced constipation: Secondary | ICD-10-CM | POA: Diagnosis not present

## 2021-07-08 DIAGNOSIS — D62 Acute posthemorrhagic anemia: Secondary | ICD-10-CM | POA: Diagnosis not present

## 2021-07-08 DIAGNOSIS — Z8261 Family history of arthritis: Secondary | ICD-10-CM | POA: Diagnosis not present

## 2021-07-08 DIAGNOSIS — Z79891 Long term (current) use of opiate analgesic: Secondary | ICD-10-CM | POA: Diagnosis not present

## 2021-07-08 DIAGNOSIS — Y92009 Unspecified place in unspecified non-institutional (private) residence as the place of occurrence of the external cause: Secondary | ICD-10-CM

## 2021-07-08 DIAGNOSIS — W19XXXA Unspecified fall, initial encounter: Secondary | ICD-10-CM

## 2021-07-08 DIAGNOSIS — K219 Gastro-esophageal reflux disease without esophagitis: Secondary | ICD-10-CM | POA: Diagnosis present

## 2021-07-08 DIAGNOSIS — Z20822 Contact with and (suspected) exposure to covid-19: Secondary | ICD-10-CM | POA: Diagnosis present

## 2021-07-08 DIAGNOSIS — S72011A Unspecified intracapsular fracture of right femur, initial encounter for closed fracture: Principal | ICD-10-CM | POA: Diagnosis present

## 2021-07-08 DIAGNOSIS — I951 Orthostatic hypotension: Secondary | ICD-10-CM | POA: Diagnosis not present

## 2021-07-08 DIAGNOSIS — H409 Unspecified glaucoma: Secondary | ICD-10-CM | POA: Diagnosis present

## 2021-07-08 DIAGNOSIS — W010XXA Fall on same level from slipping, tripping and stumbling without subsequent striking against object, initial encounter: Secondary | ICD-10-CM | POA: Diagnosis present

## 2021-07-08 DIAGNOSIS — Z8249 Family history of ischemic heart disease and other diseases of the circulatory system: Secondary | ICD-10-CM | POA: Diagnosis not present

## 2021-07-08 DIAGNOSIS — M160 Bilateral primary osteoarthritis of hip: Secondary | ICD-10-CM | POA: Diagnosis present

## 2021-07-08 DIAGNOSIS — S72009A Fracture of unspecified part of neck of unspecified femur, initial encounter for closed fracture: Secondary | ICD-10-CM | POA: Diagnosis present

## 2021-07-08 DIAGNOSIS — Z419 Encounter for procedure for purposes other than remedying health state, unspecified: Secondary | ICD-10-CM

## 2021-07-08 DIAGNOSIS — E860 Dehydration: Secondary | ICD-10-CM | POA: Diagnosis not present

## 2021-07-08 DIAGNOSIS — M549 Dorsalgia, unspecified: Secondary | ICD-10-CM | POA: Diagnosis present

## 2021-07-08 DIAGNOSIS — H9190 Unspecified hearing loss, unspecified ear: Secondary | ICD-10-CM | POA: Diagnosis present

## 2021-07-08 DIAGNOSIS — G8929 Other chronic pain: Secondary | ICD-10-CM | POA: Diagnosis present

## 2021-07-08 DIAGNOSIS — K5730 Diverticulosis of large intestine without perforation or abscess without bleeding: Secondary | ICD-10-CM | POA: Diagnosis present

## 2021-07-08 DIAGNOSIS — M47819 Spondylosis without myelopathy or radiculopathy, site unspecified: Secondary | ICD-10-CM | POA: Diagnosis present

## 2021-07-08 LAB — BASIC METABOLIC PANEL
Anion gap: 11 (ref 5–15)
BUN: 15 mg/dL (ref 8–23)
CO2: 23 mmol/L (ref 22–32)
Calcium: 9.3 mg/dL (ref 8.9–10.3)
Chloride: 100 mmol/L (ref 98–111)
Creatinine, Ser: 1.21 mg/dL (ref 0.61–1.24)
GFR, Estimated: 60 mL/min — ABNORMAL LOW (ref >60)
Glucose, Bld: 98 mg/dL (ref 70–99)
Potassium: 3.6 mmol/L (ref 3.5–5.1)
Sodium: 134 mmol/L — ABNORMAL LOW (ref 135–145)

## 2021-07-08 LAB — RESP PANEL BY RT-PCR (FLU A&B, COVID) ARPGX2
Influenza A by PCR: NEGATIVE
Influenza B by PCR: NEGATIVE
SARS Coronavirus 2 by RT PCR: NEGATIVE

## 2021-07-08 LAB — CBC WITH DIFFERENTIAL/PLATELET
Abs Immature Granulocytes: 0.07 10*3/uL (ref 0.00–0.07)
Basophils Absolute: 0 10*3/uL (ref 0.0–0.1)
Basophils Relative: 0 %
Eosinophils Absolute: 0 10*3/uL (ref 0.0–0.5)
Eosinophils Relative: 0 %
HCT: 37.1 % — ABNORMAL LOW (ref 39.0–52.0)
Hemoglobin: 11.9 g/dL — ABNORMAL LOW (ref 13.0–17.0)
Immature Granulocytes: 1 %
Lymphocytes Relative: 21 %
Lymphs Abs: 1.3 10*3/uL (ref 0.7–4.0)
MCH: 29.7 pg (ref 26.0–34.0)
MCHC: 32.1 g/dL (ref 30.0–36.0)
MCV: 92.5 fL (ref 80.0–100.0)
Monocytes Absolute: 0.9 10*3/uL (ref 0.1–1.0)
Monocytes Relative: 14 %
Neutro Abs: 4.2 10*3/uL (ref 1.7–7.7)
Neutrophils Relative %: 64 %
Platelets: 175 10*3/uL (ref 150–400)
RBC: 4.01 MIL/uL — ABNORMAL LOW (ref 4.22–5.81)
RDW: 13.4 % (ref 11.5–15.5)
WBC: 6.5 10*3/uL (ref 4.0–10.5)
nRBC: 0 % (ref 0.0–0.2)

## 2021-07-08 LAB — TYPE AND SCREEN
ABO/RH(D): A POS
Antibody Screen: NEGATIVE

## 2021-07-08 LAB — PROTIME-INR
INR: 1 (ref 0.8–1.2)
Prothrombin Time: 13.4 seconds (ref 11.4–15.2)

## 2021-07-08 MED ORDER — HYDROMORPHONE HCL 1 MG/ML IJ SOLN
0.5000 mg | INTRAMUSCULAR | Status: AC | PRN
  Administered 2021-07-08 – 2021-07-09 (×2): 0.5 mg via INTRAVENOUS
  Filled 2021-07-08 (×2): qty 1

## 2021-07-08 MED ORDER — ONDANSETRON HCL 4 MG/2ML IJ SOLN
4.0000 mg | Freq: Once | INTRAMUSCULAR | Status: AC
  Administered 2021-07-08: 4 mg via INTRAVENOUS
  Filled 2021-07-08: qty 2

## 2021-07-08 NOTE — ED Provider Notes (Signed)
[facility] EMERGENCY DEPARTMENT Provider Note   CSN: [id number] Arrival date & time: 07/08/21  1902     History Chief Complaint  Patient presents with   Hip Pain    Andrew Pacheco is a 80 y.o. male presenting for evaluation of hip pain.  Patient states prior to arrival patient stepped on his own foot, causing him to fall and land on his right side.  He had acute onset severe right hip pain.  He has been able to move his leg or ambulate since.  He does not know if he hit his head, denies significant headache.  He denies neck or back pain.  No shoulder, elbow, hand, chest pain.  No pain on the left side.  He has not taken anything since the fall.  He is not on blood thinners.  No numbness or tingling.  No loss of bowel bladder control.  He does not have an orthopedic doctor.  Pain is constant, nothing makes it better.  It does not radiate.  Additional history attained from chart review.  Patient with a history of anemia, arthritis, chronic back pain on narcotics, GERD, hypertension.  HPI     Past Medical History:  Diagnosis Date   Anemia    Arthritis    "both hips, back" (09/09/2017)   Chronic back pain    "middle and lower back" (09/09/2017)   Colon polyps    Daily headache    Diverticulosis    GERD (gastroesophageal reflux disease)    Glaucoma, both eyes    H pylori ulcer    Hard of hearing    HTN (hypertension)    Insomnia    due to disc   Lumbar disc disease    Migraine    "q 3-4 months" (09/09/2017)   Seasonal allergies     Patient Active Problem List   Diagnosis Date Noted   Vasodepressor syncope 09/11/2017   Vasovagal near syncope 09/11/2017   Low vitamin B12 level 09/10/2017   HTN (hypertension) 09/10/2017   Malnutrition of moderate degree 09/10/2017   Gastroesophageal reflux disease    AKI (acute kidney injury) [place])    Glaucoma    Near syncope 09/08/2017   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   ANEMIA-UNSPECIFIED 09/26/2009   CONSTIPATION  09/26/2009   NAUSEA 09/26/2009   ABDOMINAL PAIN -GENERALIZED 09/26/2009    Past Surgical History:  Procedure Laterality Date   APPENDECTOMY     CYST EXCISION Right ~ 1971   knee   ESOPHAGOGASTRODUODENOSCOPY N/A 01/15/2018   Procedure: ESOPHAGOGASTRODUODENOSCOPY (EGD);  Surgeon: Ladene Artist, MD;  Location: [facility] ENDOSCOPY;  Service: Endoscopy;  Laterality: N/A;       Family History  Problem Relation Age of Onset   Heart disease Father    Emphysema Father    Diverticulosis Father    Cancer Mother        gallbladder-mets   Arthritis Mother    Liver cancer Mother     Social History   Tobacco Use   Smoking status: Never   Smokeless tobacco: Never  Vaping Use   Vaping Use: Never used  Substance Use Topics   Alcohol use: Yes    Comment: 09/09/2017 "couple glasses of wine q 6 months"   Drug use: No    Home Medications Prior to Admission medications   Medication Sig Start Date End Date Taking? Authorizing Provider  aspirin EC 81 MG EC tablet Take 1 tablet (81 mg total) by mouth daily. 09/12/17   Eugenie Filler,  MD  atorvastatin (LIPITOR) 40 MG tablet Take 1 tablet (40 mg total) by mouth daily at 6 PM. 09/11/17   Eugenie Filler, MD  COMBIGAN 0.2-0.5 % ophthalmic solution Place 1 drop into both eyes 2 (two) times daily. 01/10/18   [provider]  cyclobenzaprine (FLEXERIL) 10 MG tablet Take 10 mg by mouth as needed. 12/11/19   [provider]  feeding supplement, ENSURE ENLIVE, (ENSURE ENLIVE) LIQD Take 237 mLs by mouth 2 (two) times daily between meals. Patient taking differently: Take 237 mLs by mouth daily.  09/12/17   Eugenie Filler, MD  HYDROcodone-acetaminophen (NORCO) 7.5-325 MG tablet Take 1 tablet by mouth 3 (three) times daily as needed. 04/15/20   [provider]  ipratropium (ATROVENT) 0.06 % nasal spray Place 2 sprays into both nostrils daily.     [provider]  metoprolol succinate (TOPROL-XL) 25 MG 24 hr tablet Take  12.5 mg by mouth daily. 12/21/19   [provider]  pantoprazole (PROTONIX) 40 MG tablet TAKE 1 TABLET BY MOUTH EVERY DAY 12/22/20   [name], MD  predniSONE (DELTASONE) 50 MG tablet Take 1 tablet daily with food for 5 days until finished 09/03/20   Magnus Sinning, MD  Prenatal Vit-Fe Fumarate-FA (PRENATAL VITAMINS) 28-0.8 MG TABS Take 1 tablet by mouth daily. 01/26/18   [provider]  senna (SENOKOT) 8.6 MG TABS tablet Take 3 tablets by mouth every 21 ( twenty-one) days.     [provider]  travoprost, benzalkonium, (TRAVATAN) 0.004 % ophthalmic solution Place 1 drop into both eyes at bedtime.    [provider]  vitamin B-12 (CYANOCOBALAMIN) 1000 MCG tablet Take 1 tablet (1,000 mcg total) by mouth daily. 09/11/17   Eugenie Filler, MD  zolpidem (AMBIEN) 10 MG tablet Take 10 mg by mouth at bedtime.     [provider]    Allergies    Patient has no known allergies.  Review of Systems   Review of Systems  Musculoskeletal:  Positive for arthralgias.  All other systems reviewed and are negative.  Physical Exam Updated Vital Signs BP (!) 160/100 (BP Location: Right Arm)   Pulse 100   Temp 98 F (36.7 C)   Resp 18   SpO2 96%   Physical Exam Vitals and nursing note reviewed.  Constitutional:      General: He is not in acute distress.    Appearance: Normal appearance.     Comments: Appears nontoxic  HENT:     Head: Normocephalic and atraumatic.     Comments: No external signs of head trauma, however mild tenderness palpation at the base of the skull bilaterally Eyes:     Conjunctiva/sclera: Conjunctivae normal.     Pupils: Pupils are equal, round, and reactive to light.  Cardiovascular:     Rate and Rhythm: Normal rate and regular rhythm.     Pulses: Normal pulses.  Pulmonary:     Effort: Pulmonary effort is normal. No respiratory distress.     Breath sounds: Normal breath sounds. No wheezing.     Comments: Speaking in full  sentences.  Clear lung sounds in all fields. Chest:     Chest wall: No tenderness.  Abdominal:     General: There is no distension.     Palpations: Abdomen is soft. There is no mass.     Tenderness: There is no abdominal tenderness. There is no guarding or rebound.  Musculoskeletal:        General: Tenderness  present.     Cervical back: Normal range of motion and neck supple.     Comments: Tenderness palpation of the right hip and right side pelvis.  Right leg is shortened and externally rotated.  Pedal pulses 2+ bilaterally.  No tense palpation of the lower leg.  No chest palpation of the shoulder, elbow, forearm, wrist.  No tenderness palpation of midline C-spine.  Skin:    General: Skin is warm and dry.     Capillary Refill: Capillary refill takes less than 2 seconds.  Neurological:     Mental Status: He is alert and oriented to person, place, and time.  Psychiatric:        Mood and Affect: Mood and affect normal.        Speech: Speech normal.        Behavior: Behavior normal.    ED Results / Procedures / Treatments   Labs (all labs ordered are listed, but only abnormal results are displayed) Labs Reviewed  RESP PANEL BY RT-PCR (FLU A&B, COVID) [id number]  BASIC METABOLIC PANEL  CBC WITH DIFFERENTIAL/PLATELET  PROTIME-INR  TYPE AND SCREEN    EKG None  Radiology DG Hip Unilat  With Pelvis 2-3 Views Right  Result Date: 07/08/2021 CLINICAL DATA:  Right hip injury, right hip pain EXAM: DG HIP (WITH OR WITHOUT PELVIS) 2-3V RIGHT COMPARISON:  None. FINDINGS: Single view radiograph pelvis and two view radiograph right hip demonstrate an acute, impacted, subcapital right femoral neck fracture. The femoral head is still seated within the right acetabulum. The right hip joint space is preserved. Limited image of the left hip is unremarkable. The pelvis is intact. IMPRESSION: Acute, impacted right subcapital femoral neck fracture. Electronically Signed   By: Fidela Salisbury MD   On:  07/08/2021 19:39    Procedures Procedures   Medications Ordered in ED Medications  HYDROmorphone (DILAUDID) injection 0.5 mg (has no administration in time range)  ondansetron (ZOFRAN) injection 4 mg (has no administration in time range)    ED Course  I have reviewed the triage vital signs and the nursing notes.  Pertinent labs & imaging results that were available during my care of the patient were reviewed by me and considered in my medical decision making (see chart for details).    MDM Rules/Calculators/A&P                           Patient presented for evaluation of right hip pain after fall.  On exam, patient is neurovascularly intact.  He is tenderness palpation of the right hip.  Leg is shortened and externally rotated, concern for hip fracture.  Patient does not know if he hit his head, however he does have some mild tenderness to palpation of on exam.  Considering his age, will obtain CT head and neck.  Also obtain x-ray of the chest for preop clearance and due to a fall.  X-ray of the knee ordered due to patient reporting pain in this area, although there is no obvious deformity or discomfort on exam.  Labs obtained for preop clearance.  Hip x-ray viewed and independently interpreted by me, shows a fracture.  Will consult with orthopedics.  Disucssed with Dr. Stann Mainland from orthopedics.  Request CT of the hip as well as x-ray of the knee. Requests medicine admit to [facility] with plan for surgery tomorrow.   Labs interpreted by me, overall reassuring.  Will call for admission.  Discussed with Dr.  Thomas from [facility] hospitalist service, patient to be admitted.  Final Clinical Impression(s) / ED Diagnoses Final diagnoses:  Closed fracture of right hip, initial encounter [facility])  Fall, initial encounter    Rx / DC Orders ED Discharge Orders     None        Franchot Heidelberg, PA-C 07/08/21 [place], [place], DO 07/08/21 2235

## 2021-07-08 NOTE — H&P (Signed)
History and Physical    Andrew Pacheco BSW:[id number] DOB: December 31, 1940 DOA: 07/08/2021  PCP: [name], MD  Patient coming from: home  I have personally briefly reviewed patient's old medical records in [place]  Chief Complaint: fall, hip pain  HPI: Andrew Pacheco is a 80 y.o. male with medical history significant of essential hypertension, anemia, GERD, migraine, who is BIB EMS s/p mechanical fall with right hip pain. In field patient was treated with 150 mcg of fentanyl. Per patient he mis stepped and tripped over his own feet leading fall where he landed on his right side. S/p fall he had severe pain right hip area. He was unable to stand or move his leg and due to this ems was called. He denies any other injury. He also denies, sob, chest pain , no vomiting/d/abd pain/fever/chills /dysuria. He does not mild HA and nausea.   ED Course:  Vitals:afeb, bp 160/100, hr 100, rr 18, sat 96%  Xray hip : Acute, impacted right subcapital femoral neck fracture.  cx:NAD Trauma series other than noted above unremarkable  Labs: Hgb 11.9 (improved from baseline of 8) NA 134 /down from 140,c r 1.21 up from 0.95 Ekg: snr rated 105, RBBB,LPFB Orthopedics was consulted Dr  Stann Mainland who recommended transfer to [facility] under hospitalist service for surgery in am.  Plan for Left THA Dr Lyla Glassing is available for case in am at [place] [facility]: As per HPI otherwise 10 point review of systems negative.   Past Medical History:  Diagnosis Date   Anemia    Arthritis    "both hips, back" (09/09/2017)   Chronic back pain    "middle and lower back" (09/09/2017)   Colon polyps    Daily headache    Diverticulosis    GERD (gastroesophageal reflux disease)    Glaucoma, both eyes    H pylori ulcer    Hard of hearing    HTN (hypertension)    Insomnia    due to disc   Lumbar disc disease    Migraine    "q 3-4 months" (09/09/2017)   Seasonal allergies     Past Surgical History:  Procedure  Laterality Date   APPENDECTOMY     CYST EXCISION Right ~ 1971   knee   ESOPHAGOGASTRODUODENOSCOPY N/A 01/15/2018   Procedure: ESOPHAGOGASTRODUODENOSCOPY (EGD);  Surgeon: Ladene Artist, MD;  Location: [facility] ENDOSCOPY;  Service: Endoscopy;  Laterality: N/A;     reports that he has never smoked. He has never used smokeless tobacco. He reports current alcohol use. He reports that he does not use drugs.  No Known Allergies  Family History  Problem Relation Age of Onset   Heart disease Father    Emphysema Father    Diverticulosis Father    Cancer Mother        gallbladder-mets   Arthritis Mother    Liver cancer Mother     Prior to Admission medications   Medication Sig Start Date End Date Taking? Authorizing Provider  COMBIGAN 0.2-0.5 % ophthalmic solution Place 1 drop into both eyes 2 (two) times daily. 01/10/18  Yes [provider]  cyclobenzaprine (FLEXERIL) 10 MG tablet Take 10 mg by mouth as needed for muscle spasms. 12/11/19  Yes [provider]  HYDROcodone-acetaminophen (NORCO) 7.5-325 MG tablet Take 1 tablet by mouth 3 (three) times daily as needed for moderate pain. 04/15/20  Yes [provider]  ipratropium (ATROVENT) 0.06 % nasal spray Place 2 sprays into both nostrils  as needed for rhinitis.   Yes [provider]  metoprolol succinate (TOPROL-XL) 25 MG 24 hr tablet Take 12.5 mg by mouth daily. 12/21/19  Yes [provider]  pantoprazole (PROTONIX) 40 MG tablet TAKE 1 TABLET BY MOUTH EVERY DAY Patient taking differently: Take 40 mg by mouth daily. 12/22/20  Yes [name], MD  Prenatal Vit-Fe Fumarate-FA (PRENATAL VITAMINS) 28-0.8 MG TABS Take 1 tablet by mouth daily. 01/26/18  Yes [provider]  senna (SENOKOT) 8.6 MG TABS tablet Take 3 tablets by mouth every 21 ( twenty-one) days.    Yes [provider]  travoprost, benzalkonium, (TRAVATAN) 0.004 % ophthalmic solution Place 1 drop into both eyes at bedtime.   Yes  [provider]  zolpidem (AMBIEN) 10 MG tablet Take 10 mg by mouth at bedtime.   Yes [provider]  aspirin EC 81 MG EC tablet Take 1 tablet (81 mg total) by mouth daily. Patient not taking: Reported on 07/08/2021 09/12/17   Eugenie Filler, MD  atorvastatin (LIPITOR) 40 MG tablet Take 1 tablet (40 mg total) by mouth daily at 6 PM. Patient not taking: Reported on 07/08/2021 09/11/17   Eugenie Filler, MD  feeding supplement, ENSURE ENLIVE, (ENSURE ENLIVE) LIQD Take 237 mLs by mouth 2 (two) times daily between meals. Patient not taking: Reported on 07/08/2021 09/12/17   Eugenie Filler, MD  predniSONE (DELTASONE) 50 MG tablet Take 1 tablet daily with food for 5 days until finished Patient not taking: No sig reported 09/03/20   Magnus Sinning, MD  vitamin B-12 (CYANOCOBALAMIN) 1000 MCG tablet Take 1 tablet (1,000 mcg total) by mouth daily. Patient not taking: Reported on 07/08/2021 09/11/17   Eugenie Filler, MD    Physical Exam: Vitals:   07/08/21 2045 07/08/21 2100 07/08/21 2145 07/08/21 2215  BP: (!) 177/98 (!) 178/98 (!) 173/93 (!) 181/96  Pulse: 100 94 88 91  Resp: 10 13 15  (!) 9  Temp:      SpO2: 100% 97% 98% 93%     Vitals:   07/08/21 2045 07/08/21 2100 07/08/21 2145 07/08/21 2215  BP: (!) 177/98 (!) 178/98 (!) 173/93 (!) 181/96  Pulse: 100 94 88 91  Resp: 10 13 15  (!) 9  Temp:      SpO2: 100% 97% 98% 93%  Constitutional: NAD, calm, comfortable Eyes: PERRL, lids and conjunctivae normal ENMT: Mucous membranes are moist. Posterior pharynx clear of any exudate or lesions.Normal dentition.  Neck: normal, supple, no masses, no thyromegaly Respiratory: clear to auscultation bilaterally, no wheezing, no crackles. Normal respiratory effort. No accessory muscle use.  Cardiovascular: Regular rate and rhythm, no murmurs / rubs / gallops. No extremity edema. 2+ pedal pulses.  Abdomen: no tenderness, no masses palpated. No hepatosplenomegaly. Bowel sounds  positive.  Musculoskeletal: no clubbing / cyanosis. Right left externally rotated and slightly flexed,decrease rom of right leg Normal muscle tone.  Skin: no rashes, lesions, ulcers. No induration Neurologic: CN 2-12 grossly intact. Sensation intact, DTR normal. Strength 5/5 in all 4.  Psychiatric: Normal judgment and insight. Alert and oriented x 3. Normal mood.    Labs on Admission: I have personally reviewed following labs and imaging studies  CBC: Recent Labs  Lab 07/08/21 2016  WBC 6.5  NEUTROABS 4.2  HGB 11.9*  HCT 37.1*  MCV 92.5  PLT 973   Basic Metabolic Panel: Recent Labs  Lab 07/08/21 2016  NA 134*  K 3.6  CL 100  CO2 23  GLUCOSE 98  BUN 15  CREATININE 1.21  CALCIUM 9.3   GFR: CrCl cannot be calculated (Unknown ideal weight.). Liver Function Tests: No results for input(s): AST, ALT, ALKPHOS, BILITOT, PROT, ALBUMIN in the last 168 hours. No results for input(s): LIPASE, AMYLASE in the last 168 hours. No results for input(s): AMMONIA in the last 168 hours. Coagulation Profile: Recent Labs  Lab 07/08/21 2016  INR 1.0   Cardiac Enzymes: No results for input(s): CKTOTAL, CKMB, CKMBINDEX, TROPONINI in the last 168 hours. BNP (last 3 results) No results for input(s): PROBNP in the last 8760 hours. HbA1C: No results for input(s): HGBA1C in the last 72 hours. CBG: No results for input(s): GLUCAP in the last 168 hours. Lipid Profile: No results for input(s): CHOL, HDL, LDLCALC, TRIG, CHOLHDL, LDLDIRECT in the last 72 hours. Thyroid Function Tests: No results for input(s): TSH, T4TOTAL, FREET4, T3FREE, THYROIDAB in the last 72 hours. Anemia Panel: No results for input(s): VITAMINB12, FOLATE, FERRITIN, TIBC, IRON, RETICCTPCT in the last 72 hours. Urine analysis:    Component Value Date/Time   COLORURINE STRAW (A) 01/13/2018 1909   APPEARANCEUR CLEAR 01/13/2018 1909   LABSPEC 1.018 01/13/2018 1909   PHURINE 5.0 01/13/2018 1909   GLUCOSEU NEGATIVE  01/13/2018 1909   HGBUR NEGATIVE 01/13/2018 1909   BILIRUBINUR NEGATIVE 01/13/2018 1909   KETONESUR NEGATIVE 01/13/2018 1909   PROTEINUR NEGATIVE 01/13/2018 1909   NITRITE NEGATIVE 01/13/2018 1909   LEUKOCYTESUR NEGATIVE 01/13/2018 1909    Radiological Exams on Admission: DG Chest 1 View  Result Date: 07/08/2021 CLINICAL DATA:  Fall EXAM: CHEST  1 VIEW COMPARISON:  09/08/2017 FINDINGS: Lungs are clear. No pneumothorax or pleural effusion. Cardiac size is within normal limits. Thoracic aorta is mildly tortuous, accentuated by poor pulmonary insufflation. Pulmonary vascularity is normal. No acute bone abnormality. IMPRESSION: No active disease. Electronically Signed   By: Fidela Salisbury MD   On: 07/08/2021 20:18   CT HEAD WO CONTRAST  Result Date: 07/08/2021 CLINICAL DATA:  [place] EXAM: CT HEAD WITHOUT CONTRAST TECHNIQUE: Contiguous axial images were obtained from the base of the skull through the vertex without intravenous contrast. COMPARISON:  09/10/2017 FINDINGS: Brain: No acute infarct or hemorrhage. Lateral ventricles and midline structures are unremarkable. No acute extra-axial fluid collections. No mass effect. Vascular: No hyperdense vessel or unexpected calcification. Skull: Normal. Negative for fracture or focal lesion. Sinuses/Orbits: No acute finding. Other: None. IMPRESSION: 1. No acute intracranial process. Electronically Signed   By: Randa Ngo M.D.   On: 07/08/2021 21:38   CT CERVICAL SPINE WO CONTRAST  Result Date: 07/08/2021 CLINICAL DATA:  [place], neck trauma EXAM: CT CERVICAL SPINE WITHOUT CONTRAST TECHNIQUE: Multidetector CT imaging of the cervical spine was performed without intravenous contrast. Multiplanar CT image reconstructions were also generated. COMPARISON:  09/10/2017 FINDINGS: Alignment: Alignment is anatomic. Skull base and vertebrae: No acute fracture. No primary bone lesion or focal pathologic process. Soft tissues and spinal canal: No prevertebral fluid or  swelling. No visible canal hematoma. Disc levels: Mild diffuse cervical facet hypertrophy, asymmetric to the left. Disc spaces are relatively well preserved. Minimal symmetrical neural foraminal encroachment from C3-4 through C5-6 due to facet hypertrophy and uncovertebral hypertrophy. Upper chest: Airway is patent.  Lung apices are clear. Other: Reconstructed images demonstrate no additional findings. IMPRESSION: 1. No acute cervical spine fracture. 2. Multilevel spondylosis and facet hypertrophy. Electronically Signed   By: Randa Ngo M.D.   On: 07/08/2021 21:40   CT Hip Right Wo Contrast  Result Date: 07/08/2021 CLINICAL DATA:  Post fall with right hip fracture. EXAM: CT OF THE RIGHT HIP WITHOUT CONTRAST TECHNIQUE: Multidetector CT imaging of the right hip was performed according to the standard protocol. Multiplanar CT image reconstructions were also generated. COMPARISON:  Radiograph earlier today. FINDINGS: Bones/Joint/Cartilage Right femoral neck fracture is impacted as well as displaced. There is apex anterior angulation. Fracture extends to the posterior aspect of the head neck junction. Femoral head remains seated. There is mild right hip joint space narrowing. Intact pubic rami. 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 No hip joint effusion. Ligaments Suboptimally assessed by CT. Muscles and Tendons Mild fatty atrophy of gluteus medius and minimus muscle with calcification of the tendon, suggesting remote injury. No intramuscular hematoma. Soft tissues Mild soft tissue edema posterolaterally. The urinary bladder is distended. Colonic diverticulosis partially included. IMPRESSION: 1. Right femoral neck fracture is impacted as well as displaced. Fracture extends to the posterior aspect of the head neck junction. 2. Sclerotic lesion in the intertrochanteric femur has a nonaggressive appearance, possible liposclerosing  myxofibrous tumor. 3. Mild fatty atrophy of gluteus medius and minimus muscle with tendinous calcification, suggesting remote injury. Electronically Signed   By: Keith Rake M.D.   On: 07/08/2021 22:10   DG Knee Complete 4 Views Right  Result Date: 07/08/2021 CLINICAL DATA:  Patient BIB [facility]. Patient fell in [place], possible right hip fx. EXAM: RIGHT KNEE - COMPLETE 4+ VIEW COMPARISON:  None. FINDINGS: No evidence of fracture, dislocation, or joint effusion. No evidence of severe arthropathy. No aggressive appearing focal bone abnormality. Soft tissues are unremarkable. IMPRESSION: No acute displaced fracture or dislocation of the right knee. Electronically Signed   By: Iven Finn M.D.   On: 07/08/2021 20:21   DG Hip Unilat  With Pelvis 2-3 Views Right  Result Date: 07/08/2021 CLINICAL DATA:  Right hip injury, right hip pain EXAM: DG HIP (WITH OR WITHOUT PELVIS) 2-3V RIGHT COMPARISON:  None. FINDINGS: Single view radiograph pelvis and two view radiograph right hip demonstrate an acute, impacted, subcapital right femoral neck fracture. The femoral head is still seated within the right acetabulum. The right hip joint space is preserved. Limited image of the left hip is unremarkable. The pelvis is intact. IMPRESSION: Acute, impacted right subcapital femoral neck fracture. Electronically Signed   By: Fidela Salisbury MD   On: 07/08/2021 19:39    EKG: Independently reviewed. As noted above  Assessment/Plan  Acute Right femoral neck fracture is impacted as well as displaced s/p mechanical fall -admit to [facility]  under hospitalist service  - npo midnight  -hip fracture protocol -ortho consult  -current plan for THA in am  -pain control per protocol   HTN  -uncontrolled due to pain  -resume home regimen  -treat pain    GERD -ppi   HLD -continue statin   Anemia  -stable    DVT prophylaxis:  scd  Code Status: FULL  Family Communication: n/a family not at beside  Disposition Plan:  transfer to [facility]  Consults called: Ortho pedics  Dr Stann Mainland, Dr  Dr. Lyla Glassing available for case tomorrow at [facility]. Admission status: inpatient    Clance Boll MD [facility]  If 7PM-7AM, please contact night-coverage [email or web address] Password [facility]  07/08/2021, 10:48 PM

## 2021-07-08 NOTE — ED Triage Notes (Signed)
Patient BIB [facility]. Patient fell in [place], possible right hip fx. Pt received 150 mcg fentanyl via EMS.

## 2021-07-08 NOTE — ED Provider Notes (Signed)
I personally evaluated the patient during the encounter and completed a history, physical, procedures, medical decision making to contribute to the overall care of the patient and decision making for the patient briefly, the patient is a 80 y.o. male is here after mechanical fall at home.  Pain to the right hip.  X-ray confirms right hip fracture.  Not on blood thinners.  Will get CT scan of head and neck given concern for distracting injury.  Otherwise vital signs are unremarkable.  We will get medical screening labs and EKG and COVID test.  Will admit and talk with orthopedics.  Please see PA note for further results, evaluation, disposition of the patient.  Neurovascular neuromuscular intact in the right lower extremity.  Strong right dorsalis pedis pulse in the right foot. neuro exam normal otherwise.   EKG Interpretation None             Lennice Sites, DO 07/08/21 2017

## 2021-07-08 NOTE — Progress Notes (Signed)
Case d/w EDP.  Will need transfer to [facility] hospital under Hospitalist's service for surgery tomorrow on Left hip.  CT reviewed and patient will need Left THA.  Dr. Lyla Glassing available for case tomorrow at [facility].  NPO tonioght at MD.  Consult to follow tomorrow before surgery.

## 2021-07-08 NOTE — ED Notes (Signed)
To ct

## 2021-07-09 ENCOUNTER — Encounter (HOSPITAL_COMMUNITY): Payer: Self-pay | Admitting: Internal Medicine

## 2021-07-09 ENCOUNTER — Inpatient Hospital Stay (HOSPITAL_COMMUNITY): Payer: Medicare Other

## 2021-07-09 ENCOUNTER — Inpatient Hospital Stay (HOSPITAL_COMMUNITY): Payer: Medicare Other | Admitting: Anesthesiology

## 2021-07-09 ENCOUNTER — Encounter (HOSPITAL_COMMUNITY)
Admission: EM | Disposition: A | Discharge status: Skilled Nursing Facility | Payer: Self-pay | Source: Home / Self Care | Attending: Internal Medicine

## 2021-07-09 DIAGNOSIS — I10 Essential (primary) hypertension: Secondary | ICD-10-CM

## 2021-07-09 DIAGNOSIS — S72001A Fracture of unspecified part of neck of right femur, initial encounter for closed fracture: Secondary | ICD-10-CM | POA: Diagnosis not present

## 2021-07-09 HISTORY — PX: TOTAL HIP ARTHROPLASTY: SHX124

## 2021-07-09 LAB — CBC
HCT: 39 % (ref 39.0–52.0)
Hemoglobin: 12.5 g/dL — ABNORMAL LOW (ref 13.0–17.0)
MCH: 29.5 pg (ref 26.0–34.0)
MCHC: 32.1 g/dL (ref 30.0–36.0)
MCV: 92 fL (ref 80.0–100.0)
Platelets: 177 10*3/uL (ref 150–400)
RBC: 4.24 MIL/uL (ref 4.22–5.81)
RDW: 13.8 % (ref 11.5–15.5)
WBC: 9 10*3/uL (ref 4.0–10.5)
nRBC: 0 % (ref 0.0–0.2)

## 2021-07-09 LAB — BASIC METABOLIC PANEL
Anion gap: 13 (ref 5–15)
BUN: 13 mg/dL (ref 8–23)
CO2: 25 mmol/L (ref 22–32)
Calcium: 9.7 mg/dL (ref 8.9–10.3)
Chloride: 101 mmol/L (ref 98–111)
Creatinine, Ser: 0.93 mg/dL (ref 0.61–1.24)
GFR, Estimated: >60 mL/min (ref >60)
Glucose, Bld: 125 mg/dL — ABNORMAL HIGH (ref 70–99)
Potassium: 4.1 mmol/L (ref 3.5–5.1)
Sodium: 139 mmol/L (ref 135–145)

## 2021-07-09 LAB — SURGICAL PCR SCREEN
MRSA, PCR: NEGATIVE
Staphylococcus aureus: NEGATIVE

## 2021-07-09 LAB — CREATININE, SERUM
Creatinine, Ser: 0.96 mg/dL (ref 0.61–1.24)
GFR, Estimated: >60 mL/min (ref >60)

## 2021-07-09 LAB — VITAMIN D 25 HYDROXY (VIT D DEFICIENCY, FRACTURES): Vit D, 25-Hydroxy: 54.49 ng/mL (ref 30–100)

## 2021-07-09 SURGERY — ARTHROPLASTY, HIP, TOTAL, ANTERIOR APPROACH
Anesthesia: Spinal | Site: Hip | Laterality: Right

## 2021-07-09 MED ORDER — METOCLOPRAMIDE HCL 5 MG/ML IJ SOLN: 5.0000 mg | Freq: Three times a day (TID) | INTRAMUSCULAR | Status: DC | PRN

## 2021-07-09 MED ORDER — FENTANYL CITRATE (PF) 100 MCG/2ML IJ SOLN
INTRAMUSCULAR | Status: AC
  Filled 2021-07-09: qty 2

## 2021-07-09 MED ORDER — TRANEXAMIC ACID-NACL 1000-0.7 MG/100ML-% IV SOLN
INTRAVENOUS | Status: DC | PRN
Start: 2021-07-09 — End: 2021-07-09
  Administered 2021-07-09: 1000 mg via INTRAVENOUS

## 2021-07-09 MED ORDER — SODIUM CHLORIDE 0.9 % IV SOLN: INTRAVENOUS | Status: AC

## 2021-07-09 MED ORDER — ONDANSETRON HCL 4 MG PO TABS: 4.0000 mg | ORAL_TABLET | Freq: Four times a day (QID) | ORAL | Status: DC | PRN

## 2021-07-09 MED ORDER — TIMOLOL MALEATE 0.5 % OP SOLN
1.0000 [drp] | Freq: Two times a day (BID) | OPHTHALMIC | Status: DC
  Administered 2021-07-09 – 2021-07-13 (×9): 1 [drp] via OPHTHALMIC
  Filled 2021-07-09: qty 5

## 2021-07-09 MED ORDER — METOPROLOL SUCCINATE ER 25 MG PO TB24
12.5000 mg | ORAL_TABLET | Freq: Every day | ORAL | Status: DC
  Administered 2021-07-09 – 2021-07-13 (×5): 12.5 mg via ORAL
  Filled 2021-07-09 (×5): qty 1

## 2021-07-09 MED ORDER — LACTATED RINGERS IV SOLN: INTRAVENOUS | Status: DC

## 2021-07-09 MED ORDER — KETOROLAC TROMETHAMINE 30 MG/ML IJ SOLN
INTRAMUSCULAR | Status: DC | PRN
  Administered 2021-07-09: 30 mg via INTRA_ARTICULAR

## 2021-07-09 MED ORDER — CHLORHEXIDINE GLUCONATE 4 % EX LIQD: 60.0000 mL | Freq: Once | CUTANEOUS | Status: DC

## 2021-07-09 MED ORDER — KETOROLAC TROMETHAMINE 30 MG/ML IJ SOLN
INTRAMUSCULAR | Status: AC
  Filled 2021-07-09: qty 1

## 2021-07-09 MED ORDER — PHENYLEPHRINE HCL (PRESSORS) 10 MG/ML IV SOLN
INTRAVENOUS | Status: AC
  Filled 2021-07-09: qty 1

## 2021-07-09 MED ORDER — ISOPROPYL ALCOHOL 70 % SOLN
Status: DC | PRN
  Administered 2021-07-09: 1 via TOPICAL

## 2021-07-09 MED ORDER — PROPOFOL 500 MG/50ML IV EMUL
INTRAVENOUS | Status: DC | PRN
  Administered 2021-07-09: 75 ug/kg/min via INTRAVENOUS

## 2021-07-09 MED ORDER — SODIUM CHLORIDE 0.9 % IV SOLN
INTRAVENOUS | Status: AC
  Filled 2021-07-09: qty 2

## 2021-07-09 MED ORDER — WATER FOR IRRIGATION, STERILE IR SOLN
Status: DC | PRN
  Administered 2021-07-09: 2000 mL

## 2021-07-09 MED ORDER — DOCUSATE SODIUM 100 MG PO CAPS
100.0000 mg | ORAL_CAPSULE | Freq: Two times a day (BID) | ORAL | Status: DC
  Administered 2021-07-09 – 2021-07-13 (×8): 100 mg via ORAL
  Filled 2021-07-09 (×8): qty 1

## 2021-07-09 MED ORDER — HYDROCODONE-ACETAMINOPHEN 7.5-325 MG PO TABS: 1.0000 | ORAL_TABLET | Freq: Three times a day (TID) | ORAL | Status: DC | PRN

## 2021-07-09 MED ORDER — ONDANSETRON HCL 4 MG/2ML IJ SOLN
4.0000 mg | Freq: Four times a day (QID) | INTRAMUSCULAR | Status: DC | PRN
  Administered 2021-07-11: 4 mg via INTRAVENOUS
  Filled 2021-07-09: qty 2

## 2021-07-09 MED ORDER — SODIUM CHLORIDE 0.9 % IV SOLN
2.0000 g | Freq: Four times a day (QID) | INTRAVENOUS | Status: AC
  Administered 2021-07-09 – 2021-07-10 (×2): 2 g via INTRAVENOUS
  Filled 2021-07-09 (×2): qty 2

## 2021-07-09 MED ORDER — SODIUM CHLORIDE 0.9 % IV SOLN: 2.0000 g | INTRAVENOUS | Status: AC

## 2021-07-09 MED ORDER — DOCUSATE SODIUM 100 MG PO CAPS: 100.0000 mg | ORAL_CAPSULE | Freq: Two times a day (BID) | ORAL | Status: DC

## 2021-07-09 MED ORDER — LATANOPROST 0.005 % OP SOLN
1.0000 [drp] | Freq: Every day | OPHTHALMIC | Status: DC
  Administered 2021-07-09 – 2021-07-12 (×4): 1 [drp] via OPHTHALMIC
  Filled 2021-07-09: qty 2.5

## 2021-07-09 MED ORDER — FENTANYL CITRATE (PF) 100 MCG/2ML IJ SOLN: 25.0000 ug | INTRAMUSCULAR | Status: DC | PRN

## 2021-07-09 MED ORDER — MUPIROCIN 2 % EX OINT
1.0000 "application " | TOPICAL_OINTMENT | Freq: Two times a day (BID) | CUTANEOUS | Status: DC
  Administered 2021-07-09 – 2021-07-13 (×8): 1 via NASAL
  Filled 2021-07-09: qty 22

## 2021-07-09 MED ORDER — SODIUM CHLORIDE (PF) 0.9 % IJ SOLN
INTRAMUSCULAR | Status: DC | PRN
  Administered 2021-07-09: 30 mL

## 2021-07-09 MED ORDER — BUPIVACAINE-EPINEPHRINE 0.25% -1:200000 IJ SOLN
INTRAMUSCULAR | Status: DC | PRN
  Administered 2021-07-09: 30 mL

## 2021-07-09 MED ORDER — METHOCARBAMOL 500 MG PO TABS
500.0000 mg | ORAL_TABLET | Freq: Four times a day (QID) | ORAL | Status: DC | PRN
  Administered 2021-07-09 – 2021-07-13 (×9): 500 mg via ORAL
  Filled 2021-07-09 (×10): qty 1

## 2021-07-09 MED ORDER — CEFAZOLIN SODIUM-DEXTROSE 2-3 GM-%(50ML) IV SOLR
INTRAVENOUS | Status: DC | PRN
Start: 2021-07-09 — End: 2021-07-09
  Administered 2021-07-09: 2 g via INTRAVENOUS

## 2021-07-09 MED ORDER — MENTHOL 3 MG MT LOZG: 1.0000 | LOZENGE | OROMUCOSAL | Status: DC | PRN

## 2021-07-09 MED ORDER — TRANEXAMIC ACID-NACL 1000-0.7 MG/100ML-% IV SOLN
1000.0000 mg | Freq: Once | INTRAVENOUS | Status: AC
  Administered 2021-07-09: 1000 mg via INTRAVENOUS
  Filled 2021-07-09: qty 100

## 2021-07-09 MED ORDER — DEXAMETHASONE SODIUM PHOSPHATE 10 MG/ML IJ SOLN
INTRAMUSCULAR | Status: DC | PRN
  Administered 2021-07-09: 6 mg via INTRAVENOUS

## 2021-07-09 MED ORDER — BRIMONIDINE TARTRATE 0.2 % OP SOLN
1.0000 [drp] | Freq: Two times a day (BID) | OPHTHALMIC | Status: DC
  Administered 2021-07-09 – 2021-07-13 (×9): 1 [drp] via OPHTHALMIC
  Filled 2021-07-09: qty 5

## 2021-07-09 MED ORDER — MIDAZOLAM HCL 5 MG/5ML IJ SOLN
INTRAMUSCULAR | Status: DC | PRN
  Administered 2021-07-09: 2 mg via INTRAVENOUS

## 2021-07-09 MED ORDER — POVIDONE-IODINE 10 % EX SWAB: 2.0000 "application " | Freq: Once | CUTANEOUS | Status: DC

## 2021-07-09 MED ORDER — METHOCARBAMOL 1000 MG/10ML IJ SOLN
500.0000 mg | Freq: Four times a day (QID) | INTRAVENOUS | Status: DC | PRN
  Filled 2021-07-09: qty 5

## 2021-07-09 MED ORDER — SENNA 8.6 MG PO TABS: 3.0000 | ORAL_TABLET | ORAL | Status: DC

## 2021-07-09 MED ORDER — FENTANYL CITRATE (PF) 100 MCG/2ML IJ SOLN
INTRAMUSCULAR | Status: DC | PRN
  Administered 2021-07-09: 50 ug via INTRAVENOUS

## 2021-07-09 MED ORDER — HYDROMORPHONE HCL 1 MG/ML IJ SOLN
0.5000 mg | INTRAMUSCULAR | Status: DC | PRN
  Administered 2021-07-09 – 2021-07-12 (×4): 0.5 mg via INTRAVENOUS
  Filled 2021-07-09 (×4): qty 0.5

## 2021-07-09 MED ORDER — PRENATAL MULTIVITAMIN CH
1.0000 | ORAL_TABLET | Freq: Every day | ORAL | Status: DC
  Administered 2021-07-10 – 2021-07-13 (×4): 1 via ORAL
  Filled 2021-07-09 (×5): qty 1

## 2021-07-09 MED ORDER — MIDAZOLAM HCL 2 MG/2ML IJ SOLN
INTRAMUSCULAR | Status: AC
  Filled 2021-07-09: qty 2

## 2021-07-09 MED ORDER — PROPOFOL 10 MG/ML IV BOLUS
INTRAVENOUS | Status: DC | PRN
  Administered 2021-07-09: 20 mg via INTRAVENOUS

## 2021-07-09 MED ORDER — HYDRALAZINE HCL 10 MG PO TABS
10.0000 mg | ORAL_TABLET | Freq: Four times a day (QID) | ORAL | Status: DC | PRN
  Filled 2021-07-09 (×2): qty 1

## 2021-07-09 MED ORDER — PANTOPRAZOLE SODIUM 40 MG PO TBEC
40.0000 mg | DELAYED_RELEASE_TABLET | Freq: Every day | ORAL | Status: DC
  Administered 2021-07-10 – 2021-07-13 (×4): 40 mg via ORAL
  Filled 2021-07-09 (×4): qty 1

## 2021-07-09 MED ORDER — ENSURE SURGERY PO LIQD
237.0000 mL | Freq: Two times a day (BID) | ORAL | Status: DC
  Administered 2021-07-09 – 2021-07-13 (×8): 237 mL via ORAL

## 2021-07-09 MED ORDER — SODIUM CHLORIDE 0.9 % IR SOLN
Status: DC | PRN
  Administered 2021-07-09: 1000 mL

## 2021-07-09 MED ORDER — METHOCARBAMOL 500 MG PO TABS: 500.0000 mg | ORAL_TABLET | Freq: Four times a day (QID) | ORAL | Status: DC | PRN

## 2021-07-09 MED ORDER — TRAVOPROST 0.004 % OP SOLN: 1.0000 [drp] | Freq: Every day | OPHTHALMIC | Status: DC

## 2021-07-09 MED ORDER — TRANEXAMIC ACID-NACL 1000-0.7 MG/100ML-% IV SOLN
INTRAVENOUS | Status: AC
  Filled 2021-07-09: qty 100

## 2021-07-09 MED ORDER — ACETAMINOPHEN 325 MG PO TABS: 650.0000 mg | ORAL_TABLET | Freq: Four times a day (QID) | ORAL | Status: DC | PRN

## 2021-07-09 MED ORDER — PROPOFOL 1000 MG/100ML IV EMUL
INTRAVENOUS | Status: AC
  Filled 2021-07-09: qty 100

## 2021-07-09 MED ORDER — AMISULPRIDE (ANTIEMETIC) 5 MG/2ML IV SOLN: 10.0000 mg | Freq: Once | INTRAVENOUS | Status: DC | PRN

## 2021-07-09 MED ORDER — BUPIVACAINE IN DEXTROSE 0.75-8.25 % IT SOLN
INTRATHECAL | Status: DC | PRN
  Administered 2021-07-09: 1.8 mL via INTRATHECAL

## 2021-07-09 MED ORDER — ENOXAPARIN SODIUM 40 MG/0.4ML IJ SOSY
40.0000 mg | PREFILLED_SYRINGE | INTRAMUSCULAR | Status: DC
  Administered 2021-07-10 – 2021-07-13 (×4): 40 mg via SUBCUTANEOUS
  Filled 2021-07-09 (×4): qty 0.4

## 2021-07-09 MED ORDER — BRIMONIDINE TARTRATE-TIMOLOL 0.2-0.5 % OP SOLN: 1.0000 [drp] | Freq: Two times a day (BID) | OPHTHALMIC | Status: DC

## 2021-07-09 MED ORDER — METOCLOPRAMIDE HCL 5 MG PO TABS: 5.0000 mg | ORAL_TABLET | Freq: Three times a day (TID) | ORAL | Status: DC | PRN

## 2021-07-09 MED ORDER — HYDROCODONE-ACETAMINOPHEN 7.5-325 MG PO TABS
1.0000 | ORAL_TABLET | ORAL | Status: DC | PRN
  Administered 2021-07-10 – 2021-07-13 (×10): 2 via ORAL
  Filled 2021-07-09 (×10): qty 2

## 2021-07-09 MED ORDER — HEPARIN SODIUM (PORCINE) 5000 UNIT/ML IJ SOLN: 5000.0000 [IU] | Freq: Once | INTRAMUSCULAR | Status: DC

## 2021-07-09 MED ORDER — BUPIVACAINE-EPINEPHRINE (PF) 0.25% -1:200000 IJ SOLN
INTRAMUSCULAR | Status: AC
  Filled 2021-07-09: qty 30

## 2021-07-09 MED ORDER — TRANEXAMIC ACID-NACL 1000-0.7 MG/100ML-% IV SOLN: 1000.0000 mg | INTRAVENOUS | Status: DC

## 2021-07-09 MED ORDER — ONDANSETRON HCL 4 MG/2ML IJ SOLN
INTRAMUSCULAR | Status: DC | PRN
  Administered 2021-07-09: 4 mg via INTRAVENOUS

## 2021-07-09 MED ORDER — PHENYLEPHRINE HCL-NACL 10-0.9 MG/250ML-% IV SOLN
INTRAVENOUS | Status: DC | PRN
  Administered 2021-07-09: 25 ug/min via INTRAVENOUS

## 2021-07-09 MED ORDER — HYDROCODONE-ACETAMINOPHEN 5-325 MG PO TABS
1.0000 | ORAL_TABLET | ORAL | Status: DC | PRN
  Administered 2021-07-09: 1 via ORAL
  Administered 2021-07-10 – 2021-07-13 (×5): 2 via ORAL
  Filled 2021-07-09 (×6): qty 2
  Filled 2021-07-09: qty 1

## 2021-07-09 MED ORDER — PHENOL 1.4 % MT LIQD: 1.0000 | OROMUCOSAL | Status: DC | PRN

## 2021-07-09 MED ORDER — ZOLPIDEM TARTRATE 5 MG PO TABS
5.0000 mg | ORAL_TABLET | Freq: Once | ORAL | Status: AC
  Administered 2021-07-09: 5 mg via ORAL
  Filled 2021-07-09: qty 1

## 2021-07-09 MED ORDER — MORPHINE SULFATE (PF) 2 MG/ML IV SOLN: 0.5000 mg | INTRAVENOUS | Status: DC | PRN

## 2021-07-09 MED ORDER — ONDANSETRON HCL 4 MG/2ML IJ SOLN
4.0000 mg | Freq: Four times a day (QID) | INTRAMUSCULAR | Status: DC | PRN
  Administered 2021-07-09: 4 mg via INTRAVENOUS
  Filled 2021-07-09: qty 2

## 2021-07-09 MED ORDER — ACETAMINOPHEN 325 MG PO TABS: 325.0000 mg | ORAL_TABLET | Freq: Four times a day (QID) | ORAL | Status: DC | PRN

## 2021-07-09 SURGICAL SUPPLY — 63 items
ADH SKN CLS APL DERMABOND .7 (GAUZE/BANDAGES/DRESSINGS) ×1
APL PRP STRL LF DISP 70% ISPRP (MISCELLANEOUS) ×1
ARTICULEZE HEAD (Hips) ×2 IMPLANT
BAG COUNTER SPONGE SURGICOUNT (BAG) IMPLANT
BAG DECANTER FOR FLEXI CONT (MISCELLANEOUS) IMPLANT
BAG SPEC THK2 15X12 ZIP CLS (MISCELLANEOUS)
BAG SPNG CNTER NS LX DISP (BAG)
BAG ZIPLOCK 12X15 (MISCELLANEOUS) IMPLANT
BLADE SURG SZ10 CARB STEEL (BLADE) IMPLANT
CHLORAPREP W/TINT 26 (MISCELLANEOUS) ×2 IMPLANT
COVER PERINEAL POST (MISCELLANEOUS) ×2 IMPLANT
COVER SURGICAL LIGHT HANDLE (MISCELLANEOUS) ×2 IMPLANT
CUP SECTOR GRIPTON 58MM (Orthopedic Implant) ×1 IMPLANT
DECANTER SPIKE VIAL GLASS SM (MISCELLANEOUS) ×2 IMPLANT
DERMABOND ADVANCED (GAUZE/BANDAGES/DRESSINGS) ×1
DERMABOND ADVANCED .7 DNX12 (GAUZE/BANDAGES/DRESSINGS) ×2 IMPLANT
DRAPE IMP U-DRAPE 54X76 (DRAPES) ×2 IMPLANT
DRAPE SHEET LG 3/4 BI-LAMINATE (DRAPES) ×6 IMPLANT
DRAPE STERI IOBAN 125X83 (DRAPES) IMPLANT
DRAPE U-SHAPE 47X51 STRL (DRAPES) ×4 IMPLANT
DRSG AQUACEL AG ADV 3.5X10 (GAUZE/BANDAGES/DRESSINGS) ×2 IMPLANT
ELECT REM PT RETURN 15FT ADLT (MISCELLANEOUS) ×2 IMPLANT
GAUZE SPONGE 4X4 12PLY STRL (GAUZE/BANDAGES/DRESSINGS) ×2 IMPLANT
GLOVE SRG 8 PF TXTR STRL LF DI (GLOVE) ×1 IMPLANT
GLOVE SURG ENC MOIS LTX SZ8.5 (GLOVE) ×4 IMPLANT
GLOVE SURG ENC TEXT LTX SZ7.5 (GLOVE) ×4 IMPLANT
GLOVE SURG UNDER POLY LF SZ8 (GLOVE) ×2
GLOVE SURG UNDER POLY LF SZ8.5 (GLOVE) ×2 IMPLANT
GOWN SPEC L3 XXLG W/TWL (GOWN DISPOSABLE) ×2 IMPLANT
GOWN STRL REUS W/TWL XL LVL3 (GOWN DISPOSABLE) ×2 IMPLANT
HANDPIECE INTERPULSE COAX TIP (DISPOSABLE) ×2
HEAD ARTICULEZE (Hips) IMPLANT
HOLDER FOLEY CATH W/STRAP (MISCELLANEOUS) ×2 IMPLANT
HOOD PEEL AWAY FLYTE STAYCOOL (MISCELLANEOUS) ×8 IMPLANT
JET LAVAGE IRRISEPT WOUND (IRRIGATION / IRRIGATOR)
KIT TURNOVER KIT A (KITS) ×2 IMPLANT
LAVAGE JET IRRISEPT WOUND (IRRIGATION / IRRIGATOR) IMPLANT
LINER NEUTRAL 52X36X58N (Liner) ×1 IMPLANT
MANIFOLD NEPTUNE II (INSTRUMENTS) ×2 IMPLANT
MARKER SKIN DUAL TIP RULER LAB (MISCELLANEOUS) ×2 IMPLANT
NDL SAFETY ECLIPSE 18X1.5 (NEEDLE) ×1 IMPLANT
NDL SPNL 18GX3.5 QUINCKE PK (NEEDLE) ×1 IMPLANT
NEEDLE HYPO 18GX1.5 SHARP (NEEDLE) ×2
NEEDLE SPNL 18GX3.5 QUINCKE PK (NEEDLE) ×2 IMPLANT
PACK ANTERIOR HIP CUSTOM (KITS) ×2 IMPLANT
PENCIL SMOKE EVACUATOR (MISCELLANEOUS) IMPLANT
SAW OSC TIP CART 19.5X105X1.3 (SAW) ×2 IMPLANT
SEALER BIPOLAR AQUA 6.0 (INSTRUMENTS) ×2 IMPLANT
SET HNDPC FAN SPRY TIP SCT (DISPOSABLE) ×1 IMPLANT
STEM TRI LOC BPS GRIP SZ9 OFFS IMPLANT
SUT MNCRL AB 3-0 PS2 18 (SUTURE) ×2 IMPLANT
SUT MNCRL AB 4-0 PS2 18 (SUTURE) ×2 IMPLANT
SUT MON AB 2-0 CT1 36 (SUTURE) ×4 IMPLANT
SUT STRATAFIX PDO 1 14 VIOLET (SUTURE) ×2
SUT STRATFX PDO 1 14 VIOLET (SUTURE) ×1
SUT VIC AB 2-0 CT1 27 (SUTURE) ×2
SUT VIC AB 2-0 CT1 TAPERPNT 27 (SUTURE) ×1 IMPLANT
SUTURE STRATFX PDO 1 14 VIOLET (SUTURE) ×1 IMPLANT
SYR 3ML LL SCALE MARK (SYRINGE) ×2 IMPLANT
TRAY FOLEY MTR SLVR 16FR STAT (SET/KITS/TRAYS/PACK) IMPLANT
TRI LOC BPS W/GRIP SZ 9 OFFS ×2 IMPLANT
TUBE SUCTION HIGH CAP CLEAR NV (SUCTIONS) ×2 IMPLANT
WATER STERILE IRR 1000ML POUR (IV SOLUTION) ×2 IMPLANT

## 2021-07-09 NOTE — Progress Notes (Signed)
Paged admitting to inform them that the patient arrived to the unit from [facility].

## 2021-07-09 NOTE — Progress Notes (Signed)
PT Cancellation Note  Patient Details Name: ZYMIER RODGERS [name]: [id number] DOB: 1941/10/14   Cancelled Treatment:    Reason Eval/Treat Not Completed: Medical issues which prohibited therapy (Surgical repair of hip fx planned for today. Will follow.)  Philomena Doheny PT 07/09/2021  Acute Rehabilitation Services Pager [phone] Office [phone]

## 2021-07-09 NOTE — Anesthesia Preprocedure Evaluation (Signed)
Anesthesia Evaluation  Patient identified by [name], date of birth, ID band Patient awake    Reviewed: Allergy & Precautions, NPO status , Patient's Chart, lab work & pertinent test results  Airway Mallampati: II  TM Distance: >3 FB     Dental  (+) Dental Advisory Given   Pulmonary neg pulmonary ROS,    breath sounds clear to auscultation       Cardiovascular hypertension, Pt. on medications  Rhythm:Regular Rate:Normal     Neuro/Psych  Headaches,    GI/Hepatic Neg liver ROS, PUD, GERD  ,  Endo/Other  negative endocrine ROS  Renal/GU Renal disease     Musculoskeletal  (+) Arthritis ,   Abdominal   Peds  Hematology  (+) anemia ,   Anesthesia Other Findings   Reproductive/Obstetrics                             Anesthesia Physical Anesthesia Plan  ASA: 3  Anesthesia Plan: Spinal   Post-op Pain Management:    Induction:   PONV Risk Score and Plan: 1 and Propofol infusion, Ondansetron and Treatment may vary due to age or medical condition  Airway Management Planned: Natural Airway and Simple Face Mask  Additional Equipment:   Intra-op Plan:   Post-operative Plan:   Informed Consent: I have reviewed the patients History and Physical, chart, labs and discussed the procedure including the risks, benefits and alternatives for the proposed anesthesia with the patient or authorized representative who has indicated his/her understanding and acceptance.       Plan Discussed with:   Anesthesia Plan Comments:         Anesthesia Quick Evaluation

## 2021-07-09 NOTE — TOC Initial Note (Addendum)
Transition of Care [facility]) - Initial/Assessment Note    Patient Details  Name: Andrew Pacheco [name]: [id number] Date of Birth: 1941/02/08  [facility] CM/SW Contact:    Amada Jupiter, LCSW Phone Number: 07/09/2021, 11:23 AM  Clinical Narrative:                 Met with pt, wife and son Kasem, Mozer) this morning to introduce self/ TOC role.  Pt very pleasant but reports having much pain and eager to get surgery done.  Pt and wife live alone in the home with adult children in the surrounding area.  Wife does note she has some physical limitations due to her own health issues.  They are all anticipating pt will need SNF rehab following surgery.  Will assist with this once we have evaluations completed by therapies.   Son asking about assistance with applying for Medicaid - have provided him with packet on how to apply online or via paper report taken to local DSS office.   Will continue to follow. (Pt is unvaccinated for COVID) Expected Discharge Plan: Skilled Nursing Facility Barriers to Discharge: Continued Medical Work up   Patient Goals and CMS Choice Patient states their goals for this hospitalization and ongoing recovery are:: pt's goal is to be able to return home following rehab      Expected Discharge Plan and Services Expected Discharge Plan: Skilled Nursing Facility In-house Referral: Clinical Social Work   Post Acute Care Choice: Skilled Nursing Facility Living arrangements for the past 2 months: Single Family Home                                      Prior Living Arrangements/Services Living arrangements for the past 2 months: Single Family Home Lives with:: Spouse Patient language and need for interpreter reviewed:: Yes Do you feel safe going back to the place where you live?: Yes      Need for Family Participation in Patient Care: Yes (Comment) Care giver support system in place?: Yes (comment)   Criminal Activity/Legal Involvement Pertinent to Current  Situation/Hospitalization: No - Comment as needed  Activities of Daily Living Home Assistive Devices/Equipment: None ADL Screening (condition at time of admission) Patient's cognitive ability adequate to safely complete daily activities?: Yes Is the patient deaf or have difficulty hearing?: No Does the patient have difficulty seeing, even when wearing glasses/contacts?: No Does the patient have difficulty concentrating, remembering, or making decisions?: No Patient able to express need for assistance with ADLs?: No Does the patient have difficulty dressing or bathing?: No Independently performs ADLs?: Yes (appropriate for developmental age) Does the patient have difficulty walking or climbing stairs?: Yes Weakness of Legs: Right Weakness of Arms/Hands: None  Permission Sought/Granted Permission sought to share information with : Family Supports Permission granted to share information with : Yes, Verbal Permission Granted  Share Information with NAME: Hikeem Andersson     Permission granted to share info w Relationship: spouse  Permission granted to share info w Contact Information: [phone]  Emotional Assessment Appearance:: Appears stated age Attitude/Demeanor/Rapport: Gracious Affect (typically observed): Accepting, Pleasant, Quiet Orientation: : Oriented to  Time, Oriented to Self, Oriented to Place, Oriented to Situation Alcohol / Substance Use: Not Applicable Psych Involvement: No (comment)  Admission diagnosis:  Hip fracture (HCC) [S72.009A] Fall, initial encounter [W19.XXXA] Closed fracture of right hip, initial encounter [facility]) [S72.001A] Patient Active Problem List  Diagnosis Date Noted   Hip fracture ([place]) 07/08/2021   Vasodepressor syncope 09/11/2017   Vasovagal near syncope 09/11/2017   Low vitamin B12 level 09/10/2017   HTN (hypertension) 09/10/2017   Malnutrition of moderate degree 09/10/2017   Gastroesophageal reflux disease    AKI (acute kidney injury) ([place])     Glaucoma    Near syncope 09/08/2017   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

## 2021-07-09 NOTE — Discharge Instructions (Signed)
? ?Dr. Shemicka Cohrs ?Joint Replacement Specialist ?[facility] ?[address] ?[place], [place] [address] ?[phone] ? ? ?TOTAL HIP REPLACEMENT POSTOPERATIVE DIRECTIONS ? ? ? ?Hip Rehabilitation, Guidelines Following Surgery  ? ?WEIGHT BEARING ?Weight bearing as tolerated with assist device (walker, cane, etc) as directed, use it as long as suggested by your surgeon or therapist, typically at least 4-6 weeks. ? ?The results of a hip operation are greatly improved after range of motion and muscle strengthening exercises. Follow all safety measures which are given to protect your hip. If any of these exercises cause increased pain or swelling in your joint, decrease the amount until you are comfortable again. Then slowly increase the exercises. Call your caregiver if you have problems or questions.  ? ?HOME CARE INSTRUCTIONS  ?Most of the following instructions are designed to prevent the dislocation of your new hip.  ?Remove items at home which could result in a fall. This includes throw rugs or furniture in walking pathways.  ?Continue medications as instructed at time of discharge. ?You may have some home medications which will be placed on hold until you complete the course of blood thinner medication. ?You may start showering once you are discharged home. Do not remove your dressing. ?Do not put on socks or shoes without following the instructions of your caregivers.   ?Sit on chairs with arms. Use the chair arms to help push yourself up when arising.  ?Arrange for the use of a toilet seat elevator so you are not sitting low.  ?Walk with walker as instructed.  ?You may resume a sexual relationship in one month or when given the OK by your caregiver.  ?Use walker as long as suggested by your caregivers.  ?You may put full weight on your legs and walk as much as is comfortable. ?Avoid periods of inactivity such as sitting longer than an hour when not asleep. This helps prevent blood  clots.  ?You may return to work once you are cleared by your surgeon.  ?Do not drive a car for 6 weeks or until released by your surgeon.  ?Do not drive while taking narcotics.  ?Wear elastic stockings for two weeks following surgery during the day but you may remove then at night.  ?Make sure you keep all of your appointments after your operation with all of your doctors and caregivers. You should call the office at the above phone number and make an appointment for approximately two weeks after the date of your surgery. ?Please pick up a stool softener and laxative for home use as long as you are requiring pain medications. ?ICE to the affected hip every three hours for 30 minutes at a time and then as needed for pain and swelling. Continue to use ice on the hip for pain and swelling from surgery. You may notice swelling that will progress down to the foot and ankle.  This is normal after surgery.  Elevate the leg when you are not up walking on it.   ?It is important for you to complete the blood thinner medication as prescribed by your doctor. ?Continue to use the breathing machine which will help keep your temperature down.  It is common for your temperature to cycle up and down following surgery, especially at night when you are not up moving around and exerting yourself.  The breathing machine keeps your lungs expanded and your temperature down. ? ?RANGE OF MOTION AND STRENGTHENING EXERCISES  ?These exercises are designed to help you   keep full movement of your hip joint. Follow your caregiver's or physical therapist's instructions. Perform all exercises about fifteen times, three times per day or as directed. Exercise both hips, even if you have had only one joint replacement. These exercises can be done on a training (exercise) mat, on the floor, on a table or on a bed. Use whatever works the best and is most comfortable for you. Use music or television while you are exercising so that the exercises are a  pleasant break in your day. This will make your life better with the exercises acting as a break in routine you can look forward to.  ?Lying on your back, slowly slide your foot toward your buttocks, raising your knee up off the floor. Then slowly slide your foot back down until your leg is straight again.  ?Lying on your back spread your legs as far apart as you can without causing discomfort.  ?Lying on your side, raise your upper leg and foot straight up from the floor as far as is comfortable. Slowly lower the leg and repeat.  ?Lying on your back, tighten up the muscle in the front of your thigh (quadriceps muscles). You can do this by keeping your leg straight and trying to raise your heel off the floor. This helps strengthen the largest muscle supporting your knee.  ?Lying on your back, tighten up the muscles of your buttocks both with the legs straight and with the knee bent at a comfortable angle while keeping your heel on the floor.  ? ?SKILLED REHAB INSTRUCTIONS: ?If the patient is transferred to a skilled rehab facility following release from the hospital, a list of the current medications will be sent to the facility for the patient to continue.  When discharged from the skilled rehab facility, please have the facility set up the patient's Home Health Physical Therapy prior to being released. Also, the skilled facility will be responsible for providing the patient with their medications at time of release from the facility to include their pain medication and their blood thinner medication. If the patient is still at the rehab facility at time of the two week follow up appointment, the skilled rehab facility will also need to assist the patient in arranging follow up appointment in our office and any transportation needs. ? ?POST-OPERATIVE OPIOID TAPER INSTRUCTIONS: ?It is important to wean off of your opioid medication as soon as possible. If you do not need pain medication after your surgery it is ok  to stop day one. ?Opioids include: ?Codeine, Hydrocodone(Norco, Vicodin), Oxycodone(Percocet, oxycontin) and hydromorphone amongst others.  ?Long term and even short term use of opiods can cause: ?Increased pain response ?Dependence ?Constipation ?Depression ?Respiratory depression ?And more.  ?Withdrawal symptoms can include ?Flu like symptoms ?Nausea, vomiting ?And more ?Techniques to manage these symptoms ?Hydrate well ?Eat regular healthy meals ?Stay active ?Use relaxation techniques(deep breathing, meditating, yoga) ?Do Not substitute Alcohol to help with tapering ?If you have been on opioids for less than two weeks and do not have pain than it is ok to stop all together.  ?Plan to wean off of opioids ?This plan should start within one week post op of your joint replacement. ?Maintain the same interval or time between taking each dose and first decrease the dose.  ?Cut the total daily intake of opioids by one tablet each day ?Next start to increase the time between doses. ?The last dose that should be eliminated is the evening dose.  ? ? ?MAKE   SURE YOU:  ?Understand these instructions.  ?Will watch your condition.  ?Will get help right away if you are not doing well or get worse. ? ?Pick up stool softner and laxative for home use following surgery while on pain medications. ?Do not remove your dressing. ?The dressing is waterproof--it is OK to take showers. ?Continue to use ice for pain and swelling after surgery. ?Do not use any lotions or creams on the incision until instructed by your surgeon. ?Total Hip Protocol. ? ?

## 2021-07-09 NOTE — ED Notes (Signed)
[facility] called for transport to [facility]. Transport is set up.

## 2021-07-09 NOTE — Consult Note (Signed)
ORTHOPAEDIC CONSULTATION  REQUESTING PHYSICIAN: Bonnielee Haff, MD  PCP:  [name], MD  Chief Complaint: Right hip pain  HPI: Andrew Pacheco is a 80 y.o. male who complains of right hip pain secondary to a fall. The patient has PMH significant for essential hypertension, anemia, GERD, migraine.  Patient stated that he mis-stepped and  fell on his right side. EMS brought him to the emergency department where he was found to have a right femoral neck fracture. Orthopedics was consulted for management of the right femoral neck fracture  Past Medical History:  Diagnosis Date   Anemia    Arthritis    "both hips, back" (09/09/2017)   Chronic back pain    "middle and lower back" (09/09/2017)   Colon polyps    Daily headache    Diverticulosis    GERD (gastroesophageal reflux disease)    Glaucoma, both eyes    H pylori ulcer    Hard of hearing    HTN (hypertension)    Insomnia    due to disc   Lumbar disc disease    Migraine    "q 3-4 months" (09/09/2017)   Seasonal allergies    Past Surgical History:  Procedure Laterality Date   APPENDECTOMY     CYST EXCISION Right ~ 1971   knee   ESOPHAGOGASTRODUODENOSCOPY N/A 01/15/2018   Procedure: ESOPHAGOGASTRODUODENOSCOPY (EGD);  Surgeon: Ladene Artist, MD;  Location: [facility] ENDOSCOPY;  Service: Endoscopy;  Laterality: N/A;   Social History   Socioeconomic History   Marital status: Married    Spouse name: Not on file   Number of children: 2   Years of education: Not on file   Highest education level: Not on file  Occupational History   Occupation: security guard - [employer]  Tobacco Use   Smoking status: Never   Smokeless tobacco: Never  Vaping Use   Vaping Use: Never used  Substance and Sexual Activity   Alcohol use: Yes    Comment: 09/09/2017 "couple glasses of wine q 6 months"   Drug use: No   Sexual activity: Not on file  Other Topics Concern   Not on file  Social History Narrative   Not on file    Social Determinants of Health   Financial Resource Strain: Not on file  Food Insecurity: Not on file  Transportation Needs: Not on file  Physical Activity: Not on file  Stress: Not on file  Social Connections: Not on file   Family History  Problem Relation Age of Onset   Heart disease Father    Emphysema Father    Diverticulosis Father    Cancer Mother        gallbladder-mets   Arthritis Mother    Liver cancer Mother    No Known Allergies Prior to Admission medications   Medication Sig Start Date End Date Taking? Authorizing Provider  COMBIGAN 0.2-0.5 % ophthalmic solution Place 1 drop into both eyes 2 (two) times daily. 01/10/18  Yes [provider]  cyclobenzaprine (FLEXERIL) 10 MG tablet Take 10 mg by mouth as needed for muscle spasms. 12/11/19  Yes [provider]  HYDROcodone-acetaminophen (NORCO) 7.5-325 MG tablet Take 1 tablet by mouth 3 (three) times daily as needed for moderate pain. 04/15/20  Yes [provider]  ipratropium (ATROVENT) 0.06 % nasal spray Place 2 sprays into both nostrils as needed for rhinitis.   Yes [provider]  metoprolol succinate (TOPROL-XL) 25 MG 24 hr tablet Take 12.5 mg  by mouth daily. 12/21/19  Yes [provider]  pantoprazole (PROTONIX) 40 MG tablet TAKE 1 TABLET BY MOUTH EVERY DAY Patient taking differently: Take 40 mg by mouth daily. 12/22/20  Yes [name], MD  Prenatal Vit-Fe Fumarate-FA (PRENATAL VITAMINS) 28-0.8 MG TABS Take 1 tablet by mouth daily. 01/26/18  Yes [provider]  senna (SENOKOT) 8.6 MG TABS tablet Take 3 tablets by mouth every 21 ( twenty-one) days.    Yes [provider]  travoprost, benzalkonium, (TRAVATAN) 0.004 % ophthalmic solution Place 1 drop into both eyes at bedtime.   Yes [provider]  zolpidem (AMBIEN) 10 MG tablet Take 10 mg by mouth at bedtime.   Yes [provider]  aspirin EC 81 MG EC tablet Take 1 tablet (81 mg total) by  mouth daily. Patient not taking: Reported on 07/08/2021 09/12/17   Eugenie Filler, MD  atorvastatin (LIPITOR) 40 MG tablet Take 1 tablet (40 mg total) by mouth daily at 6 PM. Patient not taking: Reported on 07/08/2021 09/11/17   Eugenie Filler, MD  feeding supplement, ENSURE ENLIVE, (ENSURE ENLIVE) LIQD Take 237 mLs by mouth 2 (two) times daily between meals. Patient not taking: Reported on 07/08/2021 09/12/17   Eugenie Filler, MD  predniSONE (DELTASONE) 50 MG tablet Take 1 tablet daily with food for 5 days until finished Patient not taking: No sig reported 09/03/20   Magnus Sinning, MD  vitamin B-12 (CYANOCOBALAMIN) 1000 MCG tablet Take 1 tablet (1,000 mcg total) by mouth daily. Patient not taking: Reported on 07/08/2021 09/11/17   Eugenie Filler, MD   DG Chest 1 View  Result Date: 07/08/2021 CLINICAL DATA:  Fall EXAM: CHEST  1 VIEW COMPARISON:  09/08/2017 FINDINGS: Lungs are clear. No pneumothorax or pleural effusion. Cardiac size is within normal limits. Thoracic aorta is mildly tortuous, accentuated by poor pulmonary insufflation. Pulmonary vascularity is normal. No acute bone abnormality. IMPRESSION: No active disease. Electronically Signed   By: Fidela Salisbury MD   On: 07/08/2021 20:18   CT HEAD WO CONTRAST  Result Date: 07/08/2021 CLINICAL DATA:  [place] EXAM: CT HEAD WITHOUT CONTRAST TECHNIQUE: Contiguous axial images were obtained from the base of the skull through the vertex without intravenous contrast. COMPARISON:  09/10/2017 FINDINGS: Brain: No acute infarct or hemorrhage. Lateral ventricles and midline structures are unremarkable. No acute extra-axial fluid collections. No mass effect. Vascular: No hyperdense vessel or unexpected calcification. Skull: Normal. Negative for fracture or focal lesion. Sinuses/Orbits: No acute finding. Other: None. IMPRESSION: 1. No acute intracranial process. Electronically Signed   By: Randa Ngo M.D.   On: 07/08/2021 21:38   CT CERVICAL SPINE  WO CONTRAST  Result Date: 07/08/2021 CLINICAL DATA:  [place], neck trauma EXAM: CT CERVICAL SPINE WITHOUT CONTRAST TECHNIQUE: Multidetector CT imaging of the cervical spine was performed without intravenous contrast. Multiplanar CT image reconstructions were also generated. COMPARISON:  09/10/2017 FINDINGS: Alignment: Alignment is anatomic. Skull base and vertebrae: No acute fracture. No primary bone lesion or focal pathologic process. Soft tissues and spinal canal: No prevertebral fluid or swelling. No visible canal hematoma. Disc levels: Mild diffuse cervical facet hypertrophy, asymmetric to the left. Disc spaces are relatively well preserved. Minimal symmetrical neural foraminal encroachment from C3-4 through C5-6 due to facet hypertrophy and uncovertebral hypertrophy. Upper chest: Airway is patent.  Lung apices are clear. Other: Reconstructed images demonstrate no additional findings. IMPRESSION: 1. No acute cervical spine fracture. 2. Multilevel spondylosis and facet hypertrophy. Electronically Signed   By:  Randa Ngo M.D.   On: 07/08/2021 21:40   CT Hip Right Wo Contrast  Result Date: 07/08/2021 CLINICAL DATA:  Post fall with right hip fracture. EXAM: CT OF THE RIGHT HIP WITHOUT CONTRAST TECHNIQUE: Multidetector CT imaging of the right hip was performed according to the standard protocol. Multiplanar CT image reconstructions were also generated. COMPARISON:  Radiograph earlier today. FINDINGS: Bones/Joint/Cartilage Right femoral neck fracture is impacted as well as displaced. There is apex anterior angulation. Fracture extends to the posterior aspect of the head neck junction. Femoral head remains seated. There is mild right hip joint space narrowing. Intact pubic rami. 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 No hip joint effusion. Ligaments Suboptimally assessed by CT. Muscles and Tendons Mild fatty atrophy of gluteus  medius and minimus muscle with calcification of the tendon, suggesting remote injury. No intramuscular hematoma. Soft tissues Mild soft tissue edema posterolaterally. The urinary bladder is distended. Colonic diverticulosis partially included. IMPRESSION: 1. Right femoral neck fracture is impacted as well as displaced. Fracture extends to the posterior aspect of the head neck junction. 2. 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 3. Mild fatty atrophy of gluteus medius and minimus muscle with tendinous calcification, suggesting remote injury. Electronically Signed   By: Keith Rake M.D.   On: 07/08/2021 22:10   DG Knee Complete 4 Views Right  Result Date: 07/08/2021 CLINICAL DATA:  Patient BIB [facility]. Patient fell in [place], possible right hip fx. EXAM: RIGHT KNEE - COMPLETE 4+ VIEW COMPARISON:  None. FINDINGS: No evidence of fracture, dislocation, or joint effusion. No evidence of severe arthropathy. No aggressive appearing focal bone abnormality. Soft tissues are unremarkable. IMPRESSION: No acute displaced fracture or dislocation of the right knee. Electronically Signed   By: Iven Finn M.D.   On: 07/08/2021 20:21   DG Hip Unilat  With Pelvis 2-3 Views Right  Result Date: 07/08/2021 CLINICAL DATA:  Right hip injury, right hip pain EXAM: DG HIP (WITH OR WITHOUT PELVIS) 2-3V RIGHT COMPARISON:  None. FINDINGS: Single view radiograph pelvis and two view radiograph right hip demonstrate an acute, impacted, subcapital right femoral neck fracture. The femoral head is still seated within the right acetabulum. The right hip joint space is preserved. Limited image of the left hip is unremarkable. The pelvis is intact. IMPRESSION: Acute, impacted right subcapital femoral neck fracture. Electronically Signed   By: Fidela Salisbury MD   On: 07/08/2021 19:39    Positive ROS: All other systems have been reviewed and were otherwise  negative with the exception of those mentioned in the HPI and as above.  Physical Exam: General: Alert, no acute distress Cardiovascular: No pedal edema Respiratory: No cyanosis, no use of accessory musculature GI: No organomegaly, abdomen is soft and non-tender Skin: No lesions in the area of chief complaint Neurologic: Sensation intact distally Psychiatric: Patient is competent for consent with normal mood and affect Lymphatic: No axillary or cervical lymphadenopathy  MUSCULOSKELETAL: RLE externally rotated. Palpable pedal pulse. Plantar and dorsiflexion intact.   Assessment: Acute, impacted right subcapital femoral neck fracture  Plan: Right hip fracture:  Discussed the risks and benefits of total hip arthroplasty with the patient and his son. Consent was obtained for R THA. NPO. Hold chemical DVT prophylaxis. Surgery this afternoon.   HTN, GERD, HLD, Anemia: Treat per hospitalist recommendations    Dorothyann Peng, [place] [phone]    07/09/2021 12:11 PM

## 2021-07-09 NOTE — Anesthesia Procedure Notes (Addendum)
Spinal  Patient location during procedure: OR Start time: 07/09/2021 12:57 PM End time: 07/09/2021 1:03 PM Reason for block: surgical anesthesia Staffing Performed: anesthesiologist  Anesthesiologist: Suzette Battiest, MD Preanesthetic Checklist Completed: patient identified, IV checked, site marked, risks and benefits discussed, surgical consent, monitors and equipment checked, pre-op evaluation and timeout performed Spinal Block Patient position: sitting Prep: DuraPrep Patient monitoring: heart rate, cardiac monitor, continuous pulse ox and blood pressure Approach: midline Location: L4-5 Injection technique: single-shot Needle Needle type: Pencan  Needle gauge: 24 G Needle length: 9 cm Assessment Sensory level: T4 Events: CSF return

## 2021-07-09 NOTE — Transfer of Care (Signed)
Immediate Anesthesia Transfer of Care Note  Patient: Andrew Pacheco  Procedure(s) Performed: TOTAL HIP ARTHROPLASTY ANTERIOR APPROACH (Right: Hip)  Patient Location: PACU  Anesthesia Type:Spinal  Level of Consciousness: sedated, patient cooperative and responds to stimulation  Airway & Oxygen Therapy: Patient Spontanous Breathing and Patient connected to face mask oxygen  Post-op Assessment: Report given to RN and Post -op Vital signs reviewed and stable  Post vital signs: Reviewed and stable  Last Vitals:  Vitals Value Taken Time  BP 124/68 07/09/21 1453  Temp    Pulse 83 07/09/21 1455  Resp 18 07/09/21 1455  SpO2 100 % 07/09/21 1455  Vitals shown include unvalidated device data.  Last Pain:  Vitals:   07/09/21 1147  TempSrc: Oral  PainSc:       Patients Stated Pain Goal: 3 (33/74/45 1460)  Complications: No notable events documented.

## 2021-07-09 NOTE — ED Notes (Signed)
[facility] here to transport the pt to  [facility]

## 2021-07-09 NOTE — ED Notes (Signed)
Report given to pam at [facility]

## 2021-07-09 NOTE — Progress Notes (Signed)
OT Cancellation Note  Patient Details Name: Andrew Pacheco [name]: [id number] DOB: 09-16-1941   Cancelled Treatment:    Reason Eval/Treat Not Completed: Patient not medically ready;Active bedrest order Pt with active bedrest orders and plan for L THA today. Will follow-up for OT eval s/p surgery.   [name] 07/09/2021, 6:57 AM

## 2021-07-09 NOTE — Plan of Care (Signed)
  Problem: Education: Goal: Knowledge of General Education information will improve Description: Including pain rating scale, medication(s)/side effects and non-pharmacologic comfort measures Outcome: Progressing   Problem: Activity: Goal: Risk for activity intolerance will decrease Outcome: Progressing   Problem: Pain Managment: Goal: General experience of comfort will improve Outcome: Progressing   

## 2021-07-09 NOTE — ED Notes (Signed)
[facility] to transfer pt over to [facility]

## 2021-07-09 NOTE — Progress Notes (Signed)
Initial Nutrition Assessment  DOCUMENTATION CODES:   Not applicable  INTERVENTION:  - diet advancement as medically feasible post-op. - will order Ensure Enlive BID, each supplement provides 330 kcal and 18 grams protein.  - complete NFPE when feasible.    NUTRITION DIAGNOSIS:   Increased nutrient needs related to hip fracture, post-op healing as evidenced by estimated needs  GOAL:   Patient will meet greater than or equal to 90% of their needs  MONITOR:   Diet advancement, PO intake, Supplement acceptance, Labs, Weight trends  REASON FOR ASSESSMENT:   Consult Hip fracture protocol  ASSESSMENT:   80 y.o. male with medical history of HTN, anemia, GERD, and migraines. He presented to the ED via EMS after a mechanical fall at home with subsequent R hip pain.  He has been NPO since admission. He is out of the room to OR for surgical fixation of L hip fx.   He has not been seen by a [facility] since 2018.  Weight today is 147 lb and PTA the most recently documented weight was on 02/14/20 when he weighed 170 lb. This indicates 23 lb weight loss (13% body weight) in the past 17 months; not significant for time frame. No information documented in the edema section of flow sheet.   Labs reviewed. Medications reviewed; 100 mg colace BID, 40 mg oral protonix/day, 1 tablet prenatal multivitamin/day, 3 tablets senokot/day.  IVF; NS @ 75 ml/hr.     NUTRITION - FOCUSED PHYSICAL EXAM:  Unable to complete at this time.   Diet Order:   Diet Order             Diet NPO time specified  Diet effective now                   EDUCATION NEEDS:   No education needs have been identified at this time  Skin:  Skin Assessment: Reviewed RN Assessment  Last BM:  7/20  Height:   Ht Readings from Last 1 Encounters:  07/09/21 '5\' 9"'$  (1.753 m)    Weight:   Wt Readings from Last 1 Encounters:  07/09/21 66.6 kg      Estimated Nutritional Needs:  Kcal:  1850-2050  kcal Protein:  90-100 grams Fluid:  >/= 1.8 L/day       Jarome Matin, MS, RD, LDN, CNSC Inpatient Clinical Dietitian RD pager # available in [facility]  After hours/weekend pager # available in [facility]

## 2021-07-09 NOTE — Op Note (Signed)
OPERATIVE REPORT  SURGEON: Rod Can, MD   ASSISTANT: Cherlynn June, PA-C  PREOPERATIVE DIAGNOSIS: Displaced Right femoral neck fracture.   POSTOPERATIVE DIAGNOSIS: Displaced Right femoral neck fracture.   PROCEDURE: Right total hip arthroplasty, anterior approach.   IMPLANTS: DePuy Tri Lock stem, size 9, hi offset. DePuy Pinnacle Cup, size 58 mm. DePuy Altrx liner, size 36 by 58 mm,  neutral. DePuy metal head ball, size 36 + 5 mm.  ANESTHESIA:  Spinal  ANTIBIOTICS: 2g ancef.  ESTIMATED BLOOD LOSS:-350 mL    DRAINS: None.  COMPLICATIONS: None   CONDITION: PACU - hemodynamically stable.   BRIEF CLINICAL NOTE: Andrew Pacheco is a 80 y.o. male with a displaced Right femoral neck fracture. The patient was admitted to the hospitalist service and underwent perioperative risk stratification and medical optimization. The risks, benefits, and alternatives to total hip arthroplasty were explained, and the patient elected to proceed.  PROCEDURE IN DETAIL: The patient was taken to the operating room and general anesthesia was induced on the hospital bed.  The patient was then positioned on the Hana table.  All bony prominences were well padded.  The hip was prepped and draped in the normal sterile surgical fashion.  A time-out was called verifying side and site of surgery. Antibiotics were given within 60 minutes of beginning the procedure.  The direct anterior approach to the hip was performed through the Hueter interval.  Lateral femoral circumflex vessels were treated with the Auqumantys. The anterior capsule was exposed and an inverted T capsulotomy was made.  Fracture hematoma was encountered and evacuated. The patient was found to have a comminuted Right subcapital femoral neck fracture.  I freshened the femoral neck cut with a saw.  I removed the femoral neck fragment.  A corkscrew was placed into the head and the head was removed.  This was passed to the back table and was  measured.   Acetabular exposure was achieved, and the pulvinar and labrum were excised. Sequential reaming of the acetabulum was then performed up to a size 57 mm reamer. A 58 mm cup was then opened and impacted into place at approximately 40 degrees of abduction and 20 degrees of anteversion. The final polyethylene liner was impacted into place.    I then gained femoral exposure taking care to protect the abductors and greater trochanter.  This was performed using standard external rotation, extension, and adduction.  The capsule was peeled off the inner aspect of the greater trochanter, taking care to preserve the short external rotators. A cookie cutter was used to enter the femoral canal, and then the femoral canal finder was used to confirm location.  I then sequentially broached up to a size 9.  Calcar planer was used on the femoral neck remnant.  I paced a hi neck and a trial head ball. The hip was reduced.  Leg lengths were checked fluoroscopically.  The hip was dislocated and trial components were removed.  I placed the real stem followed by the real spacer and head ball.  The hip was reduced.  Fluoroscopy was used to confirm component position and leg lengths.  At 90 degrees of external rotation and extension, the hip was stable to an anterior directed force.   The wound was copiously irrigated with Irrisept solution and normal saline using pule lavage.  Marcaine solution was injected into the periarticular soft tissue.  The wound was closed in layers using #1 Vicryl and V-Loc for the fascia, 2-0 Vicryl for the subcutaneous fat,  2-0 Monocryl for the deep dermal layer, 3-0 running Monocryl subcuticular stitch and glue for the skin.  Once the glue was fully dried, an Aquacell Ag dressing was applied.  The patient was then awakened from anesthesia and transported to the recovery room in stable condition.  Sponge, needle, and instrument counts were correct at the end of the case x2.  The patient  tolerated the procedure well and there were no known complications.  Please note that a surgical assistant was a medical necessity for this procedure to perform it in a safe and expeditious manner. Assistant was necessary to provide appropriate retraction of vital neurovascular structures, to prevent femoral fracture, and to allow for anatomic placement of the prosthesis.

## 2021-07-09 NOTE — Progress Notes (Signed)
[facility] PROGRESS NOTE   Andrew Pacheco [id number] DOB: 1941-07-23 DOA: 07/08/2021  PCP: [name], MD  Brief History/Interval Summary: 80 y.o. male with medical history significant for essential hypertension, anemia, GERD, migraine, who was brought in by EMS after sustaining a mechanical fall at home resulting in right hip pain.  Patient found to have fracture involving his right hip.  He was hospitalized for further management.  Denies any history of cardiac disease.    Consultants: Orthopedics  Procedures: None yet  Antibiotics: Anti-infectives (From admission, onward)    None       Subjective/Interval History: Patient mentions that as long as he is staying stable he does not have significant pain in his right hip.  Denies any chest pain shortness of breath.  Denies any history of cardiac disease.     Assessment/Plan:  Right hip fracture This was secondary to a mechanical fall.  Patient without any history of cardiac disease.  EKG with chronic changes.  Functionally active prior to admission.  Does not need any cardiac testing prior to surgery. Orthopedics has been consulted.  Essential hypertension Poorly controlled likely due to pain.  Noted to be on metoprolol prior to admission which is being continued.  Use as needed medications as well.  History of GERD Continue PPI  Hyperlipidemia Continue statin.  Normocytic anemia Monitor hemoglobin closely.    DVT Prophylaxis: Definitive chemoprophylaxis after surgery Code Status: Full code Family Communication: Discussed with the patient Disposition Plan: Might need to go to skilled nursing facility for short-term rehab after hip surgery  Status is: Inpatient  Remains inpatient appropriate because:Ongoing active pain requiring inpatient pain management  Dispo: The patient is from: Home              Anticipated d/c is to: SNF              Patient currently is not medically stable to d/c.    Difficult to place patient No         Medications: Scheduled:  brimonidine  1 drop Both Eyes BID   And   timolol  1 drop Both Eyes BID   docusate sodium  100 mg Oral BID   heparin  5,000 Units Subcutaneous Once   latanoprost  1 drop Both Eyes QHS   metoprolol succinate  12.5 mg Oral Daily   mupirocin ointment  1 application Nasal BID   pantoprazole  40 mg Oral Daily   prenatal multivitamin  1 tablet Oral Daily   senna  3 tablet Oral Q21 days   Continuous:  sodium chloride 75 mL/hr at 07/09/21 0352   methocarbamol (ROBAXIN) IV     DGU:[id number], HYDROcodone-acetaminophen, HYDROmorphone (DILAUDID) injection, methocarbamol **OR** methocarbamol (ROBAXIN) IV, ondansetron (ZOFRAN) IV   Objective:  Vital Signs  Vitals:   07/09/21 0045 07/09/21 0238 07/09/21 0630 07/09/21 0904  BP: (!) 175/100 (!) 177/103 (!) 170/95 (!) 173/90  Pulse: 97 90 89 86  Resp: 13 16 14 16   Temp:  98.6 F (37 C) 98.1 F (36.7 C) 98.2 F (36.8 C)  TempSrc:  Oral Oral Oral  SpO2: 95% 99% 98% 98%  Weight:  66.6 kg    Height:  5\' 9"  (1.753 m)      Intake/Output Summary (Last 24 hours) at 07/09/2021 1119 Last data filed at 07/09/2021 1000 Gross per 24 hour  Intake 459.12 ml  Output 600 ml  Net -140.88 ml   Filed Weights   07/09/21 0238  Weight: 66.6 kg  General appearance: Awake alert.  In no distress Resp: Clear to auscultation bilaterally.  Normal effort Cardio: S1-S2 is normal regular.  No S3-S4.  No rubs murmurs or bruit GI: Abdomen is soft.  Nontender nondistended.  Bowel sounds are present normal.  No masses organomegaly Extremities: No edema.   Neurologic: No focal neurological deficits.    Lab Results:  Data Reviewed: I have personally reviewed following labs and imaging studies  CBC: Recent Labs  Lab 07/08/21 2016 07/09/21 0336  WBC 6.5 9.0  NEUTROABS 4.2  --   HGB 11.9* 12.5*  HCT 37.1* 39.0  MCV 92.5 92.0  PLT 175 062    Basic Metabolic Panel: Recent  Labs  Lab 07/08/21 2016 07/09/21 0336  NA 134* 139  K 3.6 4.1  CL 100 101  CO2 23 25  GLUCOSE 98 125*  BUN 15 13  CREATININE 1.21 0.93  0.96  CALCIUM 9.3 9.7    GFR: Estimated Creatinine Clearance: 55.9 mL/min (by C-G formula based on SCr of 0.96 mg/dL).    Coagulation Profile: Recent Labs  Lab 07/08/21 2016  INR 1.0      Recent Results (from the past 240 hour(s))  Resp Panel by RT-PCR (Flu A&B, Covid) Nasopharyngeal Swab     Status: None   Collection Time: 07/08/21  9:52 PM   Specimen: Nasopharyngeal Swab; Nasopharyngeal(NP) swabs in vial transport medium  Result Value Ref Range Status   SARS Coronavirus 2 by RT PCR NEGATIVE NEGATIVE Final    Comment: (NOTE) SARS-CoV-2 target nucleic acids are NOT DETECTED.  The SARS-CoV-2 RNA is generally detectable in upper respiratory specimens during the acute phase of infection. The lowest concentration of SARS-CoV-2 viral copies this assay can detect is 138 copies/mL. A negative result does not preclude SARS-Cov-2 infection and should not be used as the sole basis for treatment or other patient management decisions. A negative result may occur with  improper specimen collection/handling, submission of specimen other than nasopharyngeal swab, presence of viral mutation(s) within the areas targeted by this assay, and inadequate number of viral copies(<138 copies/mL). A negative result must be combined with clinical observations, patient history, and epidemiological information. The expected result is Negative.  Fact Sheet for Patients:  EntrepreneurPulse.com.au  Fact Sheet for Healthcare Providers:  IncredibleEmployment.be  This test is no t yet approved or cleared by the [place] FDA and  has been authorized for detection and/or diagnosis of SARS-CoV-2 by FDA under an Emergency Use Authorization (EUA). This EUA will remain  in effect (meaning this test can be used) for the  duration of the COVID-19 declaration under Section 564(b)(1) of the Act, 21 U.S.C.section 360bbb-3(b)(1), unless the authorization is terminated  or revoked sooner.       Influenza A by PCR NEGATIVE NEGATIVE Final   Influenza B by PCR NEGATIVE NEGATIVE Final    Comment: (NOTE) The Xpert Xpress SARS-CoV-2/FLU/RSV plus assay is intended as an aid in the diagnosis of influenza from Nasopharyngeal swab specimens and should not be used as a sole basis for treatment. Nasal washings and aspirates are unacceptable for Xpert Xpress SARS-CoV-2/FLU/RSV testing.  Fact Sheet for Patients: EntrepreneurPulse.com.au  Fact Sheet for Healthcare Providers: IncredibleEmployment.be  This test is not yet approved or cleared by the [place] FDA and has been authorized for detection and/or diagnosis of SARS-CoV-2 by FDA under an Emergency Use Authorization (EUA). This EUA will remain in effect (meaning this test can be used) for the duration of the COVID-19 declaration under Section 564(b)(1)  of the Act, 21 U.S.C. section 360bbb-3(b)(1), unless the authorization is terminated or revoked.  Performed at [facility], [place] [address] [place], [place] [address]   Surgical PCR screen     Status: None   Collection Time: 07/09/21  4:36 AM   Specimen: Nasal Mucosa; Nasal Swab  Result Value Ref Range Status   MRSA, PCR NEGATIVE NEGATIVE Final   Staphylococcus aureus NEGATIVE NEGATIVE Final    Comment: (NOTE) The Xpert SA Assay (FDA approved for NASAL specimens in patients 30 years of age and older), is one component of a comprehensive surveillance program. It is not intended to diagnose infection nor to guide or monitor treatment. Performed at [facility], [place] [address], [place], [place] [address]       Radiology Studies: DG Chest 1 View  Result Date: 07/08/2021 CLINICAL DATA:  Fall EXAM: CHEST  1 VIEW COMPARISON:  09/08/2017  FINDINGS: Lungs are clear. No pneumothorax or pleural effusion. Cardiac size is within normal limits. Thoracic aorta is mildly tortuous, accentuated by poor pulmonary insufflation. Pulmonary vascularity is normal. No acute bone abnormality. IMPRESSION: No active disease. Electronically Signed   By: Fidela Salisbury MD   On: 07/08/2021 20:18   CT HEAD WO CONTRAST  Result Date: 07/08/2021 CLINICAL DATA:  [place] EXAM: CT HEAD WITHOUT CONTRAST TECHNIQUE: Contiguous axial images were obtained from the base of the skull through the vertex without intravenous contrast. COMPARISON:  09/10/2017 FINDINGS: Brain: No acute infarct or hemorrhage. Lateral ventricles and midline structures are unremarkable. No acute extra-axial fluid collections. No mass effect. Vascular: No hyperdense vessel or unexpected calcification. Skull: Normal. Negative for fracture or focal lesion. Sinuses/Orbits: No acute finding. Other: None. IMPRESSION: 1. No acute intracranial process. Electronically Signed   By: Randa Ngo M.D.   On: 07/08/2021 21:38   CT CERVICAL SPINE WO CONTRAST  Result Date: 07/08/2021 CLINICAL DATA:  [place], neck trauma EXAM: CT CERVICAL SPINE WITHOUT CONTRAST TECHNIQUE: Multidetector CT imaging of the cervical spine was performed without intravenous contrast. Multiplanar CT image reconstructions were also generated. COMPARISON:  09/10/2017 FINDINGS: Alignment: Alignment is anatomic. Skull base and vertebrae: No acute fracture. No primary bone lesion or focal pathologic process. Soft tissues and spinal canal: No prevertebral fluid or swelling. No visible canal hematoma. Disc levels: Mild diffuse cervical facet hypertrophy, asymmetric to the left. Disc spaces are relatively well preserved. Minimal symmetrical neural foraminal encroachment from C3-4 through C5-6 due to facet hypertrophy and uncovertebral hypertrophy. Upper chest: Airway is patent.  Lung apices are clear. Other: Reconstructed images demonstrate no additional  findings. IMPRESSION: 1. No acute cervical spine fracture. 2. Multilevel spondylosis and facet hypertrophy. Electronically Signed   By: Randa Ngo M.D.   On: 07/08/2021 21:40   CT Hip Right Wo Contrast  Result Date: 07/08/2021 CLINICAL DATA:  Post fall with right hip fracture. EXAM: CT OF THE RIGHT HIP WITHOUT CONTRAST TECHNIQUE: Multidetector CT imaging of the right hip was performed according to the standard protocol. Multiplanar CT image reconstructions were also generated. COMPARISON:  Radiograph earlier today. FINDINGS: Bones/Joint/Cartilage Right femoral neck fracture is impacted as well as displaced. There is apex anterior angulation. Fracture extends to the posterior aspect of the head neck junction. Femoral head remains seated. There is mild right hip joint space narrowing. Intact pubic rami. 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 No hip joint effusion. Ligaments Suboptimally assessed by CT. Muscles and Tendons Mild fatty atrophy of gluteus  medius and minimus muscle with calcification of the tendon, suggesting remote injury. No intramuscular hematoma. Soft tissues Mild soft tissue edema posterolaterally. The urinary bladder is distended. Colonic diverticulosis partially included. IMPRESSION: 1. Right femoral neck fracture is impacted as well as displaced. Fracture extends to the posterior aspect of the head neck junction. 2. 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 3. Mild fatty atrophy of gluteus medius and minimus muscle with tendinous calcification, suggesting remote injury. Electronically Signed   By: Keith Rake M.D.   On: 07/08/2021 22:10   DG Knee Complete 4 Views Right  Result Date: 07/08/2021 CLINICAL DATA:  Patient BIB [facility]. Patient fell in [place], possible right hip fx. EXAM: RIGHT KNEE - COMPLETE 4+ VIEW  COMPARISON:  None. FINDINGS: No evidence of fracture, dislocation, or joint effusion. No evidence of severe arthropathy. No aggressive appearing focal bone abnormality. Soft tissues are unremarkable. IMPRESSION: No acute displaced fracture or dislocation of the right knee. Electronically Signed   By: Iven Finn M.D.   On: 07/08/2021 20:21   DG Hip Unilat  With Pelvis 2-3 Views Right  Result Date: 07/08/2021 CLINICAL DATA:  Right hip injury, right hip pain EXAM: DG HIP (WITH OR WITHOUT PELVIS) 2-3V RIGHT COMPARISON:  None. FINDINGS: Single view radiograph pelvis and two view radiograph right hip demonstrate an acute, impacted, subcapital right femoral neck fracture. The femoral head is still seated within the right acetabulum. The right hip joint space is preserved. Limited image of the left hip is unremarkable. The pelvis is intact. IMPRESSION: Acute, impacted right subcapital femoral neck fracture. Electronically Signed   By: Fidela Salisbury MD   On: 07/08/2021 19:39       LOS: 1 day   [facility] Pager on [email or web address]  07/09/2021, 11:19 AM

## 2021-07-10 ENCOUNTER — Encounter (HOSPITAL_COMMUNITY): Payer: Self-pay | Admitting: Orthopedic Surgery

## 2021-07-10 DIAGNOSIS — I10 Essential (primary) hypertension: Secondary | ICD-10-CM | POA: Diagnosis not present

## 2021-07-10 DIAGNOSIS — S72001A Fracture of unspecified part of neck of right femur, initial encounter for closed fracture: Secondary | ICD-10-CM | POA: Diagnosis not present

## 2021-07-10 LAB — CBC
HCT: 28.1 % — ABNORMAL LOW (ref 39.0–52.0)
Hemoglobin: 9 g/dL — ABNORMAL LOW (ref 13.0–17.0)
MCH: 29.5 pg (ref 26.0–34.0)
MCHC: 32 g/dL (ref 30.0–36.0)
MCV: 92.1 fL (ref 80.0–100.0)
Platelets: 143 10*3/uL — ABNORMAL LOW (ref 150–400)
RBC: 3.05 MIL/uL — ABNORMAL LOW (ref 4.22–5.81)
RDW: 13.7 % (ref 11.5–15.5)
WBC: 13.9 10*3/uL — ABNORMAL HIGH (ref 4.0–10.5)
nRBC: 0 % (ref 0.0–0.2)

## 2021-07-10 LAB — BASIC METABOLIC PANEL
Anion gap: 5 (ref 5–15)
BUN: 21 mg/dL (ref 8–23)
CO2: 25 mmol/L (ref 22–32)
Calcium: 8.6 mg/dL — ABNORMAL LOW (ref 8.9–10.3)
Chloride: 106 mmol/L (ref 98–111)
Creatinine, Ser: 1.19 mg/dL (ref 0.61–1.24)
GFR, Estimated: >60 mL/min (ref >60)
Glucose, Bld: 117 mg/dL — ABNORMAL HIGH (ref 70–99)
Potassium: 4.5 mmol/L (ref 3.5–5.1)
Sodium: 136 mmol/L (ref 135–145)

## 2021-07-10 MED ORDER — HYDROCODONE-ACETAMINOPHEN 7.5-325 MG PO TABS: 1.0000 | ORAL_TABLET | ORAL | 0 refills | Status: AC | PRN

## 2021-07-10 MED ORDER — ASPIRIN 81 MG PO CHEW: 81.0000 mg | CHEWABLE_TABLET | Freq: Two times a day (BID) | ORAL | 0 refills | Status: AC

## 2021-07-10 MED ORDER — SODIUM CHLORIDE 0.9 % IV BOLUS
250.0000 mL | Freq: Once | INTRAVENOUS | Status: AC
  Administered 2021-07-10: 250 mL via INTRAVENOUS

## 2021-07-10 NOTE — NC FL2 (Signed)
[place] MEDICAID FL2 LEVEL OF CARE SCREENING TOOL     IDENTIFICATION  Patient Name: Andrew Pacheco Birthdate: 12-03-1941 Sex: male Admission Date (Current Location): 07/08/2021  [facility] and [place] Number:  Herbalist and Address:  [facility],  [place], [place]      Provider Number: [id number]  Attending Physician Name and Address:  Bonnielee Haff, MD  Relative Name and Phone Number:  wife, Phares Diemert @ [phone]    Current Level of Care: Hospital Recommended Level of Care: [address] Prior Approval Number:    Date Approved/Denied:   PASRR Number: [id number]  Discharge Plan: SNF    Current Diagnoses: Patient Active Problem List   Diagnosis Date Noted   Hip fracture ([place]) 07/08/2021   Vasodepressor syncope 09/11/2017   Vasovagal near syncope 09/11/2017   Low vitamin B12 level 09/10/2017   HTN (hypertension) 09/10/2017   Malnutrition of moderate degree 09/10/2017   Gastroesophageal reflux disease    AKI (acute kidney injury) ([place])    Glaucoma    Near syncope 09/08/2017   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   ANEMIA-UNSPECIFIED 09/26/2009   CONSTIPATION 09/26/2009   NAUSEA 09/26/2009   ABDOMINAL PAIN -GENERALIZED 09/26/2009    Orientation RESPIRATION BLADDER Height & Weight     Self, Time, Situation, Place  Normal Continent Weight: 146 lb 13.2 oz (66.6 kg) Height:  '5\' 9"'$  (175.3 cm)  BEHAVIORAL SYMPTOMS/MOOD NEUROLOGICAL BOWEL NUTRITION STATUS      Continent    AMBULATORY STATUS COMMUNICATION OF NEEDS Skin   Limited Assist Verbally Surgical wounds                       Personal Care Assistance Level of Assistance  Bathing, Dressing Bathing Assistance: Limited assistance   Dressing Assistance: Limited assistance     Functional Limitations Info             [place]  PT (By licensed PT), OT (By licensed OT)     PT Frequency: 5x/wk OT Frequency: 5x/wk             Contractures Contractures Info: Not present    Additional Factors Info  Code Status, Allergies Code Status Info: Full Allergies Info: NKDA           Current Medications (07/10/2021):  This is the current hospital active medication list Current Facility-Administered Medications  Medication Dose Route Frequency Provider Last Rate Last Admin   acetaminophen (TYLENOL) tablet 325-650 mg  325-650 mg Oral Q6H PRN Swinteck, Aaron Edelman, MD       brimonidine (ALPHAGAN) 0.2 % ophthalmic solution 1 drop  1 drop Both Eyes BID Swinteck, Aaron Edelman, MD   1 drop at 07/10/21 0815   And   timolol (TIMOPTIC) 0.5 % ophthalmic solution 1 drop  1 drop Both Eyes BID Rod Can, MD   1 drop at 07/10/21 0815   ceFAZolin (ANCEF) 2 g in sodium chloride 0.9 % 100 mL IVPB  2 g Intravenous On Call to OR Nicholes Stairs, MD       chlorhexidine (HIBICLENS) 4 % liquid 4 application  60 mL Topical Once Nicholes Stairs, MD       docusate sodium (COLACE) capsule 100 mg  100 mg Oral BID Rod Can, MD   100 mg at 07/10/21 0814   enoxaparin (LOVENOX) injection 40 mg  40 mg Subcutaneous Q24H Rod Can, MD   40 mg at 07/10/21 0815   feeding supplement (ENSURE  SURGERY) liquid 237 mL  237 mL Oral BID BM Rod Can, MD   237 mL at 07/10/21 0819   heparin injection 5,000 Units  5,000 Units Subcutaneous Once Clance Boll, MD       HYDROcodone-acetaminophen ([place]) 7.5-325 MG per tablet 1-2 tablet  1-2 tablet Oral Q4H PRN Rod Can, MD       HYDROcodone-acetaminophen (NORCO/VICODIN) 5-325 MG per tablet 1-2 tablet  1-2 tablet Oral Q4H PRN Rod Can, MD   2 tablet at 07/10/21 [id number]   HYDROmorphone (DILAUDID) injection 0.5 mg  0.5 mg Intravenous Q2H PRN Myles Rosenthal A, MD   0.5 mg at 07/09/21 0756   latanoprost (XALATAN) 0.005 % ophthalmic solution 1 drop  1 drop Both Eyes QHS Swinteck, Aaron Edelman, MD   1 drop at 07/09/21 2201   menthol-cetylpyridinium (CEPACOL) lozenge 3 mg  1 lozenge Oral  PRN Rod Can, MD       Or   phenol (CHLORASEPTIC) mouth spray 1 spray  1 spray Mouth/Throat PRN Swinteck, Aaron Edelman, MD       methocarbamol (ROBAXIN) tablet 500 mg  500 mg Oral Q6H PRN Rod Can, MD   500 mg at 07/09/21 1652   Or   methocarbamol (ROBAXIN) 500 mg in dextrose 5 % 50 mL IVPB  500 mg Intravenous Q6H PRN Swinteck, Aaron Edelman, MD       metoCLOPramide (REGLAN) tablet 5-10 mg  5-10 mg Oral Q8H PRN Swinteck, Aaron Edelman, MD       Or   metoCLOPramide (REGLAN) injection 5-10 mg  5-10 mg Intravenous Q8H PRN Swinteck, Aaron Edelman, MD       metoprolol succinate (TOPROL-XL) 24 hr tablet 12.5 mg  12.5 mg Oral Daily Myles Rosenthal A, MD   12.5 mg at 07/10/21 [id number]   morphine 2 MG/ML injection 0.5-1 mg  0.5-1 mg Intravenous Q2H PRN Swinteck, Aaron Edelman, MD       mupirocin ointment (BACTROBAN) 2 % 1 application  1 application Nasal BID Rod Can, MD   1 application at [id number] 0815   ondansetron (ZOFRAN) tablet 4 mg  4 mg Oral Q6H PRN Swinteck, Aaron Edelman, MD       Or   ondansetron (ZOFRAN) injection 4 mg  4 mg Intravenous Q6H PRN Swinteck, Aaron Edelman, MD       pantoprazole (PROTONIX) EC tablet 40 mg  40 mg Oral Daily Myles Rosenthal A, MD   40 mg at 07/10/21 0814   povidone-iodine 10 % swab 2 application  2 application Topical Once Nicholes Stairs, MD       prenatal multivitamin tablet 1 tablet  1 tablet Oral Daily Clance Boll, MD   1 tablet at 07/10/21 [id number]   senna (SENOKOT) tablet 25.8 mg  3 tablet Oral Q21 days Clance Boll, MD         Discharge Medications: Please see discharge summary for a list of discharge medications.  Relevant Imaging Results:  Relevant Lab Results:   Additional Information SS# [id number];  Pt is UNVACCINATED FOR COVID  Leidy Massar, LCSW

## 2021-07-10 NOTE — TOC Progression Note (Signed)
Transition of Care [facility]) - Progression Note    Patient Details  Name: Andrew Pacheco [name]: [id number] Date of Birth: [birth year]  Transition of Care [facility] CM/SW Contact  Lennart Pall, LCSW Phone Number: 07/10/2021, 2:14 PM  Clinical Narrative:     Have spoken again today with pt and son and they feel that SNF for short term rehab is best option for them as pt's wife is unable to assist at home.  Family, also, asking if CIR might be an option - will alert MD and therapies that they have asked and let them discuss further.  Will begin SNF bed search at this point.  Expected Discharge Plan: [place] Barriers to Discharge: Continued Medical Work up  Expected Discharge Plan and Services Expected Discharge Plan: [place] In-house Referral: Clinical Social Work   Post Acute Care Choice: [place] Living arrangements for the past 2 months: Single Family Home                                       Social Determinants of Health (SDOH) Interventions    Readmission Risk Interventions No flowsheet data found.

## 2021-07-10 NOTE — Evaluation (Signed)
Physical Therapy Evaluation Patient Details Name: Andrew Pacheco [name]: [id number] DOB: 1941/07/23 Today's Date: 07/10/2021   History of Present Illness  Patient is 80 y.o. male s/p Rt THA anterior approach on 07/09/21 secondary to Rt femoral neck fracture from a fall. PMh significant for OA, anemia, GERD, glaucoma, HTN, back pain, migraine.    Clinical Impression  Andrew Pacheco is a 80 y.o. male POD 1 s/p Rt THA. Patient reports independence with mobility at baseline. Patient is now limited by functional impairments (see PT problem list below) and requires min assist for bed mobility, transfers, and gait with RW. Patient was able to take several small steps forward feet with RW and min assist nut distance was limited by nausea, lightheadedness. Pt found to be orthostatic with mobility and seated rest resolved symptoms. Patient instructed in exercise to facilitate circulation to manage edema and reduce risk of DVT. Patient will benefit from continued skilled PT interventions to address impairments and progress towards PLOF. Acute PT will follow to progress mobility and stair training in preparation for safe discharge. Anticipate pt may need short term rehab at SNF due to limited physical assist available from spouse.      Follow Up Recommendations SNF    Equipment Recommendations  Rolling walker with 5" wheels;3in1 (PT)    Recommendations for Other Services       Precautions / Restrictions Precautions Precautions: Fall Restrictions Weight Bearing Restrictions: No Other Position/Activity Restrictions: WBAT      Mobility  Bed Mobility Overal bed mobility: Needs Assistance Bed Mobility: Supine to Sit     Supine to sit: Min assist;HOB elevated     General bed mobility comments: cues for use of bed rail and assist to bring LE's off EOB. pt able to scoot to edge.    Transfers Overall transfer level: Needs assistance Equipment used: Rolling walker (2 wheeled) Transfers: Sit to/from  [facility] Sit to Stand: Min assist;From elevated surface Stand pivot transfers: Min assist;From elevated surface       General transfer comment: cues for hand placement for technique with power up to RW from EOB. min assist to steady with rise. Min assist to pivot after several forward steps due to nausea and light headed sensation. symptoms resolved in sitting and BP 100/62 mmHg. Pt completed additional stand for orthostatic vitals and found to be hypotensive at 82/46.  Ambulation/Gait Ambulation/Gait assistance: Min assist Gait Distance (Feet): 3 Feet Assistive device: Rolling walker (2 wheeled) Gait Pattern/deviations: Step-to pattern;Decreased step length - left;Decreased weight shift to right     General Gait Details: cuse for step to patterna nd proximity to RW, no overt LOB but ditance limited by nausea/lightheaded sensation. pt pivoted to chair.  Stairs            Wheelchair Mobility    Modified Rankin (Stroke Patients Only)       Balance Overall balance assessment: Needs assistance Sitting-balance support: Feet supported Sitting balance-Leahy Scale: Fair     Standing balance support: During functional activity;Bilateral upper extremity supported Standing balance-Leahy Scale: Poor                               Pertinent Vitals/Pain Pain Assessment: 0-10 Pain Score: 0-No pain Pain Location: Rt hip Pain Descriptors / Indicators: Aching;Discomfort;Guarding Pain Intervention(s): Monitored during session;Repositioned;Ice applied    Home Living Family/patient expects to be discharged to:: Private residence Living Arrangements: Spouse/significant other Available Help at Discharge:  Family Type of Home: House Home Access: Stairs to enter Entrance Stairs-Rails: None Entrance Stairs-Number of Steps: 1 Home Layout: One level Home Equipment: Cane - single point;Walker - 2 wheels (old walker)      Prior Function Level of  Independence: Independent               Hand Dominance   Dominant Hand: Right    Extremity/Trunk Assessment   Upper Extremity Assessment Upper Extremity Assessment: Defer to OT evaluation    Lower Extremity Assessment Lower Extremity Assessment: Generalized weakness    Cervical / Trunk Assessment Cervical / Trunk Assessment: Kyphotic  Communication   Communication: HOH (wife bringing hearing aids)  Cognition Arousal/Alertness: Awake/alert Behavior During Therapy: WFL for tasks assessed/performed Overall Cognitive Status: Within Functional Limits for tasks assessed                                        General Comments      Exercises Total Joint Exercises Ankle Circles/Pumps: AROM;Both;20 reps;Seated   Assessment/Plan    PT Assessment Patient needs continued PT services  PT Problem List Decreased strength;Decreased range of motion;Decreased activity tolerance;Decreased balance;Decreased mobility;Decreased knowledge of use of DME;Decreased safety awareness;Decreased knowledge of precautions;Pain       PT Treatment Interventions DME instruction;Gait training;Stair training;Functional mobility training;Therapeutic activities;Therapeutic exercise;Balance training;Neuromuscular re-education;Patient/family education    PT Goals (Current goals can be found in the Care Plan section)  Acute Rehab PT Goals Patient Stated Goal: regain independence and strength PT Goal Formulation: With patient Time For Goal Achievement: 07/17/21 Potential to Achieve Goals: Good    Frequency Min 5X/week (home vs SNF)   Barriers to discharge        Co-evaluation PT/OT/SLP Co-Evaluation/Treatment:  (dovetail with OT)             AM-PAC PT "6 Clicks" Mobility  Outcome Measure Help needed turning from your back to your side while in a flat bed without using bedrails?: A Little Help needed moving from lying on your back to sitting on the side of a flat bed  without using bedrails?: A Little Help needed moving to and from a bed to a chair (including a wheelchair)?: A Little Help needed standing up from a chair using your arms (e.g., wheelchair or bedside chair)?: A Little Help needed to walk in hospital room?: A Little Help needed climbing 3-5 steps with a railing? : A Lot 6 Click Score: 17    End of Session Equipment Utilized During Treatment: Gait belt Activity Tolerance: Patient tolerated treatment well;Treatment limited secondary to medical complications (Comment) (orthostatic hypotension) Patient left: in chair;with call bell/phone within reach;with chair alarm set (OT in room) Nurse Communication: Mobility status PT Visit Diagnosis: Muscle weakness (generalized) (M62.81);Difficulty in walking, not elsewhere classified (R26.2);Other symptoms and signs involving the nervous system (R29.898)    Time: [id number] PT Time Calculation (min) (ACUTE ONLY): 27 min   Charges:   PT Evaluation $PT Eval Low Complexity: 1 Low PT Treatments $Therapeutic Activity: 8-22 mins        Verner Mould, DPT Acute Rehabilitation Services Office [phone] Pager [phone]   Jacques Navy 07/10/2021, 11:53 AM

## 2021-07-10 NOTE — Evaluation (Signed)
Occupational Therapy Evaluation Patient Details Name: Andrew Pacheco [name]: [id number] DOB: Jun 29, 1941 Today's Date: 07/10/2021    History of Present Illness Patient is 80 y.o. male s/p Rt THA anterior approach on 07/09/21 secondary to Rt femoral neck fracture from a fall. PMh significant for OA, anemia, GERD, glaucoma, HTN, back pain, migraine.   Clinical Impression   Patient is a pleasant 80 year old male who was admitted for above diagnosis. Patient was previously living at home with independence in ADLs. Currently, patient is TD for LB dressing and bathing tasks with min A for transfer with RW. Patient was noted to be symptomatic of drop in blood pressure with transfers on this date. Patient seemed to have symptoms subside with resting in chair. Patient was educated on importance of sitting up out of chair during the day. Patient was noted to have increased pain, decreased standing balance, decreased endurance, decreased functional activity tolerance, and changes in blood pressure impacting patients ability to complete ADLs. Patient would continue to benefit from skilled OT services at this time while admitted and after d/c to address noted deficits in order to improve overall safety and independence in ADLs.      Follow Up Recommendations  SNF    Equipment Recommendations  3 in 1 bedside commode;Tub/shower seat    Recommendations for Other Services       Precautions / Restrictions Precautions Precautions: Fall Restrictions Weight Bearing Restrictions: No Other Position/Activity Restrictions: WBAT      Mobility Bed Mobility Overal bed mobility: Needs Assistance Bed Mobility: Supine to Sit     Supine to sit: Min assist;HOB elevated     General bed mobility comments: cues for use of bed rail and assist to bring LE's off EOB. pt able to scoot to edge.    Transfers Overall transfer level: Needs assistance Equipment used: Rolling walker (2 wheeled) Transfers: Sit to/from  [facility] Sit to Stand: Min assist;From elevated surface Stand pivot transfers: Min assist;From elevated surface       General transfer comment: cues for hand placement for technique with power up to RW from EOB. min assist to steady with rise. Min assist to pivot after several forward steps due to nausea and light headed sensation. symptoms resolved in sitting and BP 100/62 mmHg. Pt completed additional stand for orthostatic vitals and found to be hypotensive at 82/46.    Balance Overall balance assessment: Needs assistance Sitting-balance support: Feet supported Sitting balance-Leahy Scale: Fair     Standing balance support: During functional activity;Bilateral upper extremity supported Standing balance-Leahy Scale: Poor                             ADL either performed or assessed with clinical judgement   ADL Overall ADL's : Needs assistance/impaired     Grooming: Wash/dry face;Oral care;Set up;Sitting   Upper Body Bathing: Minimal assistance;Sitting   Lower Body Bathing: Moderate assistance;Sitting/lateral leans   Upper Body Dressing : Minimal assistance;Sitting   Lower Body Dressing: Maximal assistance;Sitting/lateral leans   Toilet Transfer: Minimal assistance;RW Toilet Transfer Details (indicate cue type and reason): patient was noted to have drop in BP with standing and noted dizziness. Toileting- Clothing Manipulation and Hygiene: Maximal assistance       Functional mobility during ADLs: Minimal assistance;Rolling walker       Vision         Perception     Praxis      Pertinent Vitals/Pain Pain  Assessment: 0-10 Pain Score: 0-No pain Pain Location: Rt hip Pain Descriptors / Indicators: Aching;Discomfort;Guarding Pain Intervention(s): Monitored during session     Hand Dominance Right   Extremity/Trunk Assessment Upper Extremity Assessment Upper Extremity Assessment: Overall WFL for tasks assessed   Lower Extremity  Assessment Lower Extremity Assessment: Generalized weakness   Cervical / Trunk Assessment Cervical / Trunk Assessment: Kyphotic   Communication Communication Communication: HOH   Cognition Arousal/Alertness: Awake/alert Behavior During Therapy: WFL for tasks assessed/performed Overall Cognitive Status: Within Functional Limits for tasks assessed                                     General Comments       Exercises Exercises: Total Joint Total Joint Exercises Ankle Circles/Pumps: AROM;Both;20 reps;Seated   Shoulder Instructions      Home Living Family/patient expects to be discharged to:: Private residence Living Arrangements: Spouse/significant other Available Help at Discharge: Family Type of Home: House Home Access: Stairs to enter Technical brewer of Steps: 1 Entrance Stairs-Rails: None Home Layout: One level     Bathroom Shower/Tub: Teacher, early years/pre: Standard Bathroom Accessibility: Yes   Home Equipment: Cane - single point;Walker - 2 wheels          Prior Functioning/Environment Level of Independence: Independent                 OT Problem List: Decreased strength;Impaired balance (sitting and/or standing);Pain;Decreased range of motion;Decreased activity tolerance;Decreased coordination;Decreased knowledge of use of DME or AE      OT Treatment/Interventions: Self-care/ADL training;DME and/or AE instruction;Therapeutic activities;Balance training;Therapeutic exercise;Patient/family education    OT Goals(Current goals can be found in the care plan section) Acute Rehab OT Goals Patient Stated Goal: regain independence and strength OT Goal Formulation: With patient Time For Goal Achievement: 07/24/21 Potential to Achieve Goals: Good  OT Frequency: Min 2X/week   Barriers to D/C:    patient to d/chome with wife who is unable to offer physical assistance.       Co-evaluation PT/OT/SLP Co-Evaluation/Treatment:  Yes Reason for Co-Treatment: To address functional/ADL transfers PT goals addressed during session: Mobility/safety with mobility OT goals addressed during session: ADL's and self-care      AM-PAC OT "6 Clicks" Daily Activity     Outcome Measure Help from another person eating meals?: None Help from another person taking care of personal grooming?: A Little Help from another person toileting, which includes using toliet, bedpan, or urinal?: A Lot Help from another person bathing (including washing, rinsing, drying)?: A Lot Help from another person to put on and taking off regular upper body clothing?: A Lot Help from another person to put on and taking off regular lower body clothing?: A Lot 6 Click Score: 15   End of Session Equipment Utilized During Treatment: Gait belt;Rolling walker Nurse Communication: Mobility status  Activity Tolerance: Other (comment) (limited by BP dropping and symptoms associated.) Patient left: in chair;with call bell/phone within reach;with chair alarm set  OT Visit Diagnosis: Muscle weakness (generalized) (M62.81);Pain Pain - Right/Left: Right Pain - part of body: Hip                Time: 1049-1100 OT Time Calculation (min): 11 min Charges:  OT General Charges $OT Visit: 1 Visit OT Evaluation $OT Eval Low Complexity: 1 Low  Leota Sauers, MS Acute Rehabilitation Department Office# [phone] Pager# [phone]  07/10/2021, 1:28 PM

## 2021-07-10 NOTE — Progress Notes (Signed)
[facility] PROGRESS NOTE   FERNADO ZALDIVAR [id number] DOB: 08/27/1941 DOA: 07/08/2021  PCP: [name], MD  Brief History/Interval Summary: 80 y.o. male with medical history significant for essential hypertension, anemia, GERD, migraine, who was brought in by EMS after sustaining a mechanical fall at home resulting in right hip pain.  Patient found to have fracture involving his right hip.  He was hospitalized for further management.  Denies any history of cardiac disease.    Consultants: Orthopedics  Procedures: Right total hip arthroplasty, anterior approach 7/21  Antibiotics: Anti-infectives (From admission, onward)    Start     Dose/Rate Route Frequency Ordered Stop   07/10/21 0600  ceFAZolin (ANCEF) 2 g in sodium chloride 0.9 % 100 mL IVPB        2 g 200 mL/hr over 30 Minutes Intravenous On call to O.R. 07/09/21 1600 07/11/21 0559   07/09/21 1900  ceFAZolin (ANCEF) 2 g in sodium chloride 0.9 % 100 mL IVPB        2 g 200 mL/hr over 30 Minutes Intravenous Every 6 hours 07/09/21 1559 07/10/21 0207   07/09/21 1159  sodium chloride 0.9 % with ceFAZolin (ANCEF) ADS Med       Note to Pharmacy: Charmayne Sheer   : cabinet override      07/09/21 1159 07/10/21 0014       Subjective/Interval History: Patient mentions that he is feeling better.  Denies any significant pain in his right hip area.  No shortness of breath or chest pain.  No nausea or vomiting     Assessment/Plan:  Right hip fracture This was secondary to a mechanical fall.  Patient was seen by orthopedics.  Underwent total right hip arthroplasty on 7/21.  Seems to be doing well postoperatively.    Postoperative anemia/acute blood loss anemia Drop in hemoglobin noted.  No overt bruising noted over the right thigh area.  Some swelling is present.  We will recheck hemoglobin tomorrow.  Transfuse if it drops less than 7.  Essential hypertension Elevated blood pressure yesterday was likely due to pain  from his right hip fracture.  Continue metoprolol.  Blood pressure noted to be much improved today.    History of GERD Continue PPI  Hyperlipidemia Continue statin.     DVT Prophylaxis: Lovenox Code Status: Full code Family Communication: Discussed with the patient Disposition Plan: Await PT and OT evaluation.  Might need to go to skilled nursing facility for short-term rehab after hip surgery  Status is: Inpatient  Remains inpatient appropriate because:Ongoing active pain requiring inpatient pain management  Dispo: The patient is from: Home              Anticipated d/c is to: SNF              Patient currently is not medically stable to d/c.   Difficult to place patient No         Medications: Scheduled:  brimonidine  1 drop Both Eyes BID   And   timolol  1 drop Both Eyes BID    ceFAZolin (ANCEF) IV  2 g Intravenous On Call to OR   chlorhexidine  60 mL Topical Once   docusate sodium  100 mg Oral BID   enoxaparin (LOVENOX) injection  40 mg Subcutaneous Q24H   feeding supplement  237 mL Oral BID BM   heparin  5,000 Units Subcutaneous Once   latanoprost  1 drop Both Eyes QHS   metoprolol succinate  12.5 mg  Oral Daily   mupirocin ointment  1 application Nasal BID   pantoprazole  40 mg Oral Daily   povidone-iodine  2 application Topical Once   prenatal multivitamin  1 tablet Oral Daily   senna  3 tablet Oral Q21 days   Continuous:  methocarbamol (ROBAXIN) IV     [id number], hydrALAZINE, HYDROcodone-acetaminophen, HYDROcodone-acetaminophen, HYDROmorphone (DILAUDID) injection, menthol-cetylpyridinium **OR** phenol, methocarbamol **OR** methocarbamol (ROBAXIN) IV, metoCLOPramide **OR** metoCLOPramide (REGLAN) injection, morphine injection, ondansetron **OR** ondansetron (ZOFRAN) IV   Objective:  Vital Signs  Vitals:   07/09/21 2107 07/10/21 0110 07/10/21 0517 07/10/21 0919  BP: 129/70 128/73 123/72 117/63  Pulse: 79 76 76 85  Resp: '18 16 16 16  '$ Temp:  98.7 F (37.1 C) 98.5 F (36.9 C) 97.9 F (36.6 C) 98 F (36.7 C)  TempSrc: Oral Oral Oral Oral  SpO2: 96% 95% 96% 97%  Weight:      Height:        Intake/Output Summary (Last 24 hours) at 07/10/2021 1009 Last data filed at 07/10/2021 0600 Gross per 24 hour  Intake 1940 ml  Output 1700 ml  Net 240 ml    Filed Weights   07/09/21 0238  Weight: 66.6 kg    General appearance: Awake alert.  In no distress Resp: Clear to auscultation bilaterally.  Normal effort Cardio: S1-S2 is normal regular.  No S3-S4.  No rubs murmurs or bruit GI: Abdomen is soft.  Nontender nondistended.  Bowel sounds are present normal.  No masses organomegaly Extremities: No edema.  No bruising noted over the right hip area.  Swelling is present.  Able to move his lower extremities. Neurologic: Alert and oriented x3.  No focal neurological deficits.     Lab Results:  Data Reviewed: I have personally reviewed following labs and imaging studies  CBC: Recent Labs  Lab 07/08/21 2016 07/09/21 0336 07/10/21 0309  WBC 6.5 9.0 13.9*  NEUTROABS 4.2  --   --   HGB 11.9* 12.5* 9.0*  HCT 37.1* 39.0 28.1*  MCV 92.5 92.0 92.1  PLT 175 177 143*     Basic Metabolic Panel: Recent Labs  Lab 07/08/21 2016 07/09/21 0336 07/10/21 0309  NA 134* 139 136  K 3.6 4.1 4.5  CL 100 101 106  CO2 '23 25 25  '$ GLUCOSE 98 125* 117*  BUN '15 13 21  '$ CREATININE 1.21 0.93  0.96 1.19  CALCIUM 9.3 9.7 8.6*     GFR: Estimated Creatinine Clearance: 45.1 mL/min (by C-G formula based on SCr of 1.19 mg/dL).    Coagulation Profile: Recent Labs  Lab 07/08/21 2016  INR 1.0       Recent Results (from the past 240 hour(s))  Resp Panel by RT-PCR (Flu A&B, Covid) Nasopharyngeal Swab     Status: None   Collection Time: 07/08/21  9:52 PM   Specimen: Nasopharyngeal Swab; Nasopharyngeal(NP) swabs in vial transport medium  Result Value Ref Range Status   SARS Coronavirus 2 by RT PCR NEGATIVE NEGATIVE Final    Comment:  (NOTE) SARS-CoV-2 target nucleic acids are NOT DETECTED.  The SARS-CoV-2 RNA is generally detectable in upper respiratory specimens during the acute phase of infection. The lowest concentration of SARS-CoV-2 viral copies this assay can detect is 138 copies/mL. A negative result does not preclude SARS-Cov-2 infection and should not be used as the sole basis for treatment or other patient management decisions. A negative result may occur with  improper specimen collection/handling, submission of specimen other than nasopharyngeal swab, presence of viral  mutation(s) within the areas targeted by this assay, and inadequate number of viral copies(<138 copies/mL). A negative result must be combined with clinical observations, patient history, and epidemiological information. The expected result is Negative.  Fact Sheet for Patients:  EntrepreneurPulse.com.au  Fact Sheet for Healthcare Providers:  IncredibleEmployment.be  This test is no t yet approved or cleared by the [place] FDA and  has been authorized for detection and/or diagnosis of SARS-CoV-2 by FDA under an Emergency Use Authorization (EUA). This EUA will remain  in effect (meaning this test can be used) for the duration of the COVID-19 declaration under Section 564(b)(1) of the Act, 21 U.S.C.section 360bbb-3(b)(1), unless the authorization is terminated  or revoked sooner.       Influenza A by PCR NEGATIVE NEGATIVE Final   Influenza B by PCR NEGATIVE NEGATIVE Final    Comment: (NOTE) The Xpert Xpress SARS-CoV-2/FLU/RSV plus assay is intended as an aid in the diagnosis of influenza from Nasopharyngeal swab specimens and should not be used as a sole basis for treatment. Nasal washings and aspirates are unacceptable for Xpert Xpress SARS-CoV-2/FLU/RSV testing.  Fact Sheet for Patients: EntrepreneurPulse.com.au  Fact Sheet for Healthcare  Providers: IncredibleEmployment.be  This test is not yet approved or cleared by the [place] FDA and has been authorized for detection and/or diagnosis of SARS-CoV-2 by FDA under an Emergency Use Authorization (EUA). This EUA will remain in effect (meaning this test can be used) for the duration of the COVID-19 declaration under Section 564(b)(1) of the Act, 21 U.S.C. section 360bbb-3(b)(1), unless the authorization is terminated or revoked.  Performed at [facility], [place] [address] [place], [place] [address]   Surgical PCR screen     Status: None   Collection Time: 07/09/21  4:36 AM   Specimen: Nasal Mucosa; Nasal Swab  Result Value Ref Range Status   MRSA, PCR NEGATIVE NEGATIVE Final   Staphylococcus aureus NEGATIVE NEGATIVE Final    Comment: (NOTE) The Xpert SA Assay (FDA approved for NASAL specimens in patients 19 years of age and older), is one component of a comprehensive surveillance program. It is not intended to diagnose infection nor to guide or monitor treatment. Performed at [facility], [place] [address], [place], [place] [address]        Radiology Studies: DG Chest 1 View  Result Date: 07/08/2021 CLINICAL DATA:  Fall EXAM: CHEST  1 VIEW COMPARISON:  09/08/2017 FINDINGS: Lungs are clear. No pneumothorax or pleural effusion. Cardiac size is within normal limits. Thoracic aorta is mildly tortuous, accentuated by poor pulmonary insufflation. Pulmonary vascularity is normal. No acute bone abnormality. IMPRESSION: No active disease. Electronically Signed   By: Fidela Salisbury MD   On: 07/08/2021 20:18   CT HEAD WO CONTRAST  Result Date: 07/08/2021 CLINICAL DATA:  [place] EXAM: CT HEAD WITHOUT CONTRAST TECHNIQUE: Contiguous axial images were obtained from the base of the skull through the vertex without intravenous contrast. COMPARISON:  09/10/2017 FINDINGS: Brain: No acute infarct or hemorrhage. Lateral ventricles and midline  structures are unremarkable. No acute extra-axial fluid collections. No mass effect. Vascular: No hyperdense vessel or unexpected calcification. Skull: Normal. Negative for fracture or focal lesion. Sinuses/Orbits: No acute finding. Other: None. IMPRESSION: 1. No acute intracranial process. Electronically Signed   By: Randa Ngo M.D.   On: 07/08/2021 21:38   CT CERVICAL SPINE WO CONTRAST  Result Date: 07/08/2021 CLINICAL DATA:  [place], neck trauma EXAM: CT CERVICAL SPINE WITHOUT CONTRAST TECHNIQUE: Multidetector CT imaging of  the cervical spine was performed without intravenous contrast. Multiplanar CT image reconstructions were also generated. COMPARISON:  09/10/2017 FINDINGS: Alignment: Alignment is anatomic. Skull base and vertebrae: No acute fracture. No primary bone lesion or focal pathologic process. Soft tissues and spinal canal: No prevertebral fluid or swelling. No visible canal hematoma. Disc levels: Mild diffuse cervical facet hypertrophy, asymmetric to the left. Disc spaces are relatively well preserved. Minimal symmetrical neural foraminal encroachment from C3-4 through C5-6 due to facet hypertrophy and uncovertebral hypertrophy. Upper chest: Airway is patent.  Lung apices are clear. Other: Reconstructed images demonstrate no additional findings. IMPRESSION: 1. No acute cervical spine fracture. 2. Multilevel spondylosis and facet hypertrophy. Electronically Signed   By: Randa Ngo M.D.   On: 07/08/2021 21:40   Pelvis Portable  Result Date: 07/09/2021 CLINICAL DATA:  Postop right hip replacement EXAM: PORTABLE PELVIS 1-2 VIEWS COMPARISON:  Preoperative imaging. FINDINGS: Right hip arthroplasty in expected alignment. No periprosthetic lucency or fracture. Recent postsurgical change includes air and edema in the soft tissues. IMPRESSION: Right hip arthroplasty without immediate postoperative complication. Electronically Signed   By: Keith Rake M.D.   On: 07/09/2021 15:39   CT Hip  Right Wo Contrast  Result Date: 07/08/2021 CLINICAL DATA:  Post fall with right hip fracture. EXAM: CT OF THE RIGHT HIP WITHOUT CONTRAST TECHNIQUE: Multidetector CT imaging of the right hip was performed according to the standard protocol. Multiplanar CT image reconstructions were also generated. COMPARISON:  Radiograph earlier today. FINDINGS: Bones/Joint/Cartilage Right femoral neck fracture is impacted as well as displaced. There is apex anterior angulation. Fracture extends to the posterior aspect of the head neck junction. Femoral head remains seated. There is mild right hip joint space narrowing. Intact pubic rami. 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 No hip joint effusion. Ligaments Suboptimally assessed by CT. Muscles and Tendons Mild fatty atrophy of gluteus medius and minimus muscle with calcification of the tendon, suggesting remote injury. No intramuscular hematoma. Soft tissues Mild soft tissue edema posterolaterally. The urinary bladder is distended. Colonic diverticulosis partially included. IMPRESSION: 1. Right femoral neck fracture is impacted as well as displaced. Fracture extends to the posterior aspect of the head neck junction. 2. 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 3. Mild fatty atrophy of gluteus medius and minimus muscle with tendinous calcification, suggesting remote injury. Electronically Signed   By: Keith Rake M.D.   On: 07/08/2021 22:10   DG Knee Complete 4 Views Right  Result Date: 07/08/2021 CLINICAL DATA:  Patient BIB [facility]. Patient fell in [place], possible right hip fx. EXAM: RIGHT KNEE - COMPLETE 4+ VIEW COMPARISON:  None. FINDINGS: No evidence of fracture, dislocation, or joint effusion. No evidence of severe arthropathy. No aggressive appearing focal bone abnormality. Soft tissues are unremarkable.  IMPRESSION: No acute displaced fracture or dislocation of the right knee. Electronically Signed   By: Iven Finn M.D.   On: 07/08/2021 20:21   DG C-Arm 1-60 Min-No Report  Result Date: 07/09/2021 Fluoroscopy was utilized by the requesting physician.  No radiographic interpretation.   DG HIP OPERATIVE UNILAT W OR W/O PELVIS RIGHT  Result Date: 07/09/2021 CLINICAL DATA:  Right anterior hip replaced. EXAM: OPERATIVE RIGHT HIP (WITH PELVIS IF PERFORMED) TECHNIQUE: Fluoroscopic spot image(s) were submitted for interpretation post-operatively. COMPARISON:  None. FINDINGS: Eight fluoroscopic spot views of the pelvis and right hip obtained in the operating room. Interval right hip arthroplasty. Fluoroscopy time 12  seconds. IMPRESSION: Procedural fluoroscopy for right hip arthroplasty Electronically Signed   By: Keith Rake M.D.   On: 07/09/2021 14:42   DG Hip Unilat  With Pelvis 2-3 Views Right  Result Date: 07/08/2021 CLINICAL DATA:  Right hip injury, right hip pain EXAM: DG HIP (WITH OR WITHOUT PELVIS) 2-3V RIGHT COMPARISON:  None. FINDINGS: Single view radiograph pelvis and two view radiograph right hip demonstrate an acute, impacted, subcapital right femoral neck fracture. The femoral head is still seated within the right acetabulum. The right hip joint space is preserved. Limited image of the left hip is unremarkable. The pelvis is intact. IMPRESSION: Acute, impacted right subcapital femoral neck fracture. Electronically Signed   By: Fidela Salisbury MD   On: 07/08/2021 19:39       LOS: 2 days   [facility] Pager on [email or web address]  07/10/2021, 10:09 AM

## 2021-07-10 NOTE — Progress Notes (Signed)
    Subjective:  Patient reports pain as mild to moderate.  Denies N/V/CP/SOB.   Objective:   VITALS:   Vitals:   07/10/21 0110 07/10/21 0517 07/10/21 0919 07/10/21 1316  BP: 128/73 123/72 117/63 (!) 102/58  Pulse: 76 76 85 81  Resp: '16 16 16 15  '$ Temp: 98.5 F (36.9 C) 97.9 F (36.6 C) 98 F (36.7 C) 97.9 F (36.6 C)  TempSrc: Oral Oral Oral Oral  SpO2: 95% 96% 97% 98%  Weight:      Height:        NAD ABD soft Neurovascular intact Sensation intact distally Intact pulses distally Dorsiflexion/Plantar flexion intact Incision: dressing C/D/I   Lab Results  Component Value Date   WBC 13.9 (H) 07/10/2021   HGB 9.0 (L) 07/10/2021   HCT 28.1 (L) 07/10/2021   MCV 92.1 07/10/2021   PLT 143 (L) 07/10/2021   BMET    Component Value Date/Time   NA 136 07/10/2021 0309   K 4.5 07/10/2021 0309   CL 106 07/10/2021 0309   CO2 25 07/10/2021 0309   GLUCOSE 117 (H) 07/10/2021 0309   BUN 21 07/10/2021 0309   CREATININE 1.19 07/10/2021 0309   CALCIUM 8.6 (L) 07/10/2021 0309   GFRNONAA >60 07/10/2021 0309   GFRAA >60 01/15/2018 0421     Assessment/Plan: 1 Day Post-Op   Active Problems:   Hip fracture (HCC)   WBAT with walker DVT ppx: Aspirin, SCDs, TEDS PO pain control PT/OT ABLA: HgB 9.0 today. Treat per hospitalist recommendations Dispo: Pending. Follow up with Dr.Swinteck in 2 weeks for radiographs     Dorothyann Peng 07/10/2021, 2:08 PM [facility] is now [employer] [address] [place], [place], [place] [address] Phone: [phone] [email or web address] Facebook  [employer]

## 2021-07-11 DIAGNOSIS — I10 Essential (primary) hypertension: Secondary | ICD-10-CM | POA: Diagnosis not present

## 2021-07-11 DIAGNOSIS — S72001A Fracture of unspecified part of neck of right femur, initial encounter for closed fracture: Secondary | ICD-10-CM | POA: Diagnosis not present

## 2021-07-11 LAB — CBC
HCT: 26.5 % — ABNORMAL LOW (ref 39.0–52.0)
Hemoglobin: 8.4 g/dL — ABNORMAL LOW (ref 13.0–17.0)
MCH: 29.7 pg (ref 26.0–34.0)
MCHC: 31.7 g/dL (ref 30.0–36.0)
MCV: 93.6 fL (ref 80.0–100.0)
Platelets: 140 10*3/uL — ABNORMAL LOW (ref 150–400)
RBC: 2.83 MIL/uL — ABNORMAL LOW (ref 4.22–5.81)
RDW: 13.9 % (ref 11.5–15.5)
WBC: 14.1 10*3/uL — ABNORMAL HIGH (ref 4.0–10.5)
nRBC: 0 % (ref 0.0–0.2)

## 2021-07-11 LAB — BASIC METABOLIC PANEL
Anion gap: 5 (ref 5–15)
BUN: 28 mg/dL — ABNORMAL HIGH (ref 8–23)
CO2: 24 mmol/L (ref 22–32)
Calcium: 8.4 mg/dL — ABNORMAL LOW (ref 8.9–10.3)
Chloride: 107 mmol/L (ref 98–111)
Creatinine, Ser: 1.35 mg/dL — ABNORMAL HIGH (ref 0.61–1.24)
GFR, Estimated: 52 mL/min — ABNORMAL LOW (ref >60)
Glucose, Bld: 95 mg/dL (ref 70–99)
Potassium: 4.6 mmol/L (ref 3.5–5.1)
Sodium: 136 mmol/L (ref 135–145)

## 2021-07-11 MED ORDER — SODIUM CHLORIDE 0.9 % IV SOLN: INTRAVENOUS | Status: AC

## 2021-07-11 NOTE — Progress Notes (Signed)
Physical Therapy Treatment Patient Details Name: Andrew Pacheco [name]: [id number] DOB: 22-Jan-1941 Today's Date: 07/11/2021    History of Present Illness Patient is 80 y.o. male s/p Rt THA anterior approach on 07/09/21 secondary to Rt femoral neck fracture from a fall. PMh significant for OA, anemia, GERD, glaucoma, HTN, back pain, migraine.    PT Comments    Pt very cooperative this am and motivated to progress but limited by nausea - RN aware.   Follow Up Recommendations  SNF     Equipment Recommendations  Rolling walker with 5" wheels;3in1 (PT)    Recommendations for Other Services       Precautions / Restrictions Precautions Precautions: Fall Restrictions Weight Bearing Restrictions: No RLE Weight Bearing: Weight bearing as tolerated Other Position/Activity Restrictions: WBAT    Mobility  Bed Mobility Overal bed mobility: Needs Assistance Bed Mobility: Supine to Sit     Supine to sit: Min assist;HOB elevated     General bed mobility comments: cues for sequence and use of L LE to self assist    Transfers Overall transfer level: Needs assistance Equipment used: Rolling walker (2 wheeled) Transfers: Sit to/from Stand Sit to Stand: Min assist;From elevated surface Stand pivot transfers: Min assist;From elevated surface       General transfer comment: cues for LE management and use of UEs to self assist  Ambulation/Gait Ambulation/Gait assistance: Min assist Gait Distance (Feet): 5 Feet Assistive device: Rolling walker (2 wheeled) Gait Pattern/deviations: Step-to pattern;Decreased step length - left;Decreased weight shift to right Gait velocity: decr   General Gait Details: cuse for step to patterna nd proximity to RW, no overt LOB but ditance limited by nause. Chair pulled up and pt moved to sitting - RN aware.   Stairs             Wheelchair Mobility    Modified Rankin (Stroke Patients Only)       Balance Overall balance assessment: Needs  assistance Sitting-balance support: Feet supported Sitting balance-Leahy Scale: Good     Standing balance support: During functional activity;Bilateral upper extremity supported Standing balance-Leahy Scale: Poor                              Cognition Arousal/Alertness: Awake/alert Behavior During Therapy: WFL for tasks assessed/performed Overall Cognitive Status: Within Functional Limits for tasks assessed                                        Exercises Total Joint Exercises Ankle Circles/Pumps: AROM;Both;20 reps;Seated Quad Sets: AROM;Both;10 reps;Supine Heel Slides: AAROM;Right;20 reps;Supine Hip ABduction/ADduction: AAROM;Right;15 reps;Supine    General Comments        Pertinent Vitals/Pain Pain Assessment: 0-10 Pain Score: 4  Pain Location: Rt hip Pain Descriptors / Indicators: Aching;Discomfort;Guarding Pain Intervention(s): Limited activity within patient's tolerance;Monitored during session;Premedicated before session;Ice applied    Home Living                      Prior Function            PT Goals (current goals can now be found in the care plan section) Acute Rehab PT Goals Patient Stated Goal: regain independence and strength PT Goal Formulation: With patient Time For Goal Achievement: 07/17/21 Potential to Achieve Goals: Good Progress towards PT goals: Progressing toward goals    Frequency  Min 5X/week      PT Plan Current plan remains appropriate    Co-evaluation              AM-PAC PT "6 Clicks" Mobility   Outcome Measure  Help needed turning from your back to your side while in a flat bed without using bedrails?: A Little Help needed moving from lying on your back to sitting on the side of a flat bed without using bedrails?: A Little Help needed moving to and from a bed to a chair (including a wheelchair)?: A Little Help needed standing up from a chair using your arms (e.g., wheelchair or  bedside chair)?: A Little Help needed to walk in hospital room?: A Little Help needed climbing 3-5 steps with a railing? : A Lot 6 Click Score: 17    End of Session Equipment Utilized During Treatment: Gait belt Activity Tolerance: Patient tolerated treatment well;Treatment limited secondary to medical complications (Comment) Patient left: in chair;with call bell/phone within reach;with chair alarm set Nurse Communication: Mobility status PT Visit Diagnosis: Muscle weakness (generalized) (M62.81);Difficulty in walking, not elsewhere classified (R26.2);Other symptoms and signs involving the nervous system (R29.898)     Time: [id number] PT Time Calculation (min) (ACUTE ONLY): 36 min  Charges:  $Gait Training: 8-22 mins                     [place] Pager [phone] Office [phone]    Pesach Frisch 07/11/2021, 1:07 PM

## 2021-07-11 NOTE — Anesthesia Postprocedure Evaluation (Signed)
Anesthesia Post Note  Patient: Andrew Pacheco  Procedure(s) Performed: TOTAL HIP ARTHROPLASTY ANTERIOR APPROACH (Right: Hip)     Patient location during evaluation: PACU Anesthesia Type: Spinal Level of consciousness: awake and alert Pain management: pain level controlled Vital Signs Assessment: post-procedure vital signs reviewed and stable Respiratory status: spontaneous breathing and respiratory function stable Cardiovascular status: blood pressure returned to baseline and stable Postop Assessment: spinal receding Anesthetic complications: no   No notable events documented.  Last Vitals:  Vitals:   07/11/21 0547 07/11/21 1403  BP: 135/71 102/60  Pulse: (!) 101 83  Resp:  16  Temp: 37.5 C 36.6 C  SpO2: 94%     Last Pain:  Vitals:   07/11/21 1403  TempSrc: Oral  PainSc:                  Tiajuana Amass

## 2021-07-11 NOTE — Progress Notes (Signed)
[facility] PROGRESS NOTE   Andrew Pacheco [id number] DOB: [birth year] DOA: 07/08/2021  PCP: [name], MD  Brief History/Interval Summary: 80 y.o. male with medical history significant for essential hypertension, anemia, GERD, migraine, who was brought in by EMS after sustaining a mechanical fall at home resulting in right hip pain.  Patient found to have fracture involving his right hip.  He was hospitalized for further management.  Denies any history of cardiac disease.    Consultants: Orthopedics  Procedures: Right total hip arthroplasty, anterior approach 7/21  Antibiotics: Anti-infectives (From admission, onward)    Start     Dose/Rate Route Frequency Ordered Stop   07/10/21 0600  ceFAZolin (ANCEF) 2 g in sodium chloride 0.9 % 100 mL IVPB        2 g 200 mL/hr over 30 Minutes Intravenous On call to O.R. 07/09/21 1600 07/11/21 0559   07/09/21 1900  ceFAZolin (ANCEF) 2 g in sodium chloride 0.9 % 100 mL IVPB        2 g 200 mL/hr over 30 Minutes Intravenous Every 6 hours 07/09/21 1559 07/10/21 0207   07/09/21 1159  sodium chloride 0.9 % with ceFAZolin (ANCEF) ADS Med       Note to Pharmacy: Charmayne Sheer   : cabinet override      07/09/21 1159 07/10/21 0014       Subjective/Interval History: Patient complains of generalized body aches which he attributes to his fall.  His right hip feels stable.  No significant pain in that area.  Denies any chest pain shortness of breath lightheadedness.     Assessment/Plan:  Right hip fracture This was secondary to a mechanical fall.  Patient was seen by orthopedics.  Underwent total right hip arthroplasty on 7/21.  Seems to be doing well postoperatively.  Leukocytosis is likely reactive.  He is afebrile.  Postoperative anemia/acute blood loss anemia Slight drop in hemoglobin noted from yesterday to today but does not need blood transfusion currently.  Continue to monitor.  Transfuse if it drops less than 7.  Essential  hypertension/orthostatic hypotension Initially elevated blood pressures were likely due to pain issues.  When he worked with physical therapy yesterday was noted to have orthostatic drop in his blood pressure.  Remains on low-dose metoprolol.  IV fluid boluses were provided.  Seems to be stable this morning.  Continue to monitor.    History of GERD Continue PPI  Hyperlipidemia Continue statin.     DVT Prophylaxis: Lovenox Code Status: Full code Family Communication: Discussed with the patient Disposition Plan: Skilled nursing facility for rehab as recommended by physical and Occupational Therapy.  Social worker is following.    Status is: Inpatient  Remains inpatient appropriate because:Ongoing active pain requiring inpatient pain management  Dispo: The patient is from: Home              Anticipated d/c is to: SNF              Patient currently is not medically stable to d/c.   Difficult to place patient No         Medications: Scheduled:  brimonidine  1 drop Both Eyes BID   And   timolol  1 drop Both Eyes BID   chlorhexidine  60 mL Topical Once   docusate sodium  100 mg Oral BID   enoxaparin (LOVENOX) injection  40 mg Subcutaneous Q24H   feeding supplement  237 mL Oral BID BM   latanoprost  1 drop Both Eyes  QHS   metoprolol succinate  12.5 mg Oral Daily   mupirocin ointment  1 application Nasal BID   pantoprazole  40 mg Oral Daily   povidone-iodine  2 application Topical Once   prenatal multivitamin  1 tablet Oral Daily   senna  3 tablet Oral Q21 days   Continuous:  sodium chloride 75 mL/hr at 07/11/21 0937   methocarbamol (ROBAXIN) IV     [id number], HYDROcodone-acetaminophen, HYDROcodone-acetaminophen, HYDROmorphone (DILAUDID) injection, menthol-cetylpyridinium **OR** phenol, methocarbamol **OR** methocarbamol (ROBAXIN) IV, metoCLOPramide **OR** metoCLOPramide (REGLAN) injection, morphine injection, ondansetron **OR** ondansetron (ZOFRAN)  IV   Objective:  Vital Signs  Vitals:   07/10/21 0919 07/10/21 1316 07/10/21 2221 07/11/21 0547  BP: 117/63 (!) 102/58 (!) 120/54 135/71  Pulse: 85 81 89 (!) 101  Resp: '16 15 16   '$ Temp: 98 F (36.7 C) 97.9 F (36.6 C) 99.3 F (37.4 C) 99.5 F (37.5 C)  TempSrc: Oral Oral Oral Oral  SpO2: 97% 98% 96% 94%  Weight:      Height:        Intake/Output Summary (Last 24 hours) at 07/11/2021 1011 Last data filed at 07/11/2021 [id number] Gross per 24 hour  Intake 1620.68 ml  Output 700 ml  Net 920.68 ml    Filed Weights   07/09/21 0238  Weight: 66.6 kg    General appearance: Awake alert.  In no distress Resp: Clear to auscultation bilaterally.  Normal effort No injuries or bruising noted over the left chest area Cardio: S1-S2 is normal regular.  No S3-S4.  No rubs murmurs or bruit GI: Abdomen is soft.  Nontender nondistended.  Bowel sounds are present normal.  No masses organomegaly Extremities: No bruising noted over the right thigh area.  Swelling is present. Neurologic: Alert and oriented x3.  No focal neurological deficits.       Lab Results:  Data Reviewed: I have personally reviewed following labs and imaging studies  CBC: Recent Labs  Lab 07/08/21 2016 07/09/21 0336 07/10/21 0309 07/11/21 0211  WBC 6.5 9.0 13.9* 14.1*  NEUTROABS 4.2  --   --   --   HGB 11.9* 12.5* 9.0* 8.4*  HCT 37.1* 39.0 28.1* 26.5*  MCV 92.5 92.0 92.1 93.6  PLT 175 177 143* 140*     Basic Metabolic Panel: Recent Labs  Lab 07/08/21 2016 07/09/21 0336 07/10/21 0309 07/11/21 0211  NA 134* 139 136 136  K 3.6 4.1 4.5 4.6  CL 100 101 106 107  CO2 '23 25 25 24  '$ GLUCOSE 98 125* 117* 95  BUN '15 13 21 '$ 28*  CREATININE 1.21 0.93  0.96 1.19 1.35*  CALCIUM 9.3 9.7 8.6* 8.4*     GFR: Estimated Creatinine Clearance: 39.7 mL/min (A) (by C-G formula based on SCr of 1.35 mg/dL (H)).    Coagulation Profile: Recent Labs  Lab 07/08/21 2016  INR 1.0       Recent Results (from the  past 240 hour(s))  Resp Panel by RT-PCR (Flu A&B, Covid) Nasopharyngeal Swab     Status: None   Collection Time: 07/08/21  9:52 PM   Specimen: Nasopharyngeal Swab; Nasopharyngeal(NP) swabs in vial transport medium  Result Value Ref Range Status   SARS Coronavirus 2 by RT PCR NEGATIVE NEGATIVE Final    Comment: (NOTE) SARS-CoV-2 target nucleic acids are NOT DETECTED.  The SARS-CoV-2 RNA is generally detectable in upper respiratory specimens during the acute phase of infection. The lowest concentration of SARS-CoV-2 viral copies this assay can detect is 138 copies/mL. A  negative result does not preclude SARS-Cov-2 infection and should not be used as the sole basis for treatment or other patient management decisions. A negative result may occur with  improper specimen collection/handling, submission of specimen other than nasopharyngeal swab, presence of viral mutation(s) within the areas targeted by this assay, and inadequate number of viral copies(<138 copies/mL). A negative result must be combined with clinical observations, patient history, and epidemiological information. The expected result is Negative.  Fact Sheet for Patients:  EntrepreneurPulse.com.au  Fact Sheet for Healthcare Providers:  IncredibleEmployment.be  This test is no t yet approved or cleared by the [place] FDA and  has been authorized for detection and/or diagnosis of SARS-CoV-2 by FDA under an Emergency Use Authorization (EUA). This EUA will remain  in effect (meaning this test can be used) for the duration of the COVID-19 declaration under Section 564(b)(1) of the Act, 21 U.S.C.section 360bbb-3(b)(1), unless the authorization is terminated  or revoked sooner.       Influenza A by PCR NEGATIVE NEGATIVE Final   Influenza B by PCR NEGATIVE NEGATIVE Final    Comment: (NOTE) The Xpert Xpress SARS-CoV-2/FLU/RSV plus assay is intended as an aid in the diagnosis of  influenza from Nasopharyngeal swab specimens and should not be used as a sole basis for treatment. Nasal washings and aspirates are unacceptable for Xpert Xpress SARS-CoV-2/FLU/RSV testing.  Fact Sheet for Patients: EntrepreneurPulse.com.au  Fact Sheet for Healthcare Providers: IncredibleEmployment.be  This test is not yet approved or cleared by the [place] FDA and has been authorized for detection and/or diagnosis of SARS-CoV-2 by FDA under an Emergency Use Authorization (EUA). This EUA will remain in effect (meaning this test can be used) for the duration of the COVID-19 declaration under Section 564(b)(1) of the Act, 21 U.S.C. section 360bbb-3(b)(1), unless the authorization is terminated or revoked.  Performed at [facility], [place] [address] [place], [place] [address]   Surgical PCR screen     Status: None   Collection Time: 07/09/21  4:36 AM   Specimen: Nasal Mucosa; Nasal Swab  Result Value Ref Range Status   MRSA, PCR NEGATIVE NEGATIVE Final   Staphylococcus aureus NEGATIVE NEGATIVE Final    Comment: (NOTE) The Xpert SA Assay (FDA approved for NASAL specimens in patients 31 years of age and older), is one component of a comprehensive surveillance program. It is not intended to diagnose infection nor to guide or monitor treatment. Performed at [facility], [place] [address], [place], [place] [address]        Radiology Studies: Pelvis Portable  Result Date: 07/09/2021 CLINICAL DATA:  Postop right hip replacement EXAM: PORTABLE PELVIS 1-2 VIEWS COMPARISON:  Preoperative imaging. FINDINGS: Right hip arthroplasty in expected alignment. No periprosthetic lucency or fracture. Recent postsurgical change includes air and edema in the soft tissues. IMPRESSION: Right hip arthroplasty without immediate postoperative complication. Electronically Signed   By: Keith Rake M.D.   On: 07/09/2021 15:39   DG C-Arm  1-60 Min-No Report  Result Date: 07/09/2021 Fluoroscopy was utilized by the requesting physician.  No radiographic interpretation.   DG HIP OPERATIVE UNILAT W OR W/O PELVIS RIGHT  Result Date: 07/09/2021 CLINICAL DATA:  Right anterior hip replaced. EXAM: OPERATIVE RIGHT HIP (WITH PELVIS IF PERFORMED) TECHNIQUE: Fluoroscopic spot image(s) were submitted for interpretation post-operatively. COMPARISON:  None. FINDINGS: Eight fluoroscopic spot views of the pelvis and right hip obtained in the operating room. Interval right hip arthroplasty. Fluoroscopy time 12 seconds. IMPRESSION: Procedural fluoroscopy for  right hip arthroplasty Electronically Signed   By: Keith Rake M.D.   On: 07/09/2021 14:42       LOS: 3 days   [facility] Pager on [email or web address]  07/11/2021, 10:11 AM

## 2021-07-11 NOTE — Progress Notes (Signed)
Physical Therapy Treatment Patient Details Name: Andrew Pacheco [name]: [id number] DOB: March 29, 1941 Today's Date: 07/11/2021    History of Present Illness Patient is 80 y.o. male s/p Rt THA anterior approach on 07/09/21 secondary to Rt femoral neck fracture from a fall. PMh significant for OA, anemia, GERD, glaucoma, HTN, back pain, migraine.    PT Comments    Pt continues pleasant and cooperative and with marked improvement in activity tolerance 2* no c/o nausea.  Follow Up Recommendations  SNF     Equipment Recommendations  Rolling walker with 5" wheels;3in1 (PT)    Recommendations for Other Services       Precautions / Restrictions Precautions Precautions: Fall Restrictions Weight Bearing Restrictions: No RLE Weight Bearing: Weight bearing as tolerated    Mobility  Bed Mobility Overal bed mobility: Needs Assistance Bed Mobility: Sit to Supine       Sit to supine: Mod assist   General bed mobility comments: cues for sequence and use of L LE to self assist    Transfers Overall transfer level: Needs assistance Equipment used: Rolling walker (2 wheeled) Transfers: Sit to/from Stand Sit to Stand: Min assist;Mod assist         General transfer comment: cues for LE management and use of UEs to self assist  Ambulation/Gait Ambulation/Gait assistance: Min assist Gait Distance (Feet): 40 Feet Assistive device: Rolling walker (2 wheeled) Gait Pattern/deviations: Step-to pattern;Decreased step length - left;Decreased weight shift to right Gait velocity: decr   General Gait Details: cues for sequence, posture and position from RW; assist for balance/support and to advance R LE   Stairs             Wheelchair Mobility    Modified Rankin (Stroke Patients Only)       Balance Overall balance assessment: Needs assistance Sitting-balance support: Feet supported Sitting balance-Leahy Scale: Good     Standing balance support: During functional  activity;Bilateral upper extremity supported Standing balance-Leahy Scale: Poor                              Cognition Arousal/Alertness: Awake/alert Behavior During Therapy: WFL for tasks assessed/performed Overall Cognitive Status: Within Functional Limits for tasks assessed                                        Exercises      General Comments        Pertinent Vitals/Pain Pain Assessment: 0-10 Pain Score: 4  Pain Location: Rt hip Pain Descriptors / Indicators: Aching;Discomfort;Guarding Pain Intervention(s): Limited activity within patient's tolerance;Monitored during session;Premedicated before session    Home Living                      Prior Function            PT Goals (current goals can now be found in the care plan section) Acute Rehab PT Goals Patient Stated Goal: regain independence and strength PT Goal Formulation: With patient Time For Goal Achievement: 07/17/21 Potential to Achieve Goals: Good Progress towards PT goals: Progressing toward goals    Frequency    Min 5X/week      PT Plan Current plan remains appropriate    Co-evaluation              AM-PAC PT "6 Clicks" Mobility   Outcome Measure  Help needed turning from your back to your side while in a flat bed without using bedrails?: A Little Help needed moving from lying on your back to sitting on the side of a flat bed without using bedrails?: A Little Help needed moving to and from a bed to a chair (including a wheelchair)?: A Little Help needed standing up from a chair using your arms (e.g., wheelchair or bedside chair)?: A Little Help needed to walk in hospital room?: A Little Help needed climbing 3-5 steps with a railing? : A Lot 6 Click Score: 17    End of Session Equipment Utilized During Treatment: Gait belt Activity Tolerance: Patient tolerated treatment well Patient left: in bed;with call bell/phone within reach Nurse Communication:  Mobility status PT Visit Diagnosis: Muscle weakness (generalized) (M62.81);Difficulty in walking, not elsewhere classified (R26.2);Other symptoms and signs involving the nervous system (R29.898)     Time: [id number] PT Time Calculation (min) (ACUTE ONLY): 32 min  Charges:  $Gait Training: 23-37 mins                     [place] Pager [phone] Office [phone]    Joshuan Bolander 07/11/2021, 4:08 PM

## 2021-07-12 DIAGNOSIS — D649 Anemia, unspecified: Secondary | ICD-10-CM

## 2021-07-12 DIAGNOSIS — I10 Essential (primary) hypertension: Secondary | ICD-10-CM | POA: Diagnosis not present

## 2021-07-12 DIAGNOSIS — S72001A Fracture of unspecified part of neck of right femur, initial encounter for closed fracture: Secondary | ICD-10-CM | POA: Diagnosis not present

## 2021-07-12 LAB — CBC
HCT: 23.5 % — ABNORMAL LOW (ref 39.0–52.0)
Hemoglobin: 7.3 g/dL — ABNORMAL LOW (ref 13.0–17.0)
MCH: 29.4 pg (ref 26.0–34.0)
MCHC: 31.1 g/dL (ref 30.0–36.0)
MCV: 94.8 fL (ref 80.0–100.0)
Platelets: 130 10*3/uL — ABNORMAL LOW (ref 150–400)
RBC: 2.48 MIL/uL — ABNORMAL LOW (ref 4.22–5.81)
RDW: 13.9 % (ref 11.5–15.5)
WBC: 11.6 10*3/uL — ABNORMAL HIGH (ref 4.0–10.5)
nRBC: 0 % (ref 0.0–0.2)

## 2021-07-12 LAB — BASIC METABOLIC PANEL
Anion gap: 8 (ref 5–15)
BUN: 27 mg/dL — ABNORMAL HIGH (ref 8–23)
CO2: 25 mmol/L (ref 22–32)
Calcium: 8.6 mg/dL — ABNORMAL LOW (ref 8.9–10.3)
Chloride: 105 mmol/L (ref 98–111)
Creatinine, Ser: 1.28 mg/dL — ABNORMAL HIGH (ref 0.61–1.24)
GFR, Estimated: 56 mL/min — ABNORMAL LOW (ref >60)
Glucose, Bld: 101 mg/dL — ABNORMAL HIGH (ref 70–99)
Potassium: 4.8 mmol/L (ref 3.5–5.1)
Sodium: 138 mmol/L (ref 135–145)

## 2021-07-12 MED ORDER — SODIUM CHLORIDE 0.9% IV SOLUTION: Freq: Once | INTRAVENOUS | Status: AC

## 2021-07-12 NOTE — TOC Progression Note (Signed)
Transition of Care [facility]) - Progression Note    Patient Details  Name: Andrew Pacheco [name]: [id number] Date of Birth: 04/27/1941  [facility]) CM/SW Contact  Ross Ludwig, [place] Phone Number: 07/12/2021, 3:52 PM  Clinical Narrative:     CSW spoke to patient's wife and provided bed offers for SNF.  CSW informed her that per PT, patient does not qualify for CIR, and he will have to go to SNF.  CSW started insurance authorization, reference number [id number].  Patient's wife is reviewing bed offers and will call back to make a decision.   Expected Discharge Plan: [place] Barriers to Discharge: Continued Medical Work up  Expected Discharge Plan and Services Expected Discharge Plan: [place] In-house Referral: Clinical Social Work   Post Acute Care Choice: [place] Living arrangements for the past 2 months: Single Family Home                                       Social Determinants of Health (SDOH) Interventions    Readmission Risk Interventions No flowsheet data found.

## 2021-07-12 NOTE — Progress Notes (Signed)
Physical Therapy Treatment Patient Details Name: Andrew Pacheco [name]: [id number] DOB: 1941-07-17 Today's Date: 07/12/2021    History of Present Illness Patient is 80 y.o. male s/p Rt THA anterior approach on 07/09/21 secondary to Rt femoral neck fracture from a fall. PMh significant for OA, anemia, GERD, glaucoma, HTN, back pain, migraine.    PT Comments    Pt performed therex program with assist.  OOB deferred this date - pt with Hgb 7.3 and reports feeling weak/lightheaded with earlier move to bathroom.  Transfusion has started.  Follow Up Recommendations  SNF     Equipment Recommendations  Rolling walker with 5" wheels;3in1 (PT)    Recommendations for Other Services       Precautions / Restrictions Precautions Precautions: Fall Restrictions Weight Bearing Restrictions: No RLE Weight Bearing: Weight bearing as tolerated    Mobility  Bed Mobility               General bed mobility comments: OOB deferred - Hgb 7.3 and blood transfusing    Transfers                    Ambulation/Gait                 Stairs             Wheelchair Mobility    Modified Rankin (Stroke Patients Only)       Balance                                            Cognition Arousal/Alertness: Awake/alert Behavior During Therapy: WFL for tasks assessed/performed Overall Cognitive Status: Within Functional Limits for tasks assessed                                        Exercises Total Joint Exercises Ankle Circles/Pumps: AROM;Both;20 reps;Seated Quad Sets: AROM;Both;10 reps;Supine Heel Slides: AAROM;Right;20 reps;Supine Hip ABduction/ADduction: AAROM;Right;Supine;20 reps    General Comments        Pertinent Vitals/Pain Pain Assessment: 0-10 Pain Score: 5  Pain Location: Rt hip Pain Descriptors / Indicators: Aching;Discomfort;Guarding Pain Intervention(s): Limited activity within patient's tolerance;Monitored during  session;Premedicated before session;Ice applied    Home Living                      Prior Function            PT Goals (current goals can now be found in the care plan section) Acute Rehab PT Goals Patient Stated Goal: regain independence and strength PT Goal Formulation: With patient Time For Goal Achievement: 07/17/21 Potential to Achieve Goals: Good Progress towards PT goals: Progressing toward goals    Frequency    Min 5X/week      PT Plan Current plan remains appropriate    Co-evaluation              AM-PAC PT "6 Clicks" Mobility   Outcome Measure  Help needed turning from your back to your side while in a flat bed without using bedrails?: A Little Help needed moving from lying on your back to sitting on the side of a flat bed without using bedrails?: A Little Help needed moving to and from a bed to a chair (including a wheelchair)?: A Little Help needed  standing up from a chair using your arms (e.g., wheelchair or bedside chair)?: A Little Help needed to walk in hospital room?: A Little Help needed climbing 3-5 steps with a railing? : A Lot 6 Click Score: 17    End of Session   Activity Tolerance: Patient tolerated treatment well Patient left: in bed;with call bell/phone within reach;with family/visitor present;with bed alarm set Nurse Communication: Mobility status PT Visit Diagnosis: Muscle weakness (generalized) (M62.81);Difficulty in walking, not elsewhere classified (R26.2);Other symptoms and signs involving the nervous system (R29.898)     Time: 1445-1510 PT Time Calculation (min) (ACUTE ONLY): 25 min  Charges:  $Therapeutic Exercise: 8-22 mins                     [place] Pager [phone] Office [phone]    [facility] 07/12/2021, 3:39 PM

## 2021-07-12 NOTE — Plan of Care (Signed)
  Problem: Clinical Measurements: Goal: Respiratory complications will improve Outcome: Progressing   Problem: Clinical Measurements: Goal: Cardiovascular complication will be avoided Outcome: Progressing   Problem: Activity: Goal: Risk for activity intolerance will decrease Outcome: Progressing   Problem: Nutrition: Goal: Adequate nutrition will be maintained Outcome: Progressing   Problem: Pain Managment: Goal: General experience of comfort will improve Outcome: Progressing

## 2021-07-12 NOTE — Progress Notes (Signed)
Pt reported left wall chest pain 5/10 radiating to back with no other associated s/s. Pt denied shortness of breath, nausea, or any other associated symptoms. This chest pain was relieved with hydromorphone and rest. Pt had been doing leg exercises with physical therapy at the time of chest pain. Pt reports he gets this at home as well and this is relieved with a muscle relaxer as well (at home). Pt remains stable throughout event. Rn will continue to monitor.

## 2021-07-12 NOTE — Progress Notes (Signed)
[facility] PROGRESS NOTE   Andrew Pacheco [id number] DOB: 14-Sep-1941 DOA: 07/08/2021  PCP: [name], MD  Brief History/Interval Summary: 80 y.o. male with medical history significant for essential hypertension, anemia, GERD, migraine, who was brought in by EMS after sustaining a mechanical fall at home resulting in right hip pain.  Patient found to have fracture involving his right hip.  He was hospitalized for further management.  Denies any history of cardiac disease.    Consultants: Orthopedics  Procedures: Right total hip arthroplasty, anterior approach 7/21  Antibiotics: Anti-infectives (From admission, onward)    Start     Dose/Rate Route Frequency Ordered Stop   07/10/21 0600  ceFAZolin (ANCEF) 2 g in sodium chloride 0.9 % 100 mL IVPB        2 g 200 mL/hr over 30 Minutes Intravenous On call to O.R. 07/09/21 1600 07/11/21 0559   07/09/21 1900  ceFAZolin (ANCEF) 2 g in sodium chloride 0.9 % 100 mL IVPB        2 g 200 mL/hr over 30 Minutes Intravenous Every 6 hours 07/09/21 1559 07/10/21 0207   07/09/21 1159  sodium chloride 0.9 % with ceFAZolin (ANCEF) ADS Med       Note to Pharmacy: Charmayne Sheer   : cabinet override      07/09/21 1159 07/10/21 0014       Subjective/Interval History: Patient with on and off pain in the right hip area.  A little bit of fatigue is also present.  No chest pain or shortness of breath    Assessment/Plan:  Right hip fracture This was secondary to a mechanical fall.  Patient was seen by orthopedics.  Underwent total right hip arthroplasty on 7/21.  Seems to be stable from surgical standpoint.  Leukocytosis is likely reactive.    Postoperative anemia/acute blood loss anemia Hemoglobin has been slowly trending down.  Now 7.3.  He does have some fatigue.  May benefit from a unit of PRBC which will be ordered.  He is agreeable.    Essential hypertension/orthostatic hypotension Blood pressure stable.  Some orthostatic drop  in blood pressure was noted 2 days ago.  He was also noted to be slightly more dehydrated and was given IV fluids.  Creatinine bumped up to 1.35 and better at 1.28 today.  Encourage oral intake.    History of GERD Continue PPI  Hyperlipidemia Continue statin.     DVT Prophylaxis: Lovenox Code Status: Full code Family Communication: Discussed with the patient.  We will call his wife later today. Disposition Plan: Skilled nursing facility for rehab as recommended by physical and Occupational Therapy.  Social worker is following.    Status is: Inpatient  Remains inpatient appropriate because:Ongoing active pain requiring inpatient pain management  Dispo: The patient is from: Home              Anticipated d/c is to: SNF              Patient currently is not medically stable to d/c.   Difficult to place patient No         Medications: Scheduled:  brimonidine  1 drop Both Eyes BID   And   timolol  1 drop Both Eyes BID   chlorhexidine  60 mL Topical Once   docusate sodium  100 mg Oral BID   enoxaparin (LOVENOX) injection  40 mg Subcutaneous Q24H   feeding supplement  237 mL Oral BID BM   latanoprost  1 drop Both Eyes  QHS   metoprolol succinate  12.5 mg Oral Daily   mupirocin ointment  1 application Nasal BID   pantoprazole  40 mg Oral Daily   povidone-iodine  2 application Topical Once   prenatal multivitamin  1 tablet Oral Daily   senna  3 tablet Oral Q21 days   Continuous:  methocarbamol (ROBAXIN) IV     [id number], HYDROcodone-acetaminophen, HYDROcodone-acetaminophen, HYDROmorphone (DILAUDID) injection, menthol-cetylpyridinium **OR** phenol, methocarbamol **OR** methocarbamol (ROBAXIN) IV, metoCLOPramide **OR** metoCLOPramide (REGLAN) injection, morphine injection, ondansetron **OR** ondansetron (ZOFRAN) IV   Objective:  Vital Signs  Vitals:   07/11/21 1403 07/11/21 2239 07/12/21 0127 07/12/21 0641  BP: 102/60 (!) 115/56  127/66  Pulse: 83 85  94  Resp:  '16 16  16  '$ Temp: 97.9 F (36.6 C) 98.9 F (37.2 C)  98.5 F (36.9 C)  TempSrc: Oral Oral  Oral  SpO2:   99% 93%  Weight:      Height:        Intake/Output Summary (Last 24 hours) at 07/12/2021 0911 Last data filed at 07/12/2021 0728 Gross per 24 hour  Intake 1816.76 ml  Output 1900 ml  Net -83.24 ml    Filed Weights   07/09/21 0238  Weight: 66.6 kg    General appearance: Awake alert.  In no distress Resp: Clear to auscultation bilaterally.  Normal effort Cardio: S1-S2 is normal regular.  No S3-S4.  No rubs murmurs or bruit GI: Abdomen is soft.  Nontender nondistended.  Bowel sounds are present normal.  No masses organomegaly Extremities: Swelling noted over the right thigh area.  No bruising noted. Neurologic: Alert and oriented x3.  No focal neurological deficits.      Lab Results:  Data Reviewed: I have personally reviewed following labs and imaging studies  CBC: Recent Labs  Lab 07/08/21 2016 07/09/21 0336 07/10/21 0309 07/11/21 0211 07/12/21 0308  WBC 6.5 9.0 13.9* 14.1* 11.6*  NEUTROABS 4.2  --   --   --   --   HGB 11.9* 12.5* 9.0* 8.4* 7.3*  HCT 37.1* 39.0 28.1* 26.5* 23.5*  MCV 92.5 92.0 92.1 93.6 94.8  PLT 175 177 143* 140* 130*     Basic Metabolic Panel: Recent Labs  Lab 07/08/21 2016 07/09/21 0336 07/10/21 0309 07/11/21 0211 07/12/21 0308  NA 134* 139 136 136 138  K 3.6 4.1 4.5 4.6 4.8  CL 100 101 106 107 105  CO2 '23 25 25 24 25  '$ GLUCOSE 98 125* 117* 95 101*  BUN '15 13 21 '$ 28* 27*  CREATININE 1.21 0.93  0.96 1.19 1.35* 1.28*  CALCIUM 9.3 9.7 8.6* 8.4* 8.6*     GFR: Estimated Creatinine Clearance: 41.9 mL/min (A) (by C-G formula based on SCr of 1.28 mg/dL (H)).    Coagulation Profile: Recent Labs  Lab 07/08/21 2016  INR 1.0       Recent Results (from the past 240 hour(s))  Resp Panel by RT-PCR (Flu A&B, Covid) Nasopharyngeal Swab     Status: None   Collection Time: 07/08/21  9:52 PM   Specimen: Nasopharyngeal Swab;  Nasopharyngeal(NP) swabs in vial transport medium  Result Value Ref Range Status   SARS Coronavirus 2 by RT PCR NEGATIVE NEGATIVE Final    Comment: (NOTE) SARS-CoV-2 target nucleic acids are NOT DETECTED.  The SARS-CoV-2 RNA is generally detectable in upper respiratory specimens during the acute phase of infection. The lowest concentration of SARS-CoV-2 viral copies this assay can detect is 138 copies/mL. A negative result does not preclude SARS-Cov-2  infection and should not be used as the sole basis for treatment or other patient management decisions. A negative result may occur with  improper specimen collection/handling, submission of specimen other than nasopharyngeal swab, presence of viral mutation(s) within the areas targeted by this assay, and inadequate number of viral copies(<138 copies/mL). A negative result must be combined with clinical observations, patient history, and epidemiological information. The expected result is Negative.  Fact Sheet for Patients:  EntrepreneurPulse.com.au  Fact Sheet for Healthcare Providers:  IncredibleEmployment.be  This test is no t yet approved or cleared by the [place] FDA and  has been authorized for detection and/or diagnosis of SARS-CoV-2 by FDA under an Emergency Use Authorization (EUA). This EUA will remain  in effect (meaning this test can be used) for the duration of the COVID-19 declaration under Section 564(b)(1) of the Act, 21 U.S.C.section 360bbb-3(b)(1), unless the authorization is terminated  or revoked sooner.       Influenza A by PCR NEGATIVE NEGATIVE Final   Influenza B by PCR NEGATIVE NEGATIVE Final    Comment: (NOTE) The Xpert Xpress SARS-CoV-2/FLU/RSV plus assay is intended as an aid in the diagnosis of influenza from Nasopharyngeal swab specimens and should not be used as a sole basis for treatment. Nasal washings and aspirates are unacceptable for Xpert Xpress  SARS-CoV-2/FLU/RSV testing.  Fact Sheet for Patients: EntrepreneurPulse.com.au  Fact Sheet for Healthcare Providers: IncredibleEmployment.be  This test is not yet approved or cleared by the [place] FDA and has been authorized for detection and/or diagnosis of SARS-CoV-2 by FDA under an Emergency Use Authorization (EUA). This EUA will remain in effect (meaning this test can be used) for the duration of the COVID-19 declaration under Section 564(b)(1) of the Act, 21 U.S.C. section 360bbb-3(b)(1), unless the authorization is terminated or revoked.  Performed at [facility], [place] [address] [place], [place] [address]   Surgical PCR screen     Status: None   Collection Time: 07/09/21  4:36 AM   Specimen: Nasal Mucosa; Nasal Swab  Result Value Ref Range Status   MRSA, PCR NEGATIVE NEGATIVE Final   Staphylococcus aureus NEGATIVE NEGATIVE Final    Comment: (NOTE) The Xpert SA Assay (FDA approved for NASAL specimens in patients 55 years of age and older), is one component of a comprehensive surveillance program. It is not intended to diagnose infection nor to guide or monitor treatment. Performed at [facility], [place] [address], [place], [place] [address]        Radiology Studies: No results found.     LOS: 4 days   Roddrick Sharron [employer] on [email or web address]  07/12/2021, 9:11 AM

## 2021-07-13 DIAGNOSIS — S72001A Fracture of unspecified part of neck of right femur, initial encounter for closed fracture: Secondary | ICD-10-CM | POA: Diagnosis not present

## 2021-07-13 LAB — CBC
HCT: 25.3 % — ABNORMAL LOW (ref 39.0–52.0)
Hemoglobin: 8.2 g/dL — ABNORMAL LOW (ref 13.0–17.0)
MCH: 30.1 pg (ref 26.0–34.0)
MCHC: 32.4 g/dL (ref 30.0–36.0)
MCV: 93 fL (ref 80.0–100.0)
Platelets: 141 10*3/uL — ABNORMAL LOW (ref 150–400)
RBC: 2.72 MIL/uL — ABNORMAL LOW (ref 4.22–5.81)
RDW: 13.5 % (ref 11.5–15.5)
WBC: 8.8 10*3/uL (ref 4.0–10.5)
nRBC: 0 % (ref 0.0–0.2)

## 2021-07-13 LAB — TYPE AND SCREEN
ABO/RH(D): A POS
Antibody Screen: POSITIVE
Donor AG Type: NEGATIVE
PT AG Type: NEGATIVE
Unit division: 0
Unit division: 0

## 2021-07-13 LAB — BPAM RBC
Blood Product Expiration Date: 202208102359
Blood Product Expiration Date: 202208102359
ISSUE DATE / TIME: 202207241411
Unit Type and Rh: 6200
Unit Type and Rh: 6200

## 2021-07-13 LAB — BASIC METABOLIC PANEL
Anion gap: 7 (ref 5–15)
BUN: 24 mg/dL — ABNORMAL HIGH (ref 8–23)
CO2: 25 mmol/L (ref 22–32)
Calcium: 8.7 mg/dL — ABNORMAL LOW (ref 8.9–10.3)
Chloride: 105 mmol/L (ref 98–111)
Creatinine, Ser: 1.13 mg/dL (ref 0.61–1.24)
GFR, Estimated: >60 mL/min (ref >60)
Glucose, Bld: 97 mg/dL (ref 70–99)
Potassium: 4.8 mmol/L (ref 3.5–5.1)
Sodium: 137 mmol/L (ref 135–145)

## 2021-07-13 MED ORDER — POLYETHYLENE GLYCOL 3350 17 G PO PACK
17.0000 g | PACK | Freq: Every day | ORAL | Status: DC
  Administered 2021-07-13: 17 g via ORAL
  Filled 2021-07-13: qty 1

## 2021-07-13 MED ORDER — FLEET ENEMA 7-19 GM/118ML RE ENEM
1.0000 | ENEMA | Freq: Once | RECTAL | Status: AC
  Administered 2021-07-13: 1 via RECTAL
  Filled 2021-07-13: qty 1

## 2021-07-13 MED ORDER — SENNOSIDES-DOCUSATE SODIUM 8.6-50 MG PO TABS: 2.0000 | ORAL_TABLET | Freq: Two times a day (BID) | ORAL | Status: AC

## 2021-07-13 MED ORDER — SENNOSIDES-DOCUSATE SODIUM 8.6-50 MG PO TABS
2.0000 | ORAL_TABLET | Freq: Two times a day (BID) | ORAL | Status: DC
  Administered 2021-07-13: 2 via ORAL
  Filled 2021-07-13: qty 2

## 2021-07-13 MED ORDER — POLYETHYLENE GLYCOL 3350 17 G PO PACK: 17.0000 g | PACK | Freq: Every day | ORAL | 0 refills | Status: AC

## 2021-07-13 MED ORDER — ZOLPIDEM TARTRATE 10 MG PO TABS: 10.0000 mg | ORAL_TABLET | Freq: Every evening | ORAL | 0 refills | Status: AC | PRN

## 2021-07-13 NOTE — TOC Transition Note (Signed)
Transition of Care [facility]) - CM/SW Discharge Note  Patient Details  Name: Andrew Pacheco [name]: [id number] Date of Birth: [birth year]  Transition of Care [facility]) CM/SW Contact:  Sherie Don, LCSW Phone Number: 07/13/2021, 3:38 PM  Clinical Narrative: Patient's wife is requesting [facility] Pacheco for SNF. [name] with [facility] can accept the patient today. CSW uploaded additional clinicals to [name] for insurance approval. Patient has been approved starting 07/13/21 with the next review date of 07/15/2021. Plan Auth ID is: [id number]. Patient will go to [name] and the number for report is [phone]. Discharge summary, discharge orders, and SNF transfer report faxed to facility in hub. [facility] will test patient for COVID at the facility. PTAR scheduled; medical necessity form done. Wife updated. RN updated. TOC signing off.  Final next level of care: Skilled Nursing Facility Barriers to Discharge: Barriers Resolved  Patient Goals and CMS Choice Patient states their goals for this hospitalization and ongoing recovery are:: pt's goal is to be able to return home following rehab CMS Medicare.gov Compare Post Acute Care list provided to:: Patient Represenative (must comment) Choice offered to / list presented to : Patient, Spouse  Discharge Placement PASRR number recieved: 07/10/21      Patient [facility] bed at: [facility] Patient to be transferred to facility by: [facility] Name of family member notified: Andrew Pacheco (spouse) Patient and family notified of of transfer: 07/13/21  Discharge Plan and Services In-house Referral: Clinical Social Work Post Acute Care Choice: [place]          DME Arranged: N/A DME Agency: NA  Readmission Risk Interventions No flowsheet data found.

## 2021-07-13 NOTE — Care Management Important Message (Signed)
Important Message  Patient Details IM Letter given to the Patient. Name: Andrew Pacheco [name]: [id number] Date of Birth: 1941-06-14   Medicare Important Message Given:  Yes     Kerin Salen 07/13/2021, 1:27 PM

## 2021-07-13 NOTE — Discharge Summary (Signed)
[facility]  Physician Discharge Summary   Patient ID: Andrew Pacheco [name]: [id number] DOB/AGE: [birth year] 80 y.o.  Admit date: 07/08/2021 Discharge date:   07/13/2021   PCP: [name], MD  DISCHARGE DIAGNOSES:  Right hip fracture status post right total hip arthroplasty Postoperative anemia/acute blood loss anemia Essential hypertension History of GERD   RECOMMENDATIONS FOR OUTPATIENT FOLLOW UP: Please check CBC and basic metabolic panel in 3 to 4 days and then as per SNF protocol    Home Health: Going to SNF Equipment/Devices: None  CODE STATUS: Full code  DISCHARGE CONDITION: fair  Diet recommendation: Heart healthy  INITIAL HISTORY: 80 y.o. male with medical history significant for essential hypertension, anemia, GERD, migraine, who was brought in by EMS after sustaining a mechanical fall at home resulting in right hip pain.  Patient found to have fracture involving his right hip.  He was hospitalized for further management.  Denies any history of cardiac disease.     Consultants: Orthopedics   Procedures: Right total hip arthroplasty, anterior approach 7/21   HOSPITAL COURSE:   Right hip fracture This was secondary to a mechanical fall.  Patient was seen by orthopedics.  Underwent total right hip arthroplasty on 7/21.  Seems to be stable from surgical standpoint.  Leukocytosis is likely reactive.  Aspirin for DVT prophylaxis per orthopedics   Postoperative anemia/acute blood loss anemia Patient had downward trending hemoglobin.  Most likely related to surgery and hip fracture.  Was transfused 1 unit of PRBC on 7/25 with appropriate response.  Recommend rechecking CBC before end of this week at the SNF.      Essential hypertension/orthostatic hypotension Blood pressure stable.  Some orthostatic drop in blood pressure was noted a few days ago.  He was also noted to be slightly more dehydrated and was given IV fluids.  Creatinine bumped up to 1.35.  He  was gently hydrated.  Creatinine is back to normal now.  History of GERD Continue PPI  Hyperlipidemia He has apparently stopped taking his statin.  He can discuss this further with his PCP.  Constipation Has not had a bowel movement in several days.  This is most likely due to his fracture, medications, and mobility.  He was started on a bowel regimen.  Can be given enema prior to discharge  Overall stable.  Okay for discharge to SNF once bed is available and once he has had a bowel movement.    PERTINENT LABS:  The results of significant diagnostics from this hospitalization (including imaging, microbiology, ancillary and laboratory) are listed below for reference.    Microbiology: Recent Results (from the past 240 hour(s))  Resp Panel by RT-PCR (Flu A&B, Covid) Nasopharyngeal Swab     Status: None   Collection Time: 07/08/21  9:52 PM   Specimen: Nasopharyngeal Swab; Nasopharyngeal(NP) swabs in vial transport medium  Result Value Ref Range Status   SARS Coronavirus 2 by RT PCR NEGATIVE NEGATIVE Final    Comment: (NOTE) SARS-CoV-2 target nucleic acids are NOT DETECTED.  The SARS-CoV-2 RNA is generally detectable in upper respiratory specimens during the acute phase of infection. The lowest concentration of SARS-CoV-2 viral copies this assay can detect is 138 copies/mL. A negative result does not preclude SARS-Cov-2 infection and should not be used as the sole basis for treatment or other patient management decisions. A negative result may occur with  improper specimen collection/handling, submission of specimen other than nasopharyngeal swab, presence of viral mutation(s) within the areas targeted by this assay,  and inadequate number of viral copies(<138 copies/mL). A negative result must be combined with clinical observations, patient history, and epidemiological information. The expected result is Negative.  Fact Sheet for Patients:   EntrepreneurPulse.com.au  Fact Sheet for Healthcare Providers:  IncredibleEmployment.be  This test is no t yet approved or cleared by the [place] FDA and  has been authorized for detection and/or diagnosis of SARS-CoV-2 by FDA under an Emergency Use Authorization (EUA). This EUA will remain  in effect (meaning this test can be used) for the duration of the COVID-19 declaration under Section 564(b)(1) of the Act, 21 U.S.C.section 360bbb-3(b)(1), unless the authorization is terminated  or revoked sooner.       Influenza A by PCR NEGATIVE NEGATIVE Final   Influenza B by PCR NEGATIVE NEGATIVE Final    Comment: (NOTE) The Xpert Xpress SARS-CoV-2/FLU/RSV plus assay is intended as an aid in the diagnosis of influenza from Nasopharyngeal swab specimens and should not be used as a sole basis for treatment. Nasal washings and aspirates are unacceptable for Xpert Xpress SARS-CoV-2/FLU/RSV testing.  Fact Sheet for Patients: EntrepreneurPulse.com.au  Fact Sheet for Healthcare Providers: IncredibleEmployment.be  This test is not yet approved or cleared by the [place] FDA and has been authorized for detection and/or diagnosis of SARS-CoV-2 by FDA under an Emergency Use Authorization (EUA). This EUA will remain in effect (meaning this test can be used) for the duration of the COVID-19 declaration under Section 564(b)(1) of the Act, 21 U.S.C. section 360bbb-3(b)(1), unless the authorization is terminated or revoked.  Performed at [facility], [place] [address] [place], [place] [address]   Surgical PCR screen     Status: None   Collection Time: 07/09/21  4:36 AM   Specimen: Nasal Mucosa; Nasal Swab  Result Value Ref Range Status   MRSA, PCR NEGATIVE NEGATIVE Final   Staphylococcus aureus NEGATIVE NEGATIVE Final    Comment: (NOTE) The Xpert SA Assay (FDA approved for NASAL specimens in patients  31 years of age and older), is one component of a comprehensive surveillance program. It is not intended to diagnose infection nor to guide or monitor treatment. Performed at [facility], [place] [address], [place], [place] [address]      Labs:  COVID-19 Labs   Lab Results  Component Value Date   [place] NEGATIVE 07/08/2021      Basic Metabolic Panel: Recent Labs  Lab 07/09/21 0336 07/10/21 0309 07/11/21 0211 07/12/21 0308 07/13/21 0257  NA 139 136 136 138 137  K 4.1 4.5 4.6 4.8 4.8  CL 101 106 107 105 105  CO2 '25 25 24 25 25  '$ GLUCOSE 125* 117* 95 101* 97  BUN 13 21 28* 27* 24*  CREATININE 0.93  0.96 1.19 1.35* 1.28* 1.13  CALCIUM 9.7 8.6* 8.4* 8.6* 8.7*    CBC: Recent Labs  Lab 07/08/21 2016 07/09/21 0336 07/10/21 0309 07/11/21 0211 07/12/21 0308 07/13/21 0257  WBC 6.5 9.0 13.9* 14.1* 11.6* 8.8  NEUTROABS 4.2  --   --   --   --   --   HGB 11.9* 12.5* 9.0* 8.4* 7.3* 8.2*  HCT 37.1* 39.0 28.1* 26.5* 23.5* 25.3*  MCV 92.5 92.0 92.1 93.6 94.8 93.0  PLT 175 177 143* 140* 130* 141*     IMAGING STUDIES DG Chest 1 View  Result Date: 07/08/2021 CLINICAL DATA:  Fall EXAM: CHEST  1 VIEW COMPARISON:  09/08/2017 FINDINGS: Lungs are clear. No pneumothorax or pleural effusion. Cardiac size is within  normal limits. Thoracic aorta is mildly tortuous, accentuated by poor pulmonary insufflation. Pulmonary vascularity is normal. No acute bone abnormality. IMPRESSION: No active disease. Electronically Signed   By: Fidela Salisbury MD   On: 07/08/2021 20:18   CT HEAD WO CONTRAST  Result Date: 07/08/2021 CLINICAL DATA:  [place] EXAM: CT HEAD WITHOUT CONTRAST TECHNIQUE: Contiguous axial images were obtained from the base of the skull through the vertex without intravenous contrast. COMPARISON:  09/10/2017 FINDINGS: Brain: No acute infarct or hemorrhage. Lateral ventricles and midline structures are unremarkable. No acute extra-axial fluid collections. No mass  effect. Vascular: No hyperdense vessel or unexpected calcification. Skull: Normal. Negative for fracture or focal lesion. Sinuses/Orbits: No acute finding. Other: None. IMPRESSION: 1. No acute intracranial process. Electronically Signed   By: Randa Ngo M.D.   On: 07/08/2021 21:38   CT CERVICAL SPINE WO CONTRAST  Result Date: 07/08/2021 CLINICAL DATA:  [place], neck trauma EXAM: CT CERVICAL SPINE WITHOUT CONTRAST TECHNIQUE: Multidetector CT imaging of the cervical spine was performed without intravenous contrast. Multiplanar CT image reconstructions were also generated. COMPARISON:  09/10/2017 FINDINGS: Alignment: Alignment is anatomic. Skull base and vertebrae: No acute fracture. No primary bone lesion or focal pathologic process. Soft tissues and spinal canal: No prevertebral fluid or swelling. No visible canal hematoma. Disc levels: Mild diffuse cervical facet hypertrophy, asymmetric to the left. Disc spaces are relatively well preserved. Minimal symmetrical neural foraminal encroachment from C3-4 through C5-6 due to facet hypertrophy and uncovertebral hypertrophy. Upper chest: Airway is patent.  Lung apices are clear. Other: Reconstructed images demonstrate no additional findings. IMPRESSION: 1. No acute cervical spine fracture. 2. Multilevel spondylosis and facet hypertrophy. Electronically Signed   By: Randa Ngo M.D.   On: 07/08/2021 21:40   Pelvis Portable  Result Date: 07/09/2021 CLINICAL DATA:  Postop right hip replacement EXAM: PORTABLE PELVIS 1-2 VIEWS COMPARISON:  Preoperative imaging. FINDINGS: Right hip arthroplasty in expected alignment. No periprosthetic lucency or fracture. Recent postsurgical change includes air and edema in the soft tissues. IMPRESSION: Right hip arthroplasty without immediate postoperative complication. Electronically Signed   By: Keith Rake M.D.   On: 07/09/2021 15:39   CT Hip Right Wo Contrast  Result Date: 07/08/2021 CLINICAL DATA:  Post fall with right  hip fracture. EXAM: CT OF THE RIGHT HIP WITHOUT CONTRAST TECHNIQUE: Multidetector CT imaging of the right hip was performed according to the standard protocol. Multiplanar CT image reconstructions were also generated. COMPARISON:  Radiograph earlier today. FINDINGS: Bones/Joint/Cartilage Right femoral neck fracture is impacted as well as displaced. There is apex anterior angulation. Fracture extends to the posterior aspect of the head neck junction. Femoral head remains seated. There is mild right hip joint space narrowing. Intact pubic rami. 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 No hip joint effusion. Ligaments Suboptimally assessed by CT. Muscles and Tendons Mild fatty atrophy of gluteus medius and minimus muscle with calcification of the tendon, suggesting remote injury. No intramuscular hematoma. Soft tissues Mild soft tissue edema posterolaterally. The urinary bladder is distended. Colonic diverticulosis partially included. IMPRESSION: 1. Right femoral neck fracture is impacted as well as displaced. Fracture extends to the posterior aspect of the head neck junction. 2. 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 3. Mild fatty atrophy of gluteus medius and minimus muscle with tendinous calcification, suggesting remote injury. Electronically Signed   By: Keith Rake M.D.   On: 07/08/2021 22:10   DG  Knee Complete 4 Views Right  Result Date: 07/08/2021 CLINICAL DATA:  Patient BIB [facility]. Patient fell in [place], possible right hip fx. EXAM: RIGHT KNEE - COMPLETE 4+ VIEW COMPARISON:  None. FINDINGS: No evidence of fracture, dislocation, or joint effusion. No evidence of severe arthropathy. No aggressive appearing focal bone abnormality. Soft tissues are unremarkable. IMPRESSION: No acute displaced fracture or dislocation of the right knee.  Electronically Signed   By: Iven Finn M.D.   On: 07/08/2021 20:21   DG C-Arm 1-60 Min-No Report  Result Date: 07/09/2021 Fluoroscopy was utilized by the requesting physician.  No radiographic interpretation.   DG HIP OPERATIVE UNILAT W OR W/O PELVIS RIGHT  Result Date: 07/09/2021 CLINICAL DATA:  Right anterior hip replaced. EXAM: OPERATIVE RIGHT HIP (WITH PELVIS IF PERFORMED) TECHNIQUE: Fluoroscopic spot image(s) were submitted for interpretation post-operatively. COMPARISON:  None. FINDINGS: Eight fluoroscopic spot views of the pelvis and right hip obtained in the operating room. Interval right hip arthroplasty. Fluoroscopy time 12 seconds. IMPRESSION: Procedural fluoroscopy for right hip arthroplasty Electronically Signed   By: Keith Rake M.D.   On: 07/09/2021 14:42   DG Hip Unilat  With Pelvis 2-3 Views Right  Result Date: 07/08/2021 CLINICAL DATA:  Right hip injury, right hip pain EXAM: DG HIP (WITH OR WITHOUT PELVIS) 2-3V RIGHT COMPARISON:  None. FINDINGS: Single view radiograph pelvis and two view radiograph right hip demonstrate an acute, impacted, subcapital right femoral neck fracture. The femoral head is still seated within the right acetabulum. The right hip joint space is preserved. Limited image of the left hip is unremarkable. The pelvis is intact. IMPRESSION: Acute, impacted right subcapital femoral neck fracture. Electronically Signed   By: Fidela Salisbury MD   On: 07/08/2021 19:39    DISCHARGE EXAMINATION: Vitals:   07/12/21 1433 07/12/21 1626 07/12/21 2110 07/13/21 0531  BP: (!) 102/59 118/65 120/69 126/66  Pulse: 74 75 73 80  Resp: '18 18 16 16  '$ Temp: 98.3 F (36.8 C) 98.3 F (36.8 C) 98.4 F (36.9 C) 97.9 F (36.6 C)  TempSrc: Oral Oral Oral Oral  SpO2: 98% 98% 96% 99%  Weight:      Height:       General appearance: Awake alert.  In no distress Resp: Clear to auscultation bilaterally.  Normal effort Cardio: S1-S2 is normal regular.  No S3-S4.  No rubs  murmurs or bruit GI: Abdomen is soft.  Nontender nondistended.  Bowel sounds are present normal.  No masses organomegaly Extremities: No significant swelling or bruising noted over the right hip   DISPOSITION: SNF  Discharge Instructions     Call MD for:  difficulty breathing, headache or visual disturbances   Complete by: As directed    Call MD for:  extreme fatigue   Complete by: As directed    Call MD for:  persistant dizziness or light-headedness   Complete by: As directed    Call MD for:  persistant nausea and vomiting   Complete by: As directed    Call MD for:  severe uncontrolled pain   Complete by: As directed    Call MD for:  temperature >100.4   Complete by: As directed    Diet - low sodium heart healthy   Complete by: As directed    Discharge instructions   Complete by: As directed    Please review instructions on the discharge summary.  You were cared for by a hospitalist during your hospital stay. If you have any questions about your discharge  medications or the care you received while you were in the hospital after you are discharged, you can call the unit and asked to speak with the hospitalist on call if the hospitalist that took care of you is not available. Once you are discharged, your primary care physician will handle any further medical issues. Please note that NO REFILLS for any discharge medications will be authorized once you are discharged, as it is imperative that you return to your primary care physician (or establish a relationship with a primary care physician if you do not have one) for your aftercare needs so that they can reassess your need for medications and monitor your lab values. If you do not have a primary care physician, you can call [phone] for a physician referral.   Discharge wound care:   Complete by: As directed    Per orthopedics   Increase activity slowly   Complete by: As directed           Allergies as of 07/13/2021   No Known  Allergies      Medication List     STOP taking these medications    aspirin 81 MG EC tablet Replaced by: aspirin 81 MG chewable tablet   atorvastatin 40 MG tablet Commonly known as: LIPITOR   predniSONE 50 MG tablet Commonly known as: DELTASONE   senna 8.6 MG Tabs tablet Commonly known as: SENOKOT   vitamin B-12 1000 MCG tablet Commonly known as: CYANOCOBALAMIN       TAKE these medications    aspirin 81 MG chewable tablet Commonly known as: Aspirin Childrens Chew 1 tablet (81 mg total) by mouth 2 (two) times daily with a meal. Replaces: aspirin 81 MG EC tablet   Combigan 0.2-0.5 % ophthalmic solution Generic drug: brimonidine-timolol Place 1 drop into both eyes 2 (two) times daily.   cyclobenzaprine 10 MG tablet Commonly known as: FLEXERIL Take 10 mg by mouth as needed for muscle spasms.   feeding supplement Liqd Take 237 mLs by mouth 2 (two) times daily between meals.   HYDROcodone-acetaminophen 7.5-325 MG tablet Commonly known as: NORCO Take 1-2 tablets by mouth every 4 (four) hours as needed for severe pain (pain score 7-10). What changed:  how much to take when to take this reasons to take this   ipratropium 0.06 % nasal spray Commonly known as: ATROVENT Place 2 sprays into both nostrils as needed for rhinitis.   metoprolol succinate 25 MG 24 hr tablet Commonly known as: TOPROL-XL Take 12.5 mg by mouth daily.   pantoprazole 40 MG tablet Commonly known as: PROTONIX TAKE 1 TABLET BY MOUTH EVERY DAY   polyethylene glycol 17 g packet Commonly known as: MIRALAX / GLYCOLAX Take 17 g by mouth daily. Start taking on: July 14, 2021   Prenatal Vitamins 28-0.8 MG Tabs Take 1 tablet by mouth daily.   senna-docusate 8.6-50 MG tablet Commonly known as: Senokot-S Take 2 tablets by mouth 2 (two) times daily.   travoprost (benzalkonium) 0.004 % ophthalmic solution Commonly known as: TRAVATAN Place 1 drop into both eyes at bedtime.   zolpidem 10 MG  tablet Commonly known as: AMBIEN Take 1 tablet (10 mg total) by mouth at bedtime as needed for sleep. What changed:  when to take this reasons to take this               Discharge Care Instructions  (From admission, onward)           Start     Ordered  07/13/21 0000  Discharge wound care:       Comments: Per orthopedics   07/13/21 1148              Contact information for follow-up providers     Swinte[place], Aaron Edelman, MD. Schedule an appointment as soon as possible for a visit in 2 week(s).   Specialty: Orthopedic Surgery Why: For wound re-check, For suture removal Contact information: [address] [place] [address] [phone]              Contact information for after-discharge care     Destination     [facility]erred [facility]F .   Service: Skilled Nursing Contact information: [address] [place] [phone]                     TOTAL DISCHARGE TIME: 67 minutes  [place]  [facility] Pager on [email or web address]  07/13/2021, 11:48 AM

## 2021-07-13 NOTE — Progress Notes (Signed)
Patient discharged to [facility] via [facility]. Wife at bedside during transfer. All belongings w/ wife. Report called to Apolonio Schneiders at [facility].

## 2021-07-13 NOTE — Progress Notes (Signed)
Physical Therapy Treatment Patient Details Name: Andrew Pacheco [name]: [id number] DOB: May 30, 1941 Today's Date: 07/13/2021    History of Present Illness Patient is 80 y.o. male s/p Rt THA anterior approach on 07/09/21 secondary to Rt femoral neck fracture from a fall. PMh significant for OA, anemia, GERD, glaucoma, HTN, back pain, migraine.    PT Comments    Pt progressing well with mobility this am with noted improvement in activity tolerance and with decreased assist for most tasks.  Pt performed therex program with assist and up to ambulate increased distance in hall including move to reciprocal gait.   Follow Up Recommendations  SNF     Equipment Recommendations  Rolling walker with 5" wheels;3in1 (PT)    Recommendations for Other Services       Precautions / Restrictions Precautions Precautions: Fall Restrictions Weight Bearing Restrictions: No RLE Weight Bearing: Weight bearing as tolerated    Mobility  Bed Mobility Overal bed mobility: Needs Assistance Bed Mobility: Supine to Sit     Supine to sit: Min assist;HOB elevated     General bed mobility comments: cues for sequence and use of L LE to self assist.  Physical assist to manage R LE and to bring trunk to upright    Transfers Overall transfer level: Needs assistance Equipment used: Rolling walker (2 wheeled) Transfers: Sit to/from Stand Sit to Stand: Min assist         General transfer comment: cues for LE management and use of UEs to self assist  Ambulation/Gait Ambulation/Gait assistance: Min assist;Min guard Gait Distance (Feet): 95 Feet Assistive device: Rolling walker (2 wheeled) Gait Pattern/deviations: Step-to pattern;Decreased step length - left;Decreased weight shift to right Gait velocity: decr   General Gait Details: cues for sequence, posture and position from RW; noted improvement in ability to advance R LE   Stairs             Wheelchair Mobility    Modified Rankin (Stroke  Patients Only)       Balance Overall balance assessment: Needs assistance Sitting-balance support: Feet supported Sitting balance-Leahy Scale: Good     Standing balance support: During functional activity;Bilateral upper extremity supported Standing balance-Leahy Scale: Fair                              Cognition Arousal/Alertness: Awake/alert Behavior During Therapy: WFL for tasks assessed/performed Overall Cognitive Status: Within Functional Limits for tasks assessed                                        Exercises Total Joint Exercises Ankle Circles/Pumps: AROM;Both;20 reps;Seated Quad Sets: AROM;Both;10 reps;Supine Heel Slides: AAROM;Right;Supine;15 reps Hip ABduction/ADduction: AAROM;Right;Supine;15 reps    General Comments        Pertinent Vitals/Pain Pain Assessment: 0-10 Pain Score: 7  Pain Location: Rt hip Pain Descriptors / Indicators: Aching;Discomfort;Guarding;Grimacing;Sore Pain Intervention(s): Limited activity within patient's tolerance;Monitored during session;Premedicated before session;Patient requesting pain meds-RN notified (pt declined ice)    Home Living                      Prior Function            PT Goals (current goals can now be found in the care plan section) Acute Rehab PT Goals Patient Stated Goal: regain independence and strength PT Goal Formulation: With patient Time  For Goal Achievement: 07/17/21 Potential to Achieve Goals: Good Progress towards PT goals: Progressing toward goals    Frequency    Min 5X/week      PT Plan Current plan remains appropriate    Co-evaluation              AM-PAC PT "6 Clicks" Mobility   Outcome Measure  Help needed turning from your back to your side while in a flat bed without using bedrails?: A Little Help needed moving from lying on your back to sitting on the side of a flat bed without using bedrails?: A Little Help needed moving to and from  a bed to a chair (including a wheelchair)?: A Little Help needed standing up from a chair using your arms (e.g., wheelchair or bedside chair)?: A Little Help needed to walk in hospital room?: A Little Help needed climbing 3-5 steps with a railing? : A Lot 6 Click Score: 17    End of Session Equipment Utilized During Treatment: Gait belt Activity Tolerance: Patient tolerated treatment well Patient left: in chair;with call bell/phone within reach;with chair alarm set;with nursing/sitter in room Nurse Communication: Mobility status PT Visit Diagnosis: Muscle weakness (generalized) (M62.81);Difficulty in walking, not elsewhere classified (R26.2);Other symptoms and signs involving the nervous system [id number])     Time: [id number] PT Time Calculation (min) (ACUTE ONLY): 33 min  Charges:  $Gait Training: 8-22 mins $Therapeutic Exercise: 8-22 mins                     [place] Pager [phone] Office [phone]    Jameah Rouser 07/13/2021, 11:35 AM

## 2021-10-16 ENCOUNTER — Telehealth: Payer: Self-pay | Admitting: Physical Medicine and Rehabilitation

## 2021-10-16 NOTE — Telephone Encounter (Signed)
Pt called to set an appt. Please call pt at [phone].

## 2021-10-19 NOTE — Telephone Encounter (Signed)
Called to discuss/ schedule OV. No answer and voicemail not set up.

## 2021-10-23 NOTE — Telephone Encounter (Signed)
Scheduled for OV. 

## 2021-10-28 ENCOUNTER — Encounter: Payer: Self-pay | Admitting: Physical Medicine and Rehabilitation

## 2021-10-28 ENCOUNTER — Other Ambulatory Visit: Payer: Self-pay

## 2021-10-28 ENCOUNTER — Ambulatory Visit: Payer: Medicare Other | Admitting: Physical Medicine and Rehabilitation

## 2021-10-28 VITALS — BP 146/84 | HR 73

## 2021-10-28 DIAGNOSIS — M47816 Spondylosis without myelopathy or radiculopathy, lumbar region: Secondary | ICD-10-CM

## 2021-10-28 DIAGNOSIS — Z96641 Presence of right artificial hip joint: Secondary | ICD-10-CM | POA: Diagnosis not present

## 2021-10-28 DIAGNOSIS — G8929 Other chronic pain: Secondary | ICD-10-CM | POA: Diagnosis not present

## 2021-10-28 DIAGNOSIS — M545 Low back pain, unspecified: Secondary | ICD-10-CM | POA: Diagnosis not present

## 2021-10-28 NOTE — Progress Notes (Signed)
Andrew Pacheco - 80 y.o. male [name] [id number]  Date of birth: 1941/11/07  Office Visit Note: Visit Date: 10/28/2021 PCP: [name], MD Referred by: [name], MD  Subjective: Chief Complaint  Patient presents with   Lower Back - Pain   HPI: Andrew Pacheco is a 80 y.o. male who comes in today for evaluation of chronic, worsening and severe bilateral lower back pain radiating to the hips and buttocks, left greater than right.  Patient is accompanied by his wife during our visit today.  Patient reports chronic pain for several years.  Patient states his pain did increase this past July after a mechanical fall that resulted in closed displaced fracture of the right femoral neck. Patient did have right total hip arthroplasty by Dr. Rod Can in July and reports he is doing well post surgery. Patient reports pain is exacerbated by movement and activity, describes as soreness and aching sensation, currently rates as 7 out of 10. Patient reports some relief of pain with home exercise regimen, heating pad and medications as needed. Patient's lumbar MRI from 2021 exhibits multilevel facet hypertrophy, no high grade spinal canal stenosis noted. Patient had bilateral L4-L5 and L5-S1 radiofrequency ablation in 2021 that did give him significant and sustained pain relief until his accident this past July.  He reported more than 80% relief for the ablation procedure and it did increase his functional activity level.  Patient is a very active person, states he walks everyday and also lifts weights and performs chair squats at home. Patient states he is trying to remain strong and is now using a cane to assist with ambulation and to prevent falls. Patient denies focal weakness, numbness and tingling. Patient denies recent trauma or falls.   Review of Systems  Musculoskeletal:  Positive for back pain.  Neurological:  Negative for tingling, sensory change, focal weakness and weakness.  All other  systems reviewed and are negative. Otherwise per HPI.  Assessment & Plan: Visit Diagnoses:    ICD-10-CM   1. Spondylosis without myelopathy or radiculopathy, lumbar region  M47.816     2. Chronic bilateral low back pain without sciatica  M54.50    G89.29     3. Facet hypertrophy of lumbar region  M47.816     4. S/P hip replacement, right  Z96.641        Plan: Findings:  Chronic, worsening and severe bilateral lower back pain radiating to the hips and buttocks, left greater than right.  Patient continues to have excruciating pain despite good conservative therapy such as home exercise regimen, heating pad and medications as needed.  Patient did get good and sustained pain relief with bilateral L4-L5 and L5-S1 radiofrequency ablation in 2021.  We believe the next step would be to repeat bilateral L4-L5 and L5-S1 radiofrequency ablation under fluoroscopic guidance.  We did speak with patient in detail today regarding home exercise regimen and the importance of staying strong.  We recommend that he continue with his current regimen at home as tolerated.  We feel that we can get patient in quickly for his injections.  Patient instructed to continue using cane to assist with ambulation and to prevent falls.  No red flag symptoms noted upon exam today.   Meds & Orders: No orders of the defined types were placed in this encounter.  No orders of the defined types were placed in this encounter.   Follow-up: Return for Bilateral L4-L5 and L5-S1 radiofrequency ablation.   Procedures: No procedures  performed      Clinical History: MRI LUMBAR SPINE WITHOUT CONTRAST   TECHNIQUE: Multiplanar, multisequence MR imaging of the lumbar spine was performed. No intravenous contrast was administered.   COMPARISON:  08/27/2008 CT L-spine. 07/15/2018 MRI lumbar spine and prior.   FINDINGS: Segmentation:  Standard.   Alignment: Straightening of lordosis. Minimal grade 1 L5-S1 anterolisthesis.    Vertebrae:  Vertebral body heights are preserved.  L3 hemangioma.   Conus medullaris and cauda equina: Conus extends to the L1 level. Conus and cauda equina appear normal.   Disc levels: Multilevel desiccation and disc space loss.   L1-2: Minimal disc bulge with superimposed superiorly migrated left paracentral extrusion, unchanged. Patent spinal canal and neural foramen.   L2-3: No significant disc bulge. Bilateral facet degenerative spurring. Patent spinal canal and neural foramen.   L3-4: Mild disc bulge, prominent ligamentum flavum and bilateral facet degenerative spurring. Mild spinal canal and bilateral neural foraminal narrowing.   L4-5: Mild disc bulge, ligamentum flavum and bilateral facet hypertrophy. Patent spinal canal. Mild bilateral neural foraminal narrowing.   L5-S1: Disc bulge grazing the exiting L5 nerve root. Bilateral facet hypertrophy. Patent spinal canal. Mild bilateral neural foraminal narrowing.   Paraspinal and other soft tissues: Negative.   IMPRESSION: Multilevel spondylosis, grossly unchanged.   Superiorly migrated left L1-2 paracentral extrusion is similar to prior exam. Patent spinal canal and neural foramen at this level.   Mild L3-4 spinal canal and mild L3-S1 bilateral neural foraminal narrowing.     Electronically Signed   By: Primitivo Gauze M.D.   On: 11/10/2020 13:46   He reports that he has never smoked. He has never used smokeless tobacco. No results for input(s): HGBA1C, LABURIC in the last 8760 hours.  Objective:  VS:  HT:    WT:   BMI:     BP:(!) 146/84  HR:73bpm  TEMP: ( )  RESP:  Physical Exam Vitals and nursing note reviewed.  HENT:     Head: Normocephalic and atraumatic.     Right Ear: External ear normal.     Left Ear: External ear normal.     Nose: Nose normal.     Mouth/Throat:     Mouth: Mucous membranes are moist.  Eyes:     Extraocular Movements: Extraocular movements intact.  Cardiovascular:      Rate and Rhythm: Normal rate.     Pulses: Normal pulses.  Pulmonary:     Effort: Pulmonary effort is normal.  Abdominal:     General: Abdomen is flat. There is no distension.  Musculoskeletal:        General: Tenderness present.     Cervical back: Normal range of motion.     Comments: Pt is slow to rise from seated position to standing. Concordant low back pain with facet loading, lumbar spine extension and rotation. Strong distal strength without clonus, no pain upon palpation of greater trochanters. Sensation intact bilaterally. Ambulates with cane, gait unsteady.     Skin:    General: Skin is warm and dry.     Capillary Refill: Capillary refill takes less than 2 seconds.  Neurological:     Mental Status: He is alert.     Gait: Gait abnormal.  Psychiatric:        Mood and Affect: Mood normal.        Behavior: Behavior normal.    Ortho Exam  Imaging: No results found.  Past Medical/Family/Surgical/Social History: Medications & Allergies reviewed per EMR, new medications updated. Patient Active  Problem List   Diagnosis Date Noted   Hip fracture ([place]) 07/08/2021   Vasodepressor syncope 09/11/2017   Vasovagal near syncope 09/11/2017   Low vitamin B12 level 09/10/2017   HTN (hypertension) 09/10/2017   Malnutrition of moderate degree 09/10/2017   Gastroesophageal reflux disease    AKI (acute kidney injury) ([place])    Glaucoma    Near syncope 09/08/2017   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   ANEMIA-UNSPECIFIED 09/26/2009   CONSTIPATION 09/26/2009   NAUSEA 09/26/2009   ABDOMINAL PAIN -GENERALIZED 09/26/2009   Past Medical History:  Diagnosis Date   Anemia    Arthritis    "both hips, back" (09/09/2017)   Chronic back pain    "middle and lower back" (09/09/2017)   Colon polyps    Daily headache    Diverticulosis    GERD (gastroesophageal reflux disease)    Glaucoma, both eyes    H pylori ulcer    Hard of hearing    HTN (hypertension)    Insomnia    due to disc   Lumbar disc  disease    Migraine    "q 3-4 months" (09/09/2017)   Seasonal allergies    Family History  Problem Relation Age of Onset   Heart disease Father    Emphysema Father    Diverticulosis Father    Cancer Mother        gallbladder-mets   Arthritis Mother    Liver cancer Mother    Past Surgical History:  Procedure Laterality Date   APPENDECTOMY     CYST EXCISION Right ~ 1971   knee   ESOPHAGOGASTRODUODENOSCOPY N/A 01/15/2018   Procedure: ESOPHAGOGASTRODUODENOSCOPY (EGD);  Surgeon: Ladene Artist, MD;  Location: [facility] ENDOSCOPY;  Service: Endoscopy;  Laterality: N/A;   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   Procedure: TOTAL HIP ARTHROPLASTY ANTERIOR APPROACH;  Surgeon: Rod Can, MD;  Location: [facility] ORS;  Service: Orthopedics;  Laterality: Right;   Social History   Occupational History   Occupation: security guard - [employer]  Tobacco Use   Smoking status: Never   Smokeless tobacco: Never  Vaping Use   Vaping Use: Never used  Substance and Sexual Activity   Alcohol use: Yes    Comment: 09/09/2017 "couple glasses of wine q 6 months"   Drug use: No   Sexual activity: Not on file

## 2021-10-28 NOTE — Progress Notes (Signed)
Pt state lower back pain, mostly on his left side. Pt state walking and standing makes the pain worse. Pt state he takes pain meds to help ease his pain and uses heat.  Numeric Pain Rating Scale and Functional Assessment Average Pain 9 Pain Right Now 3 My pain is constant, sharp, burning, dull, stabbing, and aching Pain is worse with: walking, standing, and some activites Pain improves with: heat/ice, medication, and injections   In the last MONTH (on 0-10 scale) has pain interfered with the following?  1. General activity like being  able to carry out your everyday physical activities such as walking, climbing stairs, carrying groceries, or moving a chair?  Rating(7)  2. Relation with others like being able to carry out your usual social activities and roles such as  activities at home, at work and in your community. Rating(8)  3. Enjoyment of life such that you have  been bothered by emotional problems such as feeling anxious, depressed or irritable?  Rating(9)

## 2021-11-16 ENCOUNTER — Other Ambulatory Visit: Payer: Self-pay | Admitting: Physical Medicine and Rehabilitation

## 2021-11-16 DIAGNOSIS — M47816 Spondylosis without myelopathy or radiculopathy, lumbar region: Secondary | ICD-10-CM

## 2021-11-17 ENCOUNTER — Other Ambulatory Visit: Payer: Self-pay | Admitting: Internal Medicine

## 2021-11-17 DIAGNOSIS — K259 Gastric ulcer, unspecified as acute or chronic, without hemorrhage or perforation: Secondary | ICD-10-CM

## 2021-12-10 ENCOUNTER — Encounter: Payer: Medicare Other | Admitting: Physical Medicine and Rehabilitation

## 2022-01-08 ENCOUNTER — Telehealth: Payer: Self-pay | Admitting: Physical Medicine and Rehabilitation

## 2022-01-08 NOTE — Telephone Encounter (Signed)
Spoke with patient and wife via telephone this morning. Patient continues to have severe bilateral lower back pain, currently denies any radicular symptoms. Patient is scheduled for elbow surgery (cyst removal) on 01/14/2022 and would like to wait several weeks after surgical procedure to repeat radiofrequency ablation. Patient states he will call us to schedule RFA after he has been cleared by surgeon post procedure.

## 2022-01-25 ENCOUNTER — Encounter: Payer: Medicare Other | Admitting: Physical Medicine and Rehabilitation

## 2022-07-09 ENCOUNTER — Other Ambulatory Visit: Payer: Self-pay | Admitting: Internal Medicine

## 2022-07-09 DIAGNOSIS — K259 Gastric ulcer, unspecified as acute or chronic, without hemorrhage or perforation: Secondary | ICD-10-CM

## 2022-10-07 ENCOUNTER — Other Ambulatory Visit: Payer: Self-pay | Admitting: Internal Medicine

## 2022-10-07 DIAGNOSIS — K259 Gastric ulcer, unspecified as acute or chronic, without hemorrhage or perforation: Secondary | ICD-10-CM

## 2022-10-25 ENCOUNTER — Telehealth: Payer: Self-pay | Admitting: Physical Medicine and Rehabilitation

## 2022-10-25 NOTE — Telephone Encounter (Signed)
Pt called requesting to repeat last visit procedure with Dr Ernestina Patches. Please call pt at [phone].

## 2022-10-27 NOTE — Telephone Encounter (Signed)
Spoke with patient's wife and scheduled ov for 10/28/22

## 2022-10-28 ENCOUNTER — Ambulatory Visit: Payer: Medicare Other | Admitting: Physical Medicine and Rehabilitation

## 2022-10-28 ENCOUNTER — Encounter: Payer: Self-pay | Admitting: Physical Medicine and Rehabilitation

## 2022-10-28 DIAGNOSIS — G8929 Other chronic pain: Secondary | ICD-10-CM

## 2022-10-28 DIAGNOSIS — R269 Unspecified abnormalities of gait and mobility: Secondary | ICD-10-CM | POA: Diagnosis not present

## 2022-10-28 DIAGNOSIS — Z9181 History of falling: Secondary | ICD-10-CM | POA: Diagnosis not present

## 2022-10-28 DIAGNOSIS — M47816 Spondylosis without myelopathy or radiculopathy, lumbar region: Secondary | ICD-10-CM

## 2022-10-28 DIAGNOSIS — M545 Low back pain, unspecified: Secondary | ICD-10-CM | POA: Diagnosis not present

## 2022-10-28 MED ORDER — DIAZEPAM 5 MG PO TABS: ORAL_TABLET | ORAL | 0 refills | Status: AC

## 2022-10-28 NOTE — Progress Notes (Signed)
Andrew Pacheco - 81 y.o. male [name] [id number]  Date of birth: Feb 02, 1941  Office Visit Note: Visit Date: 10/28/2022 PCP: Donnajean Lopes, MD Referred by: Donnajean Lopes, MD  Subjective: Chief Complaint  Patient presents with   Lower Back - Pain   HPI: Andrew Pacheco is a 81 y.o. male who comes in today for evaluation of chronic, worsening and severe bilateral lower back pain. Pain ongoing for several years and is exacerbated by movement and activity. He describes pain as sore and aching, currently rates as 7 out of 10. Some relief of pain with home exercise regimen, rest and use of medications. Patient is currently taking Norco for moderate/severe pain that is prescribed by his primary care provider Dr. [name]. Lumbar MRI imaging from 2021 exhibits multilevel facet hypertrophy, no high grade spinal canal stenosis noted. Patient underwent bilateral L4-L5 and L5-S1 radiofrequency ablation in our office in 2021 that provided significant and sustained pain relief of pain for over 1 year. More than 80% relief of pain with the ablation and reports increased functional activity level post procedure. His pain increased this past July after mechanical fall resulting in closed displaced fracture of the right femoral neck, total right hip arthroplasty performed by Dr. Rod Can. Due to fall and surgery patient had to postpone previously scheduled radiofrequency ablation procedure. Patient is a very active person, states he walks everyday and also lifts weights and performs chair squats at home. Patient states he is trying to remain strong and is now using  cane to assist with ambulation and to prevent falls. Patient denies focal weakness, numbness and tingling. Patient denies recent trauma or falls.        Review of Systems  Musculoskeletal:  Positive for back pain.  Neurological:  Negative for tingling, sensory change, focal weakness and weakness.  All other systems reviewed and are  negative.  Otherwise per HPI.  Assessment & Plan: Visit Diagnoses:    ICD-10-CM   1. Chronic bilateral low back pain without sciatica  M54.50 Ambulatory referral to Physical Medicine Rehab   G89.29     2. Spondylosis without myelopathy or radiculopathy, lumbar region  M47.816 Ambulatory referral to Physical Medicine Rehab    3. Facet hypertrophy of lumbar region  M47.816 Ambulatory referral to Physical Medicine Rehab    4. Gait abnormality  R26.9 Ambulatory referral to Physical Medicine Rehab    5. At high risk for falls  Z91.81 Ambulatory referral to Physical Medicine Rehab       Plan: Findings:  Chronic, worsening and severe bilateral axial back pain. Patient continues to have severe pain despite good conservative therapies such as home exercise regimen, rest and use of medications. Patients clinical presentation and exam are consistent with facet mediated pain. He does have pain with lumbar extension on exam today. Next step is to perform bilateral L4-L5 and L5-S1 radiofrequency ablation under fluoroscopic guidance. We can repeat ablation infrequently as this procedure typically provides pain relief for 8-12 months. Patient did voice concerns related to anxiety surrounding procedure, I did prescribe pre-procedure Valium for him to take on day of injection. Patient encouraged to remain active as tolerated, he is instructed to continue chronic pain management with Dr. Philip Aspen. No red flag symptoms noted upon exam today.     Meds & Orders:  Meds ordered this encounter  Medications   diazepam (VALIUM) 5 MG tablet    Sig: Take one tablet by mouth with food one hour prior to procedure.  May repeat 30 minutes prior if needed.    Dispense:  2 tablet    Refill:  0    Orders Placed This Encounter  Procedures   Ambulatory referral to Physical Medicine Rehab    Follow-up: Return for Bilateral L4-L5 and L5-S1 radiofrequency ablation.   Procedures: No procedures performed      Clinical  History: EXAM: MRI LUMBAR SPINE WITHOUT CONTRAST   TECHNIQUE: Multiplanar, multisequence MR imaging of the lumbar spine was performed. No intravenous contrast was administered.   COMPARISON:  08/27/2008 CT L-spine. 07/15/2018 MRI lumbar spine and prior.   FINDINGS: Segmentation:  Standard.   Alignment: Straightening of lordosis. Minimal grade 1 L5-S1 anterolisthesis.   Vertebrae:  Vertebral body heights are preserved.  L3 hemangioma.   Conus medullaris and cauda equina: Conus extends to the L1 level. Conus and cauda equina appear normal.   Disc levels: Multilevel desiccation and disc space loss.   L1-2: Minimal disc bulge with superimposed superiorly migrated left paracentral extrusion, unchanged. Patent spinal canal and neural foramen.   L2-3: No significant disc bulge. Bilateral facet degenerative spurring. Patent spinal canal and neural foramen.   L3-4: Mild disc bulge, prominent ligamentum flavum and bilateral facet degenerative spurring. Mild spinal canal and bilateral neural foraminal narrowing.   L4-5: Mild disc bulge, ligamentum flavum and bilateral facet hypertrophy. Patent spinal canal. Mild bilateral neural foraminal narrowing.   L5-S1: Disc bulge grazing the exiting L5 nerve root. Bilateral facet hypertrophy. Patent spinal canal. Mild bilateral neural foraminal narrowing.   Paraspinal and other soft tissues: Negative.   IMPRESSION: Multilevel spondylosis, grossly unchanged.   Superiorly migrated left L1-2 paracentral extrusion is similar to prior exam. Patent spinal canal and neural foramen at this level.   Mild L3-4 spinal canal and mild L3-S1 bilateral neural foraminal narrowing.     Electronically Signed   By: Primitivo Gauze M.D.   On: 11/10/2020 13:46   He reports that he has never smoked. He has never used smokeless tobacco. No results for input(s): "HGBA1C", "LABURIC" in the last 8760 hours.  Objective:  VS:  HT:    WT:   BMI:      BP:   HR: bpm  TEMP: ( )  RESP:  Physical Exam Vitals and nursing note reviewed.  HENT:     Head: Normocephalic and atraumatic.     Right Ear: External ear normal.     Left Ear: External ear normal.     Nose: Nose normal.     Mouth/Throat:     Mouth: Mucous membranes are moist.  Eyes:     Extraocular Movements: Extraocular movements intact.  Cardiovascular:     Rate and Rhythm: Normal rate.     Pulses: Normal pulses.  Pulmonary:     Effort: Pulmonary effort is normal.  Abdominal:     General: Abdomen is flat. There is no distension.  Musculoskeletal:        General: Tenderness present.     Cervical back: Normal range of motion.     Comments: Pt is slow to rise from seated position to standing. Concordant low back pain with facet loading, lumbar spine extension and rotation. Strong distal strength without clonus, no pain upon palpation of greater trochanters. Sensation intact bilaterally. Ambulates with cane, gait unsteady.   Skin:    General: Skin is warm and dry.     Capillary Refill: Capillary refill takes less than 2 seconds.  Neurological:     Mental Status: He is alert and oriented  to person, place, and time.     Gait: Gait abnormal.  Psychiatric:        Mood and Affect: Mood normal.        Behavior: Behavior normal.     Ortho Exam  Imaging: No results found.  Past Medical/Family/Surgical/Social History: Medications & Allergies reviewed per EMR, new medications updated. Patient Active Problem List   Diagnosis Date Noted   Hip fracture ([place]) 07/08/2021   Vasodepressor syncope 09/11/2017   Vasovagal near syncope 09/11/2017   Low vitamin B12 level 09/10/2017   HTN (hypertension) 09/10/2017   Malnutrition of moderate degree 09/10/2017   Gastroesophageal reflux disease    AKI (acute kidney injury) ([place])    Glaucoma    Near syncope 09/08/2017   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   ANEMIA-UNSPECIFIED 09/26/2009   CONSTIPATION 09/26/2009   NAUSEA 09/26/2009   ABDOMINAL  PAIN -GENERALIZED 09/26/2009   Past Medical History:  Diagnosis Date   Anemia    Arthritis    "both hips, back" (09/09/2017)   Chronic back pain    "middle and lower back" (09/09/2017)   Colon polyps    Daily headache    Diverticulosis    GERD (gastroesophageal reflux disease)    Glaucoma, both eyes    H pylori ulcer    Hard of hearing    HTN (hypertension)    Insomnia    due to disc   Lumbar disc disease    Migraine    "q 3-4 months" (09/09/2017)   Seasonal allergies    Family History  Problem Relation Age of Onset   Heart disease Father    Emphysema Father    Diverticulosis Father    Cancer Mother        gallbladder-mets   Arthritis Mother    Liver cancer Mother    Past Surgical History:  Procedure Laterality Date   APPENDECTOMY     CYST EXCISION Right ~ 1971   knee   ESOPHAGOGASTRODUODENOSCOPY N/A 01/15/2018   Procedure: ESOPHAGOGASTRODUODENOSCOPY (EGD);  Surgeon: Ladene Artist, MD;  Location: [facility] ENDOSCOPY;  Service: Endoscopy;  Laterality: N/A;   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   Procedure: TOTAL HIP ARTHROPLASTY ANTERIOR APPROACH;  Surgeon: Rod Can, MD;  Location: [facility] ORS;  Service: Orthopedics;  Laterality: Right;   Social History   Occupational History   Occupation: security guard - [employer]  Tobacco Use   Smoking status: Never   Smokeless tobacco: Never  Vaping Use   Vaping Use: Never used  Substance and Sexual Activity   Alcohol use: Yes    Comment: 09/09/2017 "couple glasses of wine q 6 months"   Drug use: No   Sexual activity: Not on file

## 2022-10-28 NOTE — Progress Notes (Signed)
Numeric Pain Rating Scale and Functional Assessment Average Pain 9   In the last MONTH (on 0-10 scale) has pain interfered with the following?  1. General activity like being  able to carry out your everyday physical activities such as walking, climbing stairs, carrying groceries, or moving a chair?  Rating(9)   Low back pain that goes across back, but worse on left. Pain is exactly how it was the last time RFA was done.

## 2022-12-06 ENCOUNTER — Ambulatory Visit: Payer: Self-pay

## 2022-12-06 ENCOUNTER — Ambulatory Visit: Payer: Medicare Other | Admitting: Physical Medicine and Rehabilitation

## 2022-12-06 VITALS — BP 144/83 | HR 61

## 2022-12-06 DIAGNOSIS — M5416 Radiculopathy, lumbar region: Secondary | ICD-10-CM | POA: Diagnosis not present

## 2022-12-06 MED ORDER — METHYLPREDNISOLONE ACETATE 80 MG/ML IJ SUSP
80.0000 mg | Freq: Once | INTRAMUSCULAR | Status: AC
  Administered 2022-12-06: 80 mg

## 2022-12-06 NOTE — Patient Instructions (Signed)

## 2022-12-06 NOTE — Progress Notes (Unsigned)
Numeric Pain Rating Scale and Functional Assessment Average Pain 6   In the last MONTH (on 0-10 scale) has pain interfered with the following?  1. General activity like being  able to carry out your everyday physical activities such as walking, climbing stairs, carrying groceries, or moving a chair?  Rating(7)   +Driver, -BT, -Dye Allergies.  Lower back pain, worse on left

## 2022-12-09 NOTE — Procedures (Signed)
Lumbar Facet Joint Nerve Denervation  Patient: Andrew Pacheco      Date of Birth: Aug 25, 1941 [name]: [id number] PCP: Donnajean Lopes, MD      Visit Date: 12/06/2022   Universal Protocol:    Date/Time: 12/21/236:08 AM  Consent Given By: the patient  Position: PRONE  Additional Comments: Vital signs were monitored before and after the procedure. Patient was prepped and draped in the usual sterile fashion. The correct patient, procedure, and site was verified.   Injection Procedure Details:   Procedure diagnoses:  1. Lumbar radiculopathy      Meds Administered:  Meds ordered this encounter  Medications   methylPREDNISolone acetate (DEPO-MEDROL) injection 80 mg     Laterality: Left  Location/Site:  L4-L5, L3 and L4 medial branches and L5-S1, L4 medial branch and L5 dorsal ramus  Needle: 18 ga.,  55m active tip, 103mRF Cannula  Needle Placement: Along juncture of superior articular process and transverse pocess  Findings:  -Comments:  Procedure Details: For each desired target nerve, the corresponding transverse process (sacral ala for the L5 dorsal rami) was identified and the fluoroscope was positioned to square off the endplates of the corresponding vertebral body to achieve a true AP midline view.  The beam was then obliqued 15 to 20 degrees and caudally tilted 15 to 20 degrees to line up a trajectory along the target nerves. The skin over the target of the junction of superior articulating process and transverse process (sacral ala for the L5 dorsal rami) was infiltrated with 57m55mf 1% Lidocaine without Epinephrine.  The 18 gauge 35m23mtive tip outer cannula was advanced in trajectory view to the target.  This procedure was repeated for each target nerve.  Then, for all levels, the outer cannula placement was fine-tuned and the position was then confirmed with bi-planar imaging.    Test stimulation was done both at sensory and motor levels to ensure there was no  radicular stimulation. The target tissues were then infiltrated with 1 ml of 1% Lidocaine without Epinephrine. Subsequently, a percutaneous neurotomy was carried out for 90 seconds at 80 degrees Celsius.  After the completion of the lesion, 1 ml of injectate was delivered. It was then repeated for each facet joint nerve mentioned above. Appropriate radiographs were obtained to verify the probe placement during the neurotomy.   Additional Comments:  No complications occurred Dressing: 2 x 2 sterile gauze and Band-Aid    Post-procedure details: Patient was observed during the procedure. Post-procedure instructions were reviewed.  Patient left the clinic in stable condition.

## 2022-12-09 NOTE — Progress Notes (Signed)
Andrew Pacheco - 81 y.o. male [name] [id number]  Date of birth: [birth year]  Office Visit Note: Visit Date: 12/06/2022 PCP: Donnajean Lopes, MD Referred by: Donnajean Lopes, MD  Subjective: Chief Complaint  Patient presents with   Lower Back - Pain   HPI:  Andrew Pacheco is a 81 y.o. male who comes in todayfor planned repeat radiofrequency ablation of the Left L4-5 and L5-S1  Lumbar facet joints. This would be ablation of the corresponding medial branches and/or dorsal rami.  Patient has had double diagnostic blocks with more than 70% relief.  Subsequent ablation gave them more than 6 months of over 60% relief.  They have had chronic back pain for quite some time, more than 3 months, which has been an ongoing situation with recalcitrant axial back pain.  They have no radicular pain.  Their axial pain is worse with standing and ambulating and on exam today with facet loading.  They have had physical therapy as well as home exercise program.  The imaging noted in the chart below indicated facet pathology. Accordingly they meet all the criteria and qualification for for radiofrequency ablation and we are going to complete this today hopefully for more longer term relief as part of comprehensive management program.   ROS Otherwise per HPI.  Assessment & Plan: Visit Diagnoses:    ICD-10-CM   1. Lumbar radiculopathy  M54.16 XR C-ARM NO REPORT    Radiofrequency,Lumbar    methylPREDNISolone acetate (DEPO-MEDROL) injection 80 mg      Plan: No additional findings.   Meds & Orders:  Meds ordered this encounter  Medications   methylPREDNISolone acetate (DEPO-MEDROL) injection 80 mg    Orders Placed This Encounter  Procedures   Radiofrequency,Lumbar   XR C-ARM NO REPORT    Follow-up: Return if symptoms worsen or fail to improve.   Procedures: No procedures performed  Lumbar Facet Joint Nerve Denervation  Patient: Andrew Pacheco      Date of Birth: 1941/01/08 [name]: [id number] PCP:  Donnajean Lopes, MD      Visit Date: 12/06/2022   Universal Protocol:    Date/Time: 12/21/236:08 AM  Consent Given By: the patient  Position: PRONE  Additional Comments: Vital signs were monitored before and after the procedure. Patient was prepped and draped in the usual sterile fashion. The correct patient, procedure, and site was verified.   Injection Procedure Details:   Procedure diagnoses:  1. Lumbar radiculopathy      Meds Administered:  Meds ordered this encounter  Medications   methylPREDNISolone acetate (DEPO-MEDROL) injection 80 mg     Laterality: Left  Location/Site:  L4-L5, L3 and L4 medial branches and L5-S1, L4 medial branch and L5 dorsal ramus  Needle: 18 ga.,  72m active tip, [id number] Cannula  Needle Placement: Along juncture of superior articular process and transverse pocess  Findings:  -Comments:  Procedure Details: For each desired target nerve, the corresponding transverse process (sacral ala for the L5 dorsal rami) was identified and the fluoroscope was positioned to square off the endplates of the corresponding vertebral body to achieve a true AP midline view.  The beam was then obliqued 15 to 20 degrees and caudally tilted 15 to 20 degrees to line up a trajectory along the target nerves. The skin over the target of the junction of superior articulating process and transverse process (sacral ala for the L5 dorsal rami) was infiltrated with 62m2mf 1% Lidocaine without Epinephrine.  The 18 gauge 64m76mtive  tip outer cannula was advanced in trajectory view to the target.  This procedure was repeated for each target nerve.  Then, for all levels, the outer cannula placement was fine-tuned and the position was then confirmed with bi-planar imaging.    Test stimulation was done both at sensory and motor levels to ensure there was no radicular stimulation. The target tissues were then infiltrated with 1 ml of 1% Lidocaine without Epinephrine.  Subsequently, a percutaneous neurotomy was carried out for 90 seconds at 80 degrees Celsius.  After the completion of the lesion, 1 ml of injectate was delivered. It was then repeated for each facet joint nerve mentioned above. Appropriate radiographs were obtained to verify the probe placement during the neurotomy.   Additional Comments:  No complications occurred Dressing: 2 x 2 sterile gauze and Band-Aid    Post-procedure details: Patient was observed during the procedure. Post-procedure instructions were reviewed.  Patient left the clinic in stable condition.      Clinical History: EXAM: MRI LUMBAR SPINE WITHOUT CONTRAST   TECHNIQUE: Multiplanar, multisequence MR imaging of the lumbar spine was performed. No intravenous contrast was administered.   COMPARISON:  08/27/2008 CT L-spine. 07/15/2018 MRI lumbar spine and prior.   FINDINGS: Segmentation:  Standard.   Alignment: Straightening of lordosis. Minimal grade 1 L5-S1 anterolisthesis.   Vertebrae:  Vertebral body heights are preserved.  L3 hemangioma.   Conus medullaris and cauda equina: Conus extends to the L1 level. Conus and cauda equina appear normal.   Disc levels: Multilevel desiccation and disc space loss.   L1-2: Minimal disc bulge with superimposed superiorly migrated left paracentral extrusion, unchanged. Patent spinal canal and neural foramen.   L2-3: No significant disc bulge. Bilateral facet degenerative spurring. Patent spinal canal and neural foramen.   L3-4: Mild disc bulge, prominent ligamentum flavum and bilateral facet degenerative spurring. Mild spinal canal and bilateral neural foraminal narrowing.   L4-5: Mild disc bulge, ligamentum flavum and bilateral facet hypertrophy. Patent spinal canal. Mild bilateral neural foraminal narrowing.   L5-S1: Disc bulge grazing the exiting L5 nerve root. Bilateral facet hypertrophy. Patent spinal canal. Mild bilateral neural foraminal narrowing.    Paraspinal and other soft tissues: Negative.   IMPRESSION: Multilevel spondylosis, grossly unchanged.   Superiorly migrated left L1-2 paracentral extrusion is similar to prior exam. Patent spinal canal and neural foramen at this level.   Mild L3-4 spinal canal and mild L3-S1 bilateral neural foraminal narrowing.     Electronically Signed   By: Primitivo Gauze M.D.   On: 11/10/2020 13:46     Objective:  VS:  HT:    WT:   BMI:     BP:(!) 144/83  HR:61bpm  TEMP: ( )  RESP:  Physical Exam Vitals and nursing note reviewed.  Constitutional:      General: He is not in acute distress.    Appearance: Normal appearance. He is not ill-appearing.  HENT:     Head: Normocephalic and atraumatic.     Right Ear: External ear normal.     Left Ear: External ear normal.     Nose: No congestion.  Eyes:     Extraocular Movements: Extraocular movements intact.  Cardiovascular:     Rate and Rhythm: Normal rate.     Pulses: Normal pulses.  Pulmonary:     Effort: Pulmonary effort is normal. No respiratory distress.  Abdominal:     General: There is no distension.     Palpations: Abdomen is soft.  Musculoskeletal:  General: No tenderness or signs of injury.     Cervical back: Neck supple.     Right lower leg: No edema.     Left lower leg: No edema.     Comments: Patient has good distal strength without clonus.  Skin:    Findings: No erythema or rash.  Neurological:     General: No focal deficit present.     Mental Status: He is alert and oriented to person, place, and time.     Sensory: No sensory deficit.     Motor: No weakness or abnormal muscle tone.     Coordination: Coordination normal.  Psychiatric:        Mood and Affect: Mood normal.        Behavior: Behavior normal.      Imaging: No results found.

## 2022-12-21 ENCOUNTER — Ambulatory Visit: Payer: Medicare Other | Admitting: Physical Medicine and Rehabilitation

## 2022-12-21 ENCOUNTER — Ambulatory Visit: Payer: Self-pay

## 2022-12-21 VITALS — BP 118/70 | HR 69

## 2022-12-21 DIAGNOSIS — M47816 Spondylosis without myelopathy or radiculopathy, lumbar region: Secondary | ICD-10-CM

## 2022-12-21 MED ORDER — METHYLPREDNISOLONE ACETATE 80 MG/ML IJ SUSP
80.0000 mg | Freq: Once | INTRAMUSCULAR | Status: AC
  Administered 2022-12-21: 80 mg

## 2022-12-21 NOTE — Progress Notes (Signed)
Functional Pain Scale - descriptive words and definitions  Uncomfortable (3)  Pain is present but can complete all ADL's/sleep is slightly affected and passive distraction only gives marginal relief. Mild range order  Average Pain 6   +Driver, -BT, -Dye Allergies.  Lower back pain on right that radiates into right hip

## 2022-12-21 NOTE — Patient Instructions (Signed)

## 2022-12-26 NOTE — Procedures (Signed)
Lumbar Facet Joint Nerve Denervation  Patient: Andrew Pacheco      Date of Birth: [birth year] [name]: [id number] PCP: Donnajean Lopes, MD      Visit Date: 12/21/2022   Universal Protocol:    Date/Time: 12/26/2408:41 AM  Consent Given By: the patient  Position: PRONE  Additional Comments: Vital signs were monitored before and after the procedure. Patient was prepped and draped in the usual sterile fashion. The correct patient, procedure, and site was verified.   Injection Procedure Details:   Procedure diagnoses:  1. Spondylosis without myelopathy or radiculopathy, lumbar region      Meds Administered:  Meds ordered this encounter  Medications   methylPREDNISolone acetate (DEPO-MEDROL) injection 80 mg     Laterality: Right  Location/Site:  L4-L5, L3 and L4 medial branches and L5-S1, L4 medial branch and L5 dorsal ramus  Needle: 18 ga.,  57m active tip, 1028mRF Cannula  Needle Placement: Along juncture of superior articular process and transverse pocess  Findings:  -Comments:  Procedure Details: For each desired target nerve, the corresponding transverse process (sacral ala for the L5 dorsal rami) was identified and the fluoroscope was positioned to square off the endplates of the corresponding vertebral body to achieve a true AP midline view.  The beam was then obliqued 15 to 20 degrees and caudally tilted 15 to 20 degrees to line up a trajectory along the target nerves. The skin over the target of the junction of superior articulating process and transverse process (sacral ala for the L5 dorsal rami) was infiltrated with 68m34mf 1% Lidocaine without Epinephrine.  The 18 gauge 51m58mtive tip outer cannula was advanced in trajectory view to the target.  This procedure was repeated for each target nerve.  Then, for all levels, the outer cannula placement was fine-tuned and the position was then confirmed with bi-planar imaging.    Test stimulation was done both at sensory  and motor levels to ensure there was no radicular stimulation. The target tissues were then infiltrated with 1 ml of 1% Lidocaine without Epinephrine. Subsequently, a percutaneous neurotomy was carried out for 90 seconds at 80 degrees Celsius.  After the completion of the lesion, 1 ml of injectate was delivered. It was then repeated for each facet joint nerve mentioned above. Appropriate radiographs were obtained to verify the probe placement during the neurotomy.   Additional Comments:  No complications occurred Dressing: 2 x 2 sterile gauze and Band-Aid    Post-procedure details: Patient was observed during the procedure. Post-procedure instructions were reviewed.  Patient left the clinic in stable condition.

## 2022-12-26 NOTE — Progress Notes (Signed)
Andrew Pacheco - 82 y.o. male [name] [id number]  Date of birth: 1941-11-12  Office Visit Note: Visit Date: 12/21/2022 PCP: Donnajean Lopes, MD Referred by: Donnajean Lopes, MD  Subjective: Chief Complaint  Patient presents with   Lower Back - Pain   HPI:  Andrew Pacheco is a 82 y.o. male who comes in todayfor planned repeat radiofrequency ablation of the Right L4-5, L5-S1, and L4-L5  Lumbar facet joints. This would be ablation of the corresponding medial branches and/or dorsal rami.  Patient has had double diagnostic blocks with more than 70% relief.  Subsequent ablation gave them more than 6 months of over 60% relief.  They have had chronic back pain for quite some time, more than 3 months, which has been an ongoing situation with recalcitrant axial back pain.  They have no radicular pain.  Their axial pain is worse with standing and ambulating and on exam today with facet loading.  They have had physical therapy as well as home exercise program.  The imaging noted in the chart below indicated facet pathology. Accordingly they meet all the criteria and qualification for for radiofrequency ablation and we are going to complete this today hopefully for more longer term relief as part of comprehensive management program.   ROS Otherwise per HPI.  Assessment & Plan: Visit Diagnoses:    ICD-10-CM   1. Spondylosis without myelopathy or radiculopathy, lumbar region  M47.816 XR C-ARM NO REPORT    Radiofrequency,Lumbar    methylPREDNISolone acetate (DEPO-MEDROL) injection 80 mg      Plan: No additional findings.   Meds & Orders:  Meds ordered this encounter  Medications   methylPREDNISolone acetate (DEPO-MEDROL) injection 80 mg    Orders Placed This Encounter  Procedures   Radiofrequency,Lumbar   XR C-ARM NO REPORT    Follow-up: Return for visit to requesting provider as needed.   Procedures: No procedures performed  Lumbar Facet Joint Nerve Denervation  Patient: Andrew Pacheco      Date of Birth: Dec 22, 1940 [name]: [id number] PCP: Donnajean Lopes, MD      Visit Date: 12/21/2022   Universal Protocol:    Date/Time: 12/26/2408:41 AM  Consent Given By: the patient  Position: PRONE  Additional Comments: Vital signs were monitored before and after the procedure. Patient was prepped and draped in the usual sterile fashion. The correct patient, procedure, and site was verified.   Injection Procedure Details:   Procedure diagnoses:  1. Spondylosis without myelopathy or radiculopathy, lumbar region      Meds Administered:  Meds ordered this encounter  Medications   methylPREDNISolone acetate (DEPO-MEDROL) injection 80 mg     Laterality: Right  Location/Site:  L4-L5, L3 and L4 medial branches and L5-S1, L4 medial branch and L5 dorsal ramus  Needle: 18 ga.,  15m active tip, 1079mRF Cannula  Needle Placement: Along juncture of superior articular process and transverse pocess  Findings:  -Comments:  Procedure Details: For each desired target nerve, the corresponding transverse process (sacral ala for the L5 dorsal rami) was identified and the fluoroscope was positioned to square off the endplates of the corresponding vertebral body to achieve a true AP midline view.  The beam was then obliqued 15 to 20 degrees and caudally tilted 15 to 20 degrees to line up a trajectory along the target nerves. The skin over the target of the junction of superior articulating process and transverse process (sacral ala for the L5 dorsal rami) was infiltrated with 3m35m  of 1% Lidocaine without Epinephrine.  The 18 gauge 94m active tip outer cannula was advanced in trajectory view to the target.  This procedure was repeated for each target nerve.  Then, for all levels, the outer cannula placement was fine-tuned and the position was then confirmed with bi-planar imaging.    Test stimulation was done both at sensory and motor levels to ensure there was no radicular  stimulation. The target tissues were then infiltrated with 1 ml of 1% Lidocaine without Epinephrine. Subsequently, a percutaneous neurotomy was carried out for 90 seconds at 80 degrees Celsius.  After the completion of the lesion, 1 ml of injectate was delivered. It was then repeated for each facet joint nerve mentioned above. Appropriate radiographs were obtained to verify the probe placement during the neurotomy.   Additional Comments:  No complications occurred Dressing: 2 x 2 sterile gauze and Band-Aid    Post-procedure details: Patient was observed during the procedure. Post-procedure instructions were reviewed.  Patient left the clinic in stable condition.       Clinical History: EXAM: MRI LUMBAR SPINE WITHOUT CONTRAST   TECHNIQUE: Multiplanar, multisequence MR imaging of the lumbar spine was performed. No intravenous contrast was administered.   COMPARISON:  08/27/2008 CT L-spine. 07/15/2018 MRI lumbar spine and prior.   FINDINGS: Segmentation:  Standard.   Alignment: Straightening of lordosis. Minimal grade 1 L5-S1 anterolisthesis.   Vertebrae:  Vertebral body heights are preserved.  L3 hemangioma.   Conus medullaris and cauda equina: Conus extends to the L1 level. Conus and cauda equina appear normal.   Disc levels: Multilevel desiccation and disc space loss.   L1-2: Minimal disc bulge with superimposed superiorly migrated left paracentral extrusion, unchanged. Patent spinal canal and neural foramen.   L2-3: No significant disc bulge. Bilateral facet degenerative spurring. Patent spinal canal and neural foramen.   L3-4: Mild disc bulge, prominent ligamentum flavum and bilateral facet degenerative spurring. Mild spinal canal and bilateral neural foraminal narrowing.   L4-5: Mild disc bulge, ligamentum flavum and bilateral facet hypertrophy. Patent spinal canal. Mild bilateral neural foraminal narrowing.   L5-S1: Disc bulge grazing the exiting L5 nerve  root. Bilateral facet hypertrophy. Patent spinal canal. Mild bilateral neural foraminal narrowing.   Paraspinal and other soft tissues: Negative.   IMPRESSION: Multilevel spondylosis, grossly unchanged.   Superiorly migrated left L1-2 paracentral extrusion is similar to prior exam. Patent spinal canal and neural foramen at this level.   Mild L3-4 spinal canal and mild L3-S1 bilateral neural foraminal narrowing.     Electronically Signed   By: CPrimitivo GauzeM.D.   On: 11/10/2020 13:46     Objective:  VS:  HT:    WT:   BMI:     BP:118/70  HR:69bpm  TEMP: ( )  RESP:  Physical Exam Vitals and nursing note reviewed.  Constitutional:      General: He is not in acute distress.    Appearance: Normal appearance. He is not ill-appearing.  HENT:     Head: Normocephalic and atraumatic.     Right Ear: External ear normal.     Left Ear: External ear normal.     Nose: No congestion.  Eyes:     Extraocular Movements: Extraocular movements intact.  Cardiovascular:     Rate and Rhythm: Normal rate.     Pulses: Normal pulses.  Pulmonary:     Effort: Pulmonary effort is normal. No respiratory distress.  Abdominal:     General: There is no distension.  Palpations: Abdomen is soft.  Musculoskeletal:        General: No tenderness or signs of injury.     Cervical back: Neck supple.     Right lower leg: No edema.     Left lower leg: No edema.     Comments: Patient has good distal strength without clonus.  Skin:    Findings: No erythema or rash.  Neurological:     General: No focal deficit present.     Mental Status: He is alert and oriented to person, place, and time.     Sensory: No sensory deficit.     Motor: No weakness or abnormal muscle tone.     Coordination: Coordination normal.  Psychiatric:        Mood and Affect: Mood normal.        Behavior: Behavior normal.      Imaging: No results found.

## 2023-03-31 ENCOUNTER — Other Ambulatory Visit: Payer: Self-pay | Admitting: Internal Medicine

## 2023-03-31 DIAGNOSIS — K259 Gastric ulcer, unspecified as acute or chronic, without hemorrhage or perforation: Secondary | ICD-10-CM

## 2023-06-27 ENCOUNTER — Other Ambulatory Visit: Payer: Self-pay | Admitting: Internal Medicine

## 2023-06-27 DIAGNOSIS — K259 Gastric ulcer, unspecified as acute or chronic, without hemorrhage or perforation: Secondary | ICD-10-CM

## 2023-07-10 IMAGING — DX DG PORTABLE PELVIS
1 series · 1 of 1 positions shown · non-contrast
Comparison: Preoperative imaging.

CLINICAL DATA: Postop right hip replacement

EXAM:
PORTABLE PELVIS 1-2 VIEWS

[pelvis ap]
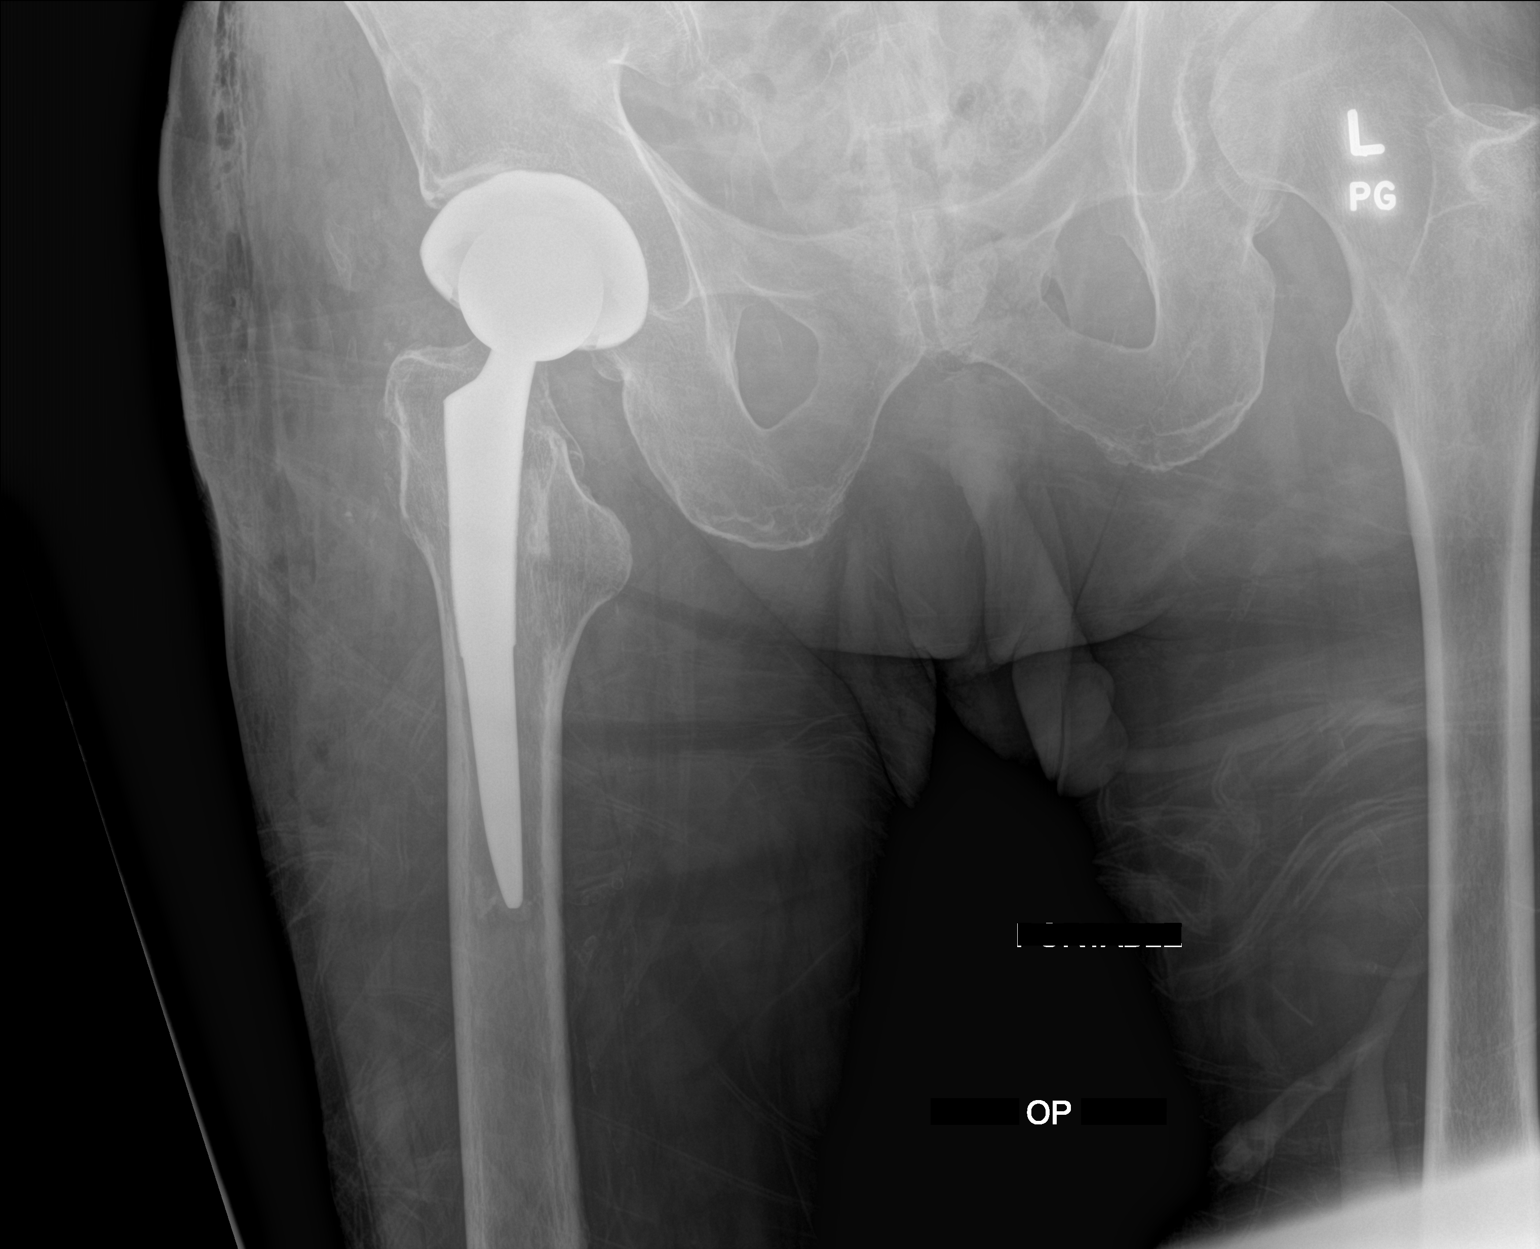

[1 of 1 positions shown; findings below may reference images not displayed]

FINDINGS: Right hip arthroplasty in expected alignment. No periprosthetic
lucency or fracture. Recent postsurgical change includes air and
edema in the soft tissues.
IMPRESSION: Right hip arthroplasty without immediate postoperative complication.

## 2024-08-29 ENCOUNTER — Inpatient Hospital Stay

## 2024-08-29 ENCOUNTER — Inpatient Hospital Stay: Attending: Hematology and Oncology | Admitting: Hematology and Oncology

## 2024-08-29 VITALS — BP 117/63 | HR 67 | Temp 97.9°F | Resp 16 | Wt 131.8 lb

## 2024-08-29 DIAGNOSIS — D472 Monoclonal gammopathy: Secondary | ICD-10-CM | POA: Insufficient documentation

## 2024-08-29 DIAGNOSIS — R634 Abnormal weight loss: Secondary | ICD-10-CM | POA: Insufficient documentation

## 2024-08-29 DIAGNOSIS — R230 Cyanosis: Secondary | ICD-10-CM | POA: Diagnosis not present

## 2024-08-29 DIAGNOSIS — N189 Chronic kidney disease, unspecified: Secondary | ICD-10-CM | POA: Insufficient documentation

## 2024-08-29 DIAGNOSIS — G629 Polyneuropathy, unspecified: Secondary | ICD-10-CM | POA: Diagnosis not present

## 2024-08-29 DIAGNOSIS — D649 Anemia, unspecified: Secondary | ICD-10-CM

## 2024-08-29 DIAGNOSIS — R5383 Other fatigue: Secondary | ICD-10-CM | POA: Insufficient documentation

## 2024-08-29 DIAGNOSIS — D631 Anemia in chronic kidney disease: Secondary | ICD-10-CM | POA: Insufficient documentation

## 2024-08-29 LAB — CBC WITH DIFFERENTIAL/PLATELET
Abs Immature Granulocytes: 0.07 K/uL (ref 0.00–0.07)
Basophils Absolute: 0 K/uL (ref 0.0–0.1)
Basophils Relative: 0 %
Eosinophils Absolute: 0 K/uL (ref 0.0–0.5)
Eosinophils Relative: 0 %
HCT: 28.3 % — ABNORMAL LOW (ref 39.0–52.0)
Hemoglobin: 9.6 g/dL — ABNORMAL LOW (ref 13.0–17.0)
Immature Granulocytes: 1 %
Lymphocytes Relative: 20 %
Lymphs Abs: 1.5 K/uL (ref 0.7–4.0)
MCH: 31.7 pg (ref 26.0–34.0)
MCHC: 33.9 g/dL (ref 30.0–36.0)
MCV: 93.4 fL (ref 80.0–100.0)
Monocytes Absolute: 4.3 K/uL — ABNORMAL HIGH (ref 0.1–1.0)
Monocytes Relative: 58 %
Neutro Abs: 1.6 K/uL — ABNORMAL LOW (ref 1.7–7.7)
Neutrophils Relative %: 21 %
Platelets: 190 K/uL (ref 150–400)
RBC: 3.03 MIL/uL — ABNORMAL LOW (ref 4.22–5.81)
RDW: 13.8 % (ref 11.5–15.5)
WBC: 7.5 K/uL (ref 4.0–10.5)
nRBC: 0 % (ref 0.0–0.2)

## 2024-08-29 LAB — CMP (CANCER CENTER ONLY)
ALT: 8 U/L (ref 0–44)
AST: 14 U/L — ABNORMAL LOW (ref 15–41)
Albumin: 4.8 g/dL (ref 3.5–5.0)
Alkaline Phosphatase: 71 U/L (ref 38–126)
Anion gap: 8 (ref 5–15)
BUN: 37 mg/dL — ABNORMAL HIGH (ref 8–23)
CO2: 26 mmol/L (ref 22–32)
Calcium: 9.5 mg/dL (ref 8.9–10.3)
Chloride: 103 mmol/L (ref 98–111)
Creatinine: 2.04 mg/dL — ABNORMAL HIGH (ref 0.61–1.24)
GFR, Estimated: 31 mL/min — ABNORMAL LOW (ref >60)
Glucose, Bld: 82 mg/dL (ref 70–99)
Potassium: 4.8 mmol/L (ref 3.5–5.1)
Sodium: 137 mmol/L (ref 135–145)
Total Bilirubin: 0.4 mg/dL (ref 0.0–1.2)
Total Protein: 8.2 g/dL — ABNORMAL HIGH (ref 6.5–8.1)

## 2024-08-29 LAB — VITAMIN B12: Vitamin B-12: 489 pg/mL (ref 180–914)

## 2024-08-29 LAB — IRON AND IRON BINDING CAPACITY (CC-WL,HP ONLY)
Iron: 78 ug/dL (ref 45–182)
Saturation Ratios: 32 % (ref 17.9–39.5)
TIBC: 248 ug/dL — ABNORMAL LOW (ref 250–450)
UIBC: 170 ug/dL (ref 117–376)

## 2024-08-29 LAB — RETICULOCYTES
Immature Retic Fract: 13.1 % (ref 2.3–15.9)
RBC.: 3.07 MIL/uL — ABNORMAL LOW (ref 4.22–5.81)
Retic Count, Absolute: 20.3 K/uL (ref 19.0–186.0)
Retic Ct Pct: 0.7 % (ref 0.4–3.1)

## 2024-08-29 LAB — FERRITIN: Ferritin: 340 ng/mL — ABNORMAL HIGH (ref 24–336)

## 2024-08-29 LAB — LACTATE DEHYDROGENASE: LDH: 84 U/L — ABNORMAL LOW (ref 98–192)

## 2024-08-29 NOTE — Progress Notes (Signed)
 [facility] CONSULT NOTE  Patient Care Team: Yolande Toribio MATSU, MD as PCP - General (Internal Medicine)  CHIEF COMPLAINTS/PURPOSE OF CONSULTATION:  Anemia, weight loss  ASSESSMENT & PLAN:   Assessment and Plan Assessment & Plan Anemia of chronic kidney disease Mild anemia with hemoglobin at 10.5, likely due to chronic kidney disease. Elevated creatinine at 1.9. Anemia does not fully explain weight loss and fatigue. - Order further work up to evaluate.   Unintentional weight loss and fatigue Significant weight loss from 185 pounds to 131 pounds over three years, with worsening fatigue. Anemia does not fully explain symptoms. Differential includes malignancy, but no specific cancer symptoms identified. - Order blood tests to investigate potential causes of weight loss and fatigue. - Consider imaging studies if blood tests do not reveal cause. - Evaluate for potential malignancy if indicated by test results.  Peripheral neuropathy Numbness in fingertips and toes with cold sensation. Current B12 levels are normal, but symptoms may be related to weight loss and nutritional deficiencies.  Peripheral cyanosis Blue hue in nail beds suggesting possible oxygenation issues. No history of heart disease   HISTORY OF PRESENTING ILLNESS:  Andrew Pacheco 83 y.o. male is here because of anemia and weight loss  Discussed the use of AI scribe software for clinical note transcription with the patient, who gave verbal consent to proceed.  History of Present Illness Andrew Pacheco is an 83 year old male with anemia who presents with fatigue and weight loss. He was referred by Dr. Jakie for evaluation of anemia and fatigue.  He has been experiencing extreme fatigue since the end of July, with a lack of energy so severe that even minimal exertion, such as walking from a parking lot to a store entrance, leaves him exhausted. This level of fatigue is new and significantly impacts his  daily activities, including his ability to fish, which he previously enjoyed.  He has experienced significant weight loss over the past few years, dropping from 185 pounds three years ago to 122 pounds currently. Despite efforts to eat more and consume protein drinks, he continues to lose weight and has a poor appetite, often feeling full quickly. He describes feeling like a 'skeleton' due to the weight loss.  He reports numbness and coldness in his fingertips and toes, which he attributes to his weight loss. He frequently wears a jacket or sweater due to feeling cold. No chest pain, arm pain, or heart problems, but he notes difficulty breathing and a lack of energy.  His medical history includes bleeding ulcers in 2015, which required blood transfusions and iron supplementation. He was on iron until about a year ago but is not currently taking it. He has a history of kidney issues, with occasional blood in his urine in the past, though not recently.  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 He experiences constipation as a side effect of the medication, which he manages with senna tablets.  No recent blood in his stool or black stools and has not noticed any swollen lymph nodes. He has a history of B12 deficiency following his bleeding ulcers but is not currently on B12 supplementation.  All other systems were reviewed with the patient and are negative.  MEDICAL HISTORY:  Past Medical History:  Diagnosis Date   Anemia    Arthritis    both hips, back (09/09/2017)   Chronic back pain  middle and lower back (09/09/2017)   Colon polyps    Daily headache    Diverticulosis    GERD (gastroesophageal reflux disease)    Glaucoma, both eyes    H pylori ulcer    Hard of hearing    HTN (hypertension)    Insomnia    due to disc   Lumbar disc disease    Migraine    q 3-4 months (09/09/2017)   Seasonal  allergies     SURGICAL HISTORY: Past Surgical History:  Procedure Laterality Date   APPENDECTOMY     CYST EXCISION Right ~ 1971   knee   ESOPHAGOGASTRODUODENOSCOPY N/A 01/15/2018   Procedure: ESOPHAGOGASTRODUODENOSCOPY (EGD);  Surgeon: Aneita Gwendlyn DASEN, MD;  Location: [facility] ENDOSCOPY;  Service: Endoscopy;  Laterality: N/A;   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   Procedure: TOTAL HIP ARTHROPLASTY ANTERIOR APPROACH;  Surgeon: Fidel Rogue, MD;  Location: [facility] ORS;  Service: Orthopedics;  Laterality: Right;    SOCIAL HISTORY: Social History   Socioeconomic History   Marital status: Married    Spouse name: Not on file   Number of children: 2   Years of education: Not on file   Highest education level: Not on file  Occupational History   Occupation: security guard - [employer]  Tobacco Use   Smoking status: Never   Smokeless tobacco: Never  Vaping Use   Vaping status: Never Used  Substance and Sexual Activity   Alcohol  use: Yes    Comment: 09/09/2017 couple glasses of wine q 6 months   Drug use: No   Sexual activity: Not on file  Other Topics Concern   Not on file  Social History Narrative   Not on file   Social Drivers of Health   Financial Resource Strain: Not on file  Food Insecurity: Not on file  Transportation Needs: Not on file  Physical Activity: Not on file  Stress: Not on file  Social Connections: Not on file  Intimate Partner Violence: Not on file    FAMILY HISTORY: Family History  Problem Relation Age of Onset   Heart disease Father    Emphysema Father    Diverticulosis Father    Cancer Mother        gallbladder-mets   Arthritis Mother    Liver cancer Mother     ALLERGIES:  has no known allergies.  MEDICATIONS:  Current Outpatient Medications  Medication Sig Dispense Refill   COMBIGAN  0.2-0.5 % ophthalmic solution Place 1 drop into both eyes 2 (two) times daily.  2   cyclobenzaprine (FLEXERIL) 10 MG tablet Take 10 mg by mouth as  needed for muscle spasms.     diazepam  (VALIUM ) 5 MG tablet Take one tablet by mouth with food one hour prior to procedure. May repeat 30 minutes prior if needed. 2 tablet 0   feeding supplement, ENSURE ENLIVE, (ENSURE ENLIVE) LIQD Take 237 mLs by mouth 2 (two) times daily between meals. 237 mL 0   HYDROcodone -acetaminophen  (NORCO) 7.5-325 MG tablet Take 1-2 tablets by mouth every 4 (four) hours as needed for severe pain (pain score 7-10). 30 tablet 0   ipratropium (ATROVENT ) 0.06 % nasal spray Place 2 sprays into both nostrils as needed for rhinitis.     metoprolol  succinate (TOPROL -XL) 25 MG 24 hr tablet Take 12.5 mg by mouth daily.     pantoprazole  (PROTONIX ) 40 MG tablet Take 1 tablet (40 mg total) by mouth daily. Office visit for further refills 90 tablet 0   polyethylene  glycol (MIRALAX  / GLYCOLAX ) 17 g packet Take 17 g by mouth daily. 14 each 0   Prenatal Vit-Fe Fumarate-FA (PRENATAL VITAMINS) 28-0.8 MG TABS Take 1 tablet by mouth daily.  12   senna-docusate (SENOKOT-S) 8.6-50 MG tablet Take 2 tablets by mouth 2 (two) times daily.     travoprost , benzalkonium, (TRAVATAN ) 0.004 % ophthalmic solution Place 1 drop into both eyes at bedtime.     zolpidem  (AMBIEN ) 10 MG tablet Take 1 tablet (10 mg total) by mouth at bedtime as needed for sleep. 15 tablet 0   No current facility-administered medications for this visit.     PHYSICAL EXAMINATION: ECOG PERFORMANCE STATUS: 2 - Symptomatic, <50% confined to bed  Vitals:   08/29/24 1128  BP: 117/63  Pulse: 67  Resp: 16  Temp: 97.9 F (36.6 C)  SpO2: 98%   Filed Weights   08/29/24 1128  Weight: 131 lb 12.8 oz (59.8 kg)    GENERAL:alert, no distress and comfortable SKIN: skin color, texture, turgor are normal, no rashes or significant lesions EYES: normal, conjunctiva are pink and non-injected, sclera clear OROPHARYNX:no exudate, no erythema and lips, buccal mucosa, and tongue normal  NECK: supple, thyroid  normal size, non-tender,  without nodularity LYMPH:  no palpable lymphadenopathy in the cervical, axillary  LUNGS: clear to auscultation and percussion with normal breathing effort HEART: regular rate & rhythm and no murmurs and no lower extremity edema ABDOMEN:abdomen soft, non-tender and normal bowel sounds Musculoskeletal:no cyanosis of digits and no clubbing  PSYCH: alert & oriented x 3 with fluent speech NEURO: no focal motor/sensory deficits  LABORATORY DATA:  I have reviewed the data as listed Lab Results  Component Value Date   WBC 8.8 07/13/2021   HGB 8.2 (L) 07/13/2021   HCT 25.3 (L) 07/13/2021   MCV 93.0 07/13/2021   PLT 141 (L) 07/13/2021     Chemistry      Component Value Date/Time   NA 137 07/13/2021 0257   K 4.8 07/13/2021 0257   CL 105 07/13/2021 0257   CO2 25 07/13/2021 0257   BUN 24 (H) 07/13/2021 0257   CREATININE 1.13 07/13/2021 0257      Component Value Date/Time   CALCIUM  8.7 (L) 07/13/2021 0257   ALKPHOS 49 01/14/2018 0823   AST 13 (L) 01/14/2018 0823   ALT 9 (L) 01/14/2018 0823   BILITOT 0.5 01/14/2018 0823       RADIOGRAPHIC STUDIES: I have personally reviewed the radiological images as listed and agreed with the findings in the report. No results found.  All questions were answered. The patient knows to call the clinic with any problems, questions or concerns. I spent 45 minutes in the care of this patient including H and P, review of records, counseling and coordination of care.     Amber Stalls, MD 08/29/2024 11:35 AM

## 2024-08-30 LAB — FOLATE RBC
Folate, Hemolysate: >620 ng/mL
Folate, RBC: >2102 ng/mL (ref >498)
Hematocrit: 29.5 % — ABNORMAL LOW (ref 37.5–51.0)

## 2024-08-30 LAB — ANTINUCLEAR ANTIBODIES, IFA: ANA Ab, IFA: NEGATIVE

## 2024-09-02 NOTE — Progress Notes (Signed)
 Cardiology Office Note   Date:  09/05/2024  ID:  Deane, Wattenbarger 09/21/1941, [name] [id number] PCP: Yolande Toribio MATSU, MD  [facility] Providers Cardiologist:  Gordy Bergamo, MD   History of Present Illness ANTWAIN CALIENDO is a 83 y.o. male with a past medical history of CKD stage IIIb, protein calorie malnutrition, hypertension, anemia of chronic disease,  chronic back pain, hyperlipidemia, depression.  Presents today for evaluation of dyspnea on exertion and chest pain.  Note was previously seen by Dr. Bergamo and 08/2017 during an admission for dizziness, near syncope.  Underwent echocardiogram 08/2017 that showed EF 60 to 65%, no regional wall motion abnormalities, mild aortic regurgitation.  Carotid ultrasounds at that time showed 40-59% right ICA stenosis and 1-39% left ICA stenosis.  Today, patient presents for an office appointment accompanied by his wife.  Patient reports that recently he has been having episodes of weakness.  Notes that on Saturday, he had 1 of these episodes of weakness.  When he checked his blood pressure it was as low as the 80s systolic.  Heart rate was 38 bpm.  He continued to check his heart rate and blood pressure and eventually they improved.  On Sunday, Monday, Tuesday, his blood pressure was predominantly in the 110s-120s systolic and his heart rate was in the 60s.  He notes that when he has these episodes of weakness he often feels like he might pass out.  Denies true syncope.  These episodes seem to be unprovoked.  He admits to having poor oral intake and quite a bit of weight loss recently.  He has also been followed by the cancer center for iron deficiency anemia.  He has been having shortness of breath on exertion since July of this year.  Notes that he gets very short of breath when walking into the grocery store and has to rest several times to catch his breath while shopping.  This is new for him.  He denies orthopnea.  Denies chest pain.  Has mild leg  swelling at times but nothing significant.   Studies Reviewed Cardiac Studies & Procedures   ______________________________________________________________________________________________     ECHOCARDIOGRAM  ECHOCARDIOGRAM COMPLETE 09/11/2017  Narrative *[place]* *[facility]* [address] [place], [place] [address] [phone]  ------------------------------------------------------------------- Transthoracic Echocardiography  Patient:    Schrader, Wacey W MR #:       [id number] Study Date: 09/11/2017 Gender:     M Age:        76 Height:     188 cm Weight:     70 .6 kg BSA:        1.91 m^2 Pt. Status: Room:       5W25C  SONOGRAPHER  Alberta Lis, RVS ADMITTING    Barbarann Delon TISA Sebastian Toribio LULLA BARTON Sebastian Toribio LULLA PERFORMING   [facility], Inpatient ATTENDING    Morgan Vernell Search SONOGRAPHER  Dance, Tiffany  cc:  ------------------------------------------------------------------- LV EF: 60% -   65%  ------------------------------------------------------------------- History:   PMH:  Altered Mental Status.  Risk factors: Hypertension.  ------------------------------------------------------------------- Study Conclusions  - Left ventricle: The cavity size was normal. Wall thickness was increased in a pattern of mild LVH. Systolic function was normal. The estimated ejection fraction was in the range of 60% to 65%. Wall motion was normal; there were no regional wall motion abnormalities. - Aortic valve: Mildly calcified annulus. Trileaflet; mildly calcified leaflets. There was mild regurgitation. - Mitral valve: Mildly  calcified annulus. - Right atrium: Central venous pressure (est): 3 mm Hg. - Atrial septum: No defect or patent foramen ovale was identified. - Tricuspid valve: There was mild regurgitation. - Pulmonary arteries: PA peak pressure: 26 mm Hg (S). - Pericardium, extracardiac: There was no pericardial  effusion.  Impressions:  - Mild LVH with LVEF 60-65% and grade 1 diastolic dysfunction. No obvious wall motion abnormalities with Definity  contrast. Mildly calcified mitral and aortic annulus. Mild aortic regurgitation. Mild tricuspid regurgitation with estimated PASP 26 mmHg.  ------------------------------------------------------------------- Study data:  No prior study was available for comparison.  Study status:  Routine.  Procedure:  The patient reported no pain pre or post test. Transthoracic echocardiography. Image quality was adequate.  Study completion:  There were no complications. Transthoracic echocardiography.  M-mode, complete 2D, spectral Doppler, and color Doppler.  Birthdate:  Patient birthdate: 03/20/1941.  Age:  Patient is 83 yr old.  Sex:  Gender: male. BMI: 20 kg/m^2.  Blood pressure:     133/61  Patient status: Inpatient.  Study date:  Study date: 09/11/2017. Study time: 01:58 PM.  Location:  Bedside.  -------------------------------------------------------------------  ------------------------------------------------------------------- Left ventricle:  The cavity size was normal. Wall thickness was increased in a pattern of mild LVH. Systolic function was normal. The estimated ejection fraction was in the range of 60% to 65%. Wall motion was normal; there were no regional wall motion abnormalities.  ------------------------------------------------------------------- Aortic valve:   Mildly calcified annulus. Trileaflet; mildly calcified leaflets.  Doppler:  There was mild regurgitation.  ------------------------------------------------------------------- Aorta:  Aortic root: The aortic root was normal in size.  ------------------------------------------------------------------- Mitral valve:   Mildly calcified annulus.  Doppler:  There was no significant regurgitation.  ------------------------------------------------------------------- Left atrium:   The atrium was normal in size.  ------------------------------------------------------------------- Atrial septum:  No defect or patent foramen ovale was identified.  ------------------------------------------------------------------- Right ventricle:  The cavity size was normal. Systolic function was normal.  ------------------------------------------------------------------- Pulmonic valve:   Poorly visualized.  ------------------------------------------------------------------- Tricuspid valve:   The valve appears to be grossly normal. Doppler:  There was mild regurgitation.  ------------------------------------------------------------------- Right atrium:  The atrium was normal in size.  ------------------------------------------------------------------- Pericardium:  There was no pericardial effusion.  ------------------------------------------------------------------- Systemic veins: Inferior vena cava: The vessel was normal in size. The respirophasic diameter changes were in the normal range (>= 50%), consistent with normal central venous pressure.  ------------------------------------------------------------------- Measurements  Left ventricle                         Value        Reference LV ID, ED, PLAX chordal        (L)     41.1  mm     43 - 52 LV ID, ES, PLAX chordal                29.3  mm     23 - 38 LV fx shortening, PLAX chordal         29    %      >=29 LV PW thickness, ED                    12.2  mm     --------- IVS/LV PW ratio, ED                    1.09         <=1.3 LV e&', lateral  8.38  cm/s   --------- LV E/e&', lateral                       6.31         --------- LV e&', medial                          7.94  cm/s   --------- LV E/e&', medial                        6.66         --------- LV e&', average                         8.16  cm/s   --------- LV E/e&', average                       6.48         ---------  Ventricular  septum                     Value        Reference IVS thickness, ED                      13.3  mm     ---------  LVOT                                   Value        Reference LVOT ID, S                             22    mm     --------- LVOT area                              3.8   cm^2   ---------  Aorta                                  Value        Reference Aortic root ID, ED                     37    mm     --------- Ascending aorta ID, A-P, S             35    mm     ---------  Left atrium                            Value        Reference LA ID, A-P, ES                         36    mm     --------- LA ID/bsa, A-P                         1.89  cm/m^2 <=2.2 LA volume, S  32.5  ml     --------- LA volume/bsa, S                       17    ml/m^2 --------- LA volume, ES, 1-p A4C                 36.2  ml     --------- LA volume/bsa, ES, 1-p A4C             19    ml/m^2 --------- LA volume, ES, 1-p A2C                 28.2  ml     --------- LA volume/bsa, ES, 1-p A2C             14.8  ml/m^2 ---------  Mitral valve                           Value        Reference Mitral E-wave peak velocity            52.9  cm/s   --------- Mitral A-wave peak velocity            90.2  cm/s   --------- Mitral deceleration time       (H)     394   ms     150 - 230 Mitral E/A ratio, peak                 0.6          ---------  Pulmonary arteries                     Value        Reference PA pressure, S, DP                     26    mm Hg  <=30  Tricuspid valve                        Value        Reference Tricuspid regurg peak velocity         242   cm/s   --------- Tricuspid peak RV-RA gradient          23    mm Hg  ---------  Systemic veins                         Value        Reference Estimated CVP                          3     mm Hg  ---------  Right ventricle                        Value        Reference TAPSE                                  17.1  mm      --------- RV pressure, S, DP                     26    mm Hg  <=30 RV s&',  lateral, S                      9.36  cm/s   ---------  Legend: (L)  and  (H)  mark values outside specified reference range.  ------------------------------------------------------------------- Prepared and Electronically Authenticated by  Jayson Sierras, M.D. 2018-09-23T15:31:03          ______________________________________________________________________________________________       Risk Assessment/Calculations      Physical Exam VS:  BP (!) 160/76   Pulse 69   Ht 5' 9 (1.753 m)   Wt 135 lb (61.2 kg)   SpO2 94%   BMI 19.94 kg/m        Wt Readings from Last 3 Encounters:  09/05/24 135 lb (61.2 kg)  08/29/24 131 lb 12.8 oz (59.8 kg)  07/09/21 146 lb 13.2 oz (66.6 kg)    GEN: Thin, elderly male.  Sitting comfortably in the chair no acute distress NECK: No JVD; No carotid bruits CARDIAC: RRR, no murmurs, rubs, gallops.  Radial pulses 2+ bilaterally RESPIRATORY:  Clear to auscultation without rales, wheezing or rhonchi.  Normal work of breathing on room air ABDOMEN: Soft, non-tender, non-distended EXTREMITIES:  No edema in bilateral lower extremities; No deformity   ASSESSMENT AND PLAN   Weakness near syncope Labile BP Bradycardia - Patient reports having episodes of weakness.  Notes a particularly bad episode of weakness this past Saturday 9/13.  During that episode he checked his blood pressure and was found to be hypotensive with systolic blood pressures down to the 80s.  He was also bradycardic with heart rate as low as 38 bpm.  Felt very close to passing out but denies syncope -Patient has multiple reasons why he could be having weakness.  He has iron deficiency anemia with hemoglobin recently 9.6 on 9/10.  He has also had a poor appetite and weight loss. - Patient reports that when he is not having an episode of weakness, he feels very well and is able to work on his truck, walk  around, go from sitting to standing without dizziness, syncope, near syncope.  - He continues to check his heart rate and blood pressure for the past 4 days.  BP has predominantly been in the 110s-120s systolic.  Heart rate predominantly in the 50s-60s - BP in clinic elevated to 160/76.  With his recent episodes of weakness, dizziness, bradycardia/hypotension and near syncope, I am very hesitant to add blood pressure medications based on this 1 elevated reading - Instructed patient to keep BP log and arranged close follow-up - Instructed patient to increase oral hydration. - Ordered 7-day live monitor.  Patient has right bundle branch block on EKG today.  In the past he has had bifascicular block with RBBB and LPFB. With his episodes of bradycardia, possible he has had progression of conduction disease  - Ordered echocardiogram - Instructed patient not to drive until workup complete  DOE  - Patient adamantly denies chest pain but reports that he has had some shortness of breath on exertion.  Notes walking into the grocery store he has to take a few breaks to catch his breath. - Denies orthopnea or significant lower extremity swelling. he is euvolemic on exam today - Ordered echocardiogram   Carotid Artery Stenosis  - Carotid dopplers in 2018 showed 1-39% stenosis in left ICA, 40-59% stenosis in right ICA  - Ordered repeat studies for monitoring   Iron Deficiency Anemia  - Followed by heme-onc.  Hemoglobin recently 9.6 on  9/10 - On iron supplementation  CKD stage IIIb - Creatinine 2.04 on 9/10 - Encouraged patient to increase oral hydration given his recent near syncopal episodes  Dispo: Follow up with APP in 3 weeks   Signed, Rollo FABIENE Louder, PA-C

## 2024-09-05 ENCOUNTER — Other Ambulatory Visit

## 2024-09-05 ENCOUNTER — Ambulatory Visit: Attending: Cardiology | Admitting: Cardiology

## 2024-09-05 ENCOUNTER — Encounter: Payer: Self-pay | Admitting: Cardiology

## 2024-09-05 VITALS — BP 160/76 | HR 69 | Ht 69.0 in | Wt 135.0 lb

## 2024-09-05 DIAGNOSIS — R55 Syncope and collapse: Secondary | ICD-10-CM | POA: Diagnosis not present

## 2024-09-05 DIAGNOSIS — I10 Essential (primary) hypertension: Secondary | ICD-10-CM

## 2024-09-05 DIAGNOSIS — I6523 Occlusion and stenosis of bilateral carotid arteries: Secondary | ICD-10-CM

## 2024-09-05 DIAGNOSIS — R531 Weakness: Secondary | ICD-10-CM | POA: Diagnosis not present

## 2024-09-05 DIAGNOSIS — R0989 Other specified symptoms and signs involving the circulatory and respiratory systems: Secondary | ICD-10-CM

## 2024-09-05 DIAGNOSIS — R001 Bradycardia, unspecified: Secondary | ICD-10-CM

## 2024-09-05 NOTE — Patient Instructions (Signed)
 Medication Instructions:  Your physician recommends that you continue on your current medications as directed. Please refer to the Current Medication list given to you today.  *If you need a refill on your cardiac medications before your next appointment, please call your pharmacy*   Testing/Procedures: Your physician has requested that you have a carotid duplex. This test is an ultrasound of the carotid arteries in your neck. It looks at blood flow through these arteries that supply the brain with blood. Allow one hour for this exam. There are no restrictions or special instructions.    Your physician has requested that you have an echocardiogram. Echocardiography is a painless test that uses sound waves to create images of your heart. It provides your doctor with information about the size and shape of your heart and how well your heart's chambers and valves are working. This procedure takes approximately one hour. There are no restrictions for this procedure. Please do NOT wear cologne, perfume, aftershave, or lotions (deodorant is allowed). Please arrive 15 minutes prior to your appointment time.  Please note: We ask at that you not bring children with you during ultrasound (echo/ vascular) testing. Due to room size and safety concerns, children are not allowed in the ultrasound rooms during exams. Our front office staff cannot provide observation of children in our lobby area while testing is being conducted. An adult accompanying a patient to their appointment will only be allowed in the ultrasound room at the discretion of the ultrasound technician under special circumstances. We apologize for any inconvenience.    ZIO XT- Long Term Monitor Instructions  Your physician has requested you wear a ZIO patch monitor for 7 days.   Billing and Patient Assistance Program Information  We have supplied [employer] with any of your insurance information on file for billing purposes. [employer] offers a  sliding scale Patient Assistance Program for patients that do not have  insurance, or whose insurance does not completely cover the cost of the ZIO monitor.  You must apply for the Patient Assistance Program to qualify for this discounted rate.  To apply, please call [employer] at [phone], select option 4, select option 2, ask to apply for  Patient Assistance Program. [facility] will ask your household income, and how many people  are in your household. They will quote your out-of-pocket cost based on that information.  [employer] will also be able to set up a 39-month, interest-free payment plan if needed.  When you are ready to remove the patch, follow instructions on the last 2 pages of Patient  Logbook. Stick patch monitor onto the last page of Patient Logbook.  Place Patient Logbook in the blue and white box. Use locking tab on box and tape box closed  securely. The blue and white box has prepaid postage on it. Please place it in the mailbox as  soon as possible. Your physician should have your test results approximately 7 days after the  monitor has been mailed back to [facility].  Call [facility] Customer Care at [phone] if you have questions regarding  your ZIO XT patch monitor. Call them immediately if you see an orange light blinking on your  monitor.  If your monitor falls off in less than 4 days, contact our Monitor department at [phone].  If your monitor becomes loose or falls off after 4 days call [employer] at [phone] for  suggestions on securing your monitor   Follow-Up: At [facility], you and your health needs are our priority.  As part of our continuing mission to provide you with exceptional heart care, our providers are all part of one team.  This team includes your primary Cardiologist (physician) and Advanced Practice Providers or APPs (Physician Assistants and Nurse Practitioners) who all work together to provide you with the care you need,  when you need it.  Your next appointment:   3 week(s)  Provider:   Gordy Bergamo, MD or Rollo Louder, PA-C          We recommend signing up for the patient portal called MyChart.  Sign up information is provided on this After Visit Summary.  MyChart is used to connect with patients for Virtual Visits (Telemedicine).  Patients are able to view lab/test results, encounter notes, upcoming appointments, etc.  Non-urgent messages can be sent to your provider as well.   To learn more about what you can do with MyChart, go to [email or web address].   Other Instructions Monitor your blood pressure. Check your blood pressure three times daily for the next two weeks.

## 2024-09-05 NOTE — Progress Notes (Unsigned)
 Applied a 7 day Zio AT monitor to patient in the office  Ganji to read

## 2024-09-06 LAB — IMMUNOFIXATION REFLEX, SERUM
IgA: 948 mg/dL — ABNORMAL HIGH (ref 61–437)
IgG (Immunoglobin G), Serum: 927 mg/dL (ref 603–1613)
IgM (Immunoglobulin M), Srm: 297 mg/dL — ABNORMAL HIGH (ref 15–143)

## 2024-09-06 LAB — PROTEIN ELECTROPHORESIS, SERUM, WITH REFLEX
A/G Ratio: 1.1 (ref 0.7–1.7)
Albumin ELP: 4.1 g/dL (ref 2.9–4.4)
Alpha-1-Globulin: 0.3 g/dL (ref 0.0–0.4)
Alpha-2-Globulin: 0.9 g/dL (ref 0.4–1.0)
Beta Globulin: 0.9 g/dL (ref 0.7–1.3)
Gamma Globulin: 1.4 g/dL (ref 0.4–1.8)
Globulin, Total: 3.6 g/dL (ref 2.2–3.9)
M-Spike, %: 0.7 g/dL — ABNORMAL HIGH
SPEP Interpretation: 0
Total Protein ELP: 7.7 g/dL (ref 6.0–8.5)

## 2024-09-08 LAB — HEAVY METALS, BLOOD
Arsenic: 2 ug/L (ref 0–9)
Lead: 1.5 ug/dL (ref 0.0–3.4)
Mercury: <1 ug/L (ref 0.0–14.9)

## 2024-09-12 ENCOUNTER — Ambulatory Visit (HOSPITAL_COMMUNITY)
Admission: RE | Admit: 2024-09-12 | Discharge: 2024-09-12 | Disposition: A | Discharge status: Home / Self Care | Source: Ambulatory Visit | Attending: Cardiology | Admitting: Cardiology

## 2024-09-12 ENCOUNTER — Ambulatory Visit (HOSPITAL_BASED_OUTPATIENT_CLINIC_OR_DEPARTMENT_OTHER)
Admission: RE | Admit: 2024-09-12 | Discharge: 2024-09-12 | Disposition: A | Discharge status: Home / Self Care | Source: Ambulatory Visit | Attending: Cardiology | Admitting: Cardiology

## 2024-09-12 DIAGNOSIS — R55 Syncope and collapse: Secondary | ICD-10-CM | POA: Insufficient documentation

## 2024-09-12 DIAGNOSIS — I6523 Occlusion and stenosis of bilateral carotid arteries: Secondary | ICD-10-CM | POA: Diagnosis present

## 2024-09-13 ENCOUNTER — Ambulatory Visit: Payer: Self-pay | Admitting: Cardiology

## 2024-09-13 ENCOUNTER — Inpatient Hospital Stay

## 2024-09-13 ENCOUNTER — Inpatient Hospital Stay: Admitting: Hematology and Oncology

## 2024-09-13 VITALS — BP 130/65 | HR 83 | Temp 97.6°F | Resp 17 | Ht 69.0 in | Wt 130.3 lb

## 2024-09-13 DIAGNOSIS — D472 Monoclonal gammopathy: Secondary | ICD-10-CM

## 2024-09-13 DIAGNOSIS — D649 Anemia, unspecified: Secondary | ICD-10-CM

## 2024-09-13 DIAGNOSIS — N189 Chronic kidney disease, unspecified: Secondary | ICD-10-CM | POA: Diagnosis not present

## 2024-09-13 LAB — CBC WITH DIFFERENTIAL/PLATELET
Abs Immature Granulocytes: 0.08 K/uL — ABNORMAL HIGH (ref 0.00–0.07)
Basophils Absolute: 0 K/uL (ref 0.0–0.1)
Basophils Relative: 0 %
Eosinophils Absolute: 0 K/uL (ref 0.0–0.5)
Eosinophils Relative: 0 %
HCT: 29.5 % — ABNORMAL LOW (ref 39.0–52.0)
Hemoglobin: 9.8 g/dL — ABNORMAL LOW (ref 13.0–17.0)
Immature Granulocytes: 1 %
Lymphocytes Relative: 22 %
Lymphs Abs: 1.4 K/uL (ref 0.7–4.0)
MCH: 31.1 pg (ref 26.0–34.0)
MCHC: 33.2 g/dL (ref 30.0–36.0)
MCV: 93.7 fL (ref 80.0–100.0)
Monocytes Absolute: 3.5 K/uL — ABNORMAL HIGH (ref 0.1–1.0)
Monocytes Relative: 57 %
Neutro Abs: 1.3 K/uL — ABNORMAL LOW (ref 1.7–7.7)
Neutrophils Relative %: 20 %
Platelets: 180 K/uL (ref 150–400)
RBC: 3.15 MIL/uL — ABNORMAL LOW (ref 4.22–5.81)
RDW: 14 % (ref 11.5–15.5)
Smear Review: NORMAL
WBC: 6.2 K/uL (ref 4.0–10.5)
nRBC: 0 % (ref 0.0–0.2)

## 2024-09-13 NOTE — Progress Notes (Signed)
 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 - Order skeletal survey to identify osteolytic lesions. - Collect 24-hour urine sample to evaluate M protein impact on kidneys. - Schedule follow-up in three weeks to review results and determine further management.  Anemia not due to iron deficiency Anemia with normal iron levels, possibly related to M protein and potential progression to multiple myeloma.    HISTORY OF PRESENTING ILLNESS:  Andrew Pacheco 83 y.o. male is here because of anemia and weight loss  Discussed the use of AI scribe software for clinical note transcription with the patient, who gave verbal consent to proceed.  History of Present Illness  Andrew Pacheco is an 83 year old male who presents with the detection of an abnormal M protein in his blood.  An abnormal M protein was detected in his blood during recent blood work.  He recently completed a week-long heart monitor test and had a carotid artery ultrasound, which showed a 37% blockage. He has not yet received the full results from these tests.  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 He has undergone procedures to sever nerves in the lumbar region, specifically at L5, with the last procedure performed in December of the previous year. He reports excellent results from these procedures and manages his back pain primarily with hydrocodone .  All other systems were reviewed with the patient and are  negative.  MEDICAL HISTORY:  Past Medical History:  Diagnosis Date   Anemia    Arthritis    both hips, back (09/09/2017)   Chronic back pain    middle and lower back (09/09/2017)   Colon polyps    Daily headache    Diverticulosis    GERD (gastroesophageal reflux disease)    Glaucoma, both eyes    H pylori ulcer    Hard of hearing    HTN (hypertension)    Insomnia    due to disc   Lumbar disc disease    Migraine    q 3-4 months (09/09/2017)   Seasonal allergies     SURGICAL HISTORY: Past Surgical History:  Procedure Laterality Date   APPENDECTOMY     CYST EXCISION Right ~ 1971   knee   ESOPHAGOGASTRODUODENOSCOPY N/A 01/15/2018   Procedure: ESOPHAGOGASTRODUODENOSCOPY (EGD);  Surgeon: Aneita Gwendlyn DASEN, MD;  Location[facility] END[facility]COPY;  Service: Endoscopy;  Laterality: N/A;   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   Procedure: TOTAL HIP ARTHROPLASTY ANTERIOR APPROACH;  Surgeon: Fidel Rogue, MD;  Location: [facility] ORS;  Service: Orthopedics;  Laterality: Right;    SOCIAL HISTORY: Social History   Socioeconomic History   Marital status: Married    Spouse name: Not on file   Number of children: 2   Years of education: Not on file   Highest education level: Not on file  Occupational History   Occupation: security guard - [employer]  Tobacco Use   Smoking status: Never   Smokeless tobacco: Never  Vaping Use   Vaping status: Never Used  Substance  and Sexual Activity   Alcohol  use: Yes    Comment: 09/09/2017 couple glasses of wine q 6 months   Drug use: No   Sexual activity: Not on file  Other Topics Concern   Not on file  Social History Narrative   Not on file   Social Drivers of Health   Financial Resource Strain: Not on file  Food Insecurity: Not on file  Transportation Needs: Not on file  Physical Activity: Not on file  Stress: Not on file  Social Connections: Not on file  Intimate Partner Violence: Not on file    FAMILY HISTORY: Family  History  Problem Relation Age of Onset   Heart disease Father    Emphysema Father    Diverticulosis Father    Cancer Mother        gallbladder-mets   Arthritis Mother    Liver cancer Mother     ALLERGIES:  has no known allergies.  MEDICATIONS:  Current Outpatient Medications  Medication Sig Dispense Refill   COMBIGAN  0.2-0.5 % ophthalmic solution Place 1 drop into both eyes 2 (two) times daily.  2   cyclobenzaprine (FLEXERIL) 10 MG tablet Take 10 mg by mouth as needed for muscle spasms.     diazepam  (VALIUM ) 5 MG tablet Take one tablet by mouth with food one hour prior to procedure. May repeat 30 minutes prior if needed. 2 tablet 0   feeding supplement, ENSURE ENLIVE, (ENSURE ENLIVE) LIQD Take 237 mLs by mouth 2 (two) times daily between meals. 237 mL 0   HYDROcodone -acetaminophen  (NORCO) 7.5-325 MG tablet Take 1-2 tablets by mouth every 4 (four) hours as needed for severe pain (pain score 7-10). 30 tablet 0   ipratropium (ATROVENT ) 0.06 % nasal spray Place 2 sprays into both nostrils as needed for rhinitis.     pantoprazole  (PROTONIX ) 40 MG tablet Take 1 tablet (40 mg total) by mouth daily. Office visit for further refills 90 tablet 0   polyethylene glycol (MIRALAX  / GLYCOLAX ) 17 g packet Take 17 g by mouth daily. 14 each 0   Prenatal Vit-Fe Fumarate-FA (PRENATAL VITAMINS) 28-0.8 MG TABS Take 1 tablet by mouth daily.  12   senna-docusate (SENOKOT-S) 8.6-50 MG tablet Take 2 tablets by mouth 2 (two) times daily.     travoprost , benzalkonium, (TRAVATAN ) 0.004 % ophthalmic solution Place 1 drop into both eyes at bedtime.     zolpidem  (AMBIEN ) 10 MG tablet Take 1 tablet (10 mg total) by mouth at bedtime as needed for sleep. 15 tablet 0   No current facility-administered medications for this visit.     PHYSICAL EXAMINATION: ECOG PERFORMANCE STATUS: 2 - Symptomatic, <50% confined to bed  There were no vitals filed for this visit.  There were no vitals filed for this  visit.   GENERAL:alert, no distress and comfortable  LABORATORY DATA:  I have reviewed the data as listed Lab Results  Component Value Date   WBC 7.5 08/29/2024   HGB 9.6 (L) 08/29/2024   HCT 29.5 (L) 08/29/2024   MCV 93.4 08/29/2024   PLT 190 08/29/2024     Chemistry      Component Value Date/Time   NA 137 08/29/2024 1217   K 4.8 08/29/2024 1217   CL 103 08/29/2024 1217   CO2 26 08/29/2024 1217   BUN 37 (H) 08/29/2024 1217   CREATININE 2.04 (H) 08/29/2024 1217      Component Value Date/Time   CALCIUM  9.5 08/29/2024 1217   ALKPHOS 71 08/29/2024 1217  AST 14 (L) 08/29/2024 1217   ALT 8 08/29/2024 1217   BILITOT 0.4 08/29/2024 1217       RADIOGRAPHIC STUDIES: I have personally reviewed the radiological images as listed and agreed with the findings in the report. VAS US  CAROTID Result Date: 09/12/2024 Carotid Arterial Duplex Study Patient Name:  Andrew Pacheco  Date of Exam:   09/12/2024 Medical Rec #: [id number]      Accession #:    [id number] Date of Birth: December 14, 1941       Patient Gender: M Patient Age:   83 years Exam Location:  [address] Procedure:      VAS US  CAROTID Referring Phys: ROLLO LOUDER --------------------------------------------------------------------------------  Indications:  Carotid artery disease and patient presents with complaints of               overall weakness, dizziness and light headiness since the end of               July 2025. He denies any other cerebrovascular symptoms. Risk Factors: Hypertension, no history of smoking. Performing Technologist: Nanetta Shad RVT  Examination Guidelines: A complete evaluation includes B-mode imaging, spectral Doppler, color Doppler, and power Doppler as needed of all accessible portions of each vessel. Bilateral testing is considered an integral part of a complete examination. Limited examinations for reoccurring indications may be performed as noted.  Right Carotid Findings:  +----------+--------+--------+--------+--------------------------+----------+           PSV cm/sEDV cm/sStenosisPlaque Description        Comments   +----------+--------+--------+--------+--------------------------+----------+ CCA Prox  51      7                                                    +----------+--------+--------+--------+--------------------------+----------+ CCA Distal68      17      <50%    heterogenous                         +----------+--------+--------+--------+--------------------------+----------+ ICA Prox  52      14      1-39%   heterogenous                         +----------+--------+--------+--------+--------------------------+----------+ ICA Distal96      31                                        steep dive +----------+--------+--------+--------+--------------------------+----------+ ECA       73      4               heterogenous and irregular           +----------+--------+--------+--------+--------------------------+----------+ +----------+--------+-------+----------------+-------------------+           PSV cm/sEDV cmsDescribe        Arm Pressure (mmHG) +----------+--------+-------+----------------+-------------------+ [id number]      0      Multiphasic, TWO852                 +----------+--------+-------+----------------+-------------------+ +---------+--------+--+--------+--+---------+ VertebralPSV cm/s42EDV cm/s12Antegrade +---------+--------+--+--------+--+---------+  Left Carotid Findings: +----------+--------+--------+--------+-----------------+----------------------+           PSV cm/sEDV cm/sStenosisPlaque           Comments  Description                             +----------+--------+--------+--------+-----------------+----------------------+ CCA Prox  54      11                                                       +----------+--------+--------+--------+-----------------+----------------------+ CCA Distal48      14      <50%    heterogenous                            +----------+--------+--------+--------+-----------------+----------------------+ ICA Prox  53      15      1-39%   heterogenous                            +----------+--------+--------+--------+-----------------+----------------------+ ICA Distal149     37                               tortuous and turbulent +----------+--------+--------+--------+-----------------+----------------------+ ECA       68      7               heterogenous                            +----------+--------+--------+--------+-----------------+----------------------+ +----------+--------+--------+----------------+-------------------+           PSV cm/sEDV cm/sDescribe        Arm Pressure (mmHG) +----------+--------+--------+----------------+-------------------+ Dlarojcpjw35      0       Multiphasic, TWO847                 +----------+--------+--------+----------------+-------------------+ +---------+--------+--+--------+--+---------+ VertebralPSV cm/s42EDV cm/s14Antegrade +---------+--------+--+--------+--+---------+   Summary: Right Carotid: Velocities in the right ICA are consistent with a 1-39% stenosis. Left Carotid: Velocities in the left ICA are consistent with a 1-39% stenosis. Vertebrals:  Bilateral vertebral arteries demonstrate antegrade flow. Subclavians: Normal flow hemodynamics were seen in bilateral subclavian              arteries. *See table(s) above for measurements and observations.  Electronically signed by Dorn Lesches MD on 09/12/2024 at 4:49:34 PM.    Final     All questions were answered. The patient knows to call the clinic with any problems, questions or concerns. I spent 30 minutes in the care of this patient including H and P, review of records, counseling and coordination of care.     Amber Stalls,  MD 09/13/2024 2:52 PM

## 2024-09-14 LAB — KAPPA/LAMBDA LIGHT CHAINS
Kappa free light chain: 77.7 mg/L — ABNORMAL HIGH (ref 3.3–19.4)
Kappa, lambda light chain ratio: 3.58 — ABNORMAL HIGH (ref 0.26–1.65)
Lambda free light chains: 21.7 mg/L (ref 5.7–26.3)

## 2024-09-15 LAB — IGG, IGA, IGM
IgA: 935 mg/dL — ABNORMAL HIGH (ref 61–437)
IgG (Immunoglobin G), Serum: 861 mg/dL (ref 603–1613)
IgM (Immunoglobulin M), Srm: 292 mg/dL — ABNORMAL HIGH (ref 15–143)

## 2024-09-15 LAB — BETA 2 MICROGLOBULIN, SERUM: Beta-2 Microglobulin: 7.3 mg/L — ABNORMAL HIGH (ref 0.6–2.4)

## 2024-09-16 LAB — ECHOCARDIOGRAM COMPLETE
AR max vel: 1.97 cm2
AV Area VTI: 2.33 cm2
AV Area mean vel: 2.03 cm2
AV Mean grad: 2 mmHg
AV Peak grad: 3.9 mmHg
Ao pk vel: 0.99 m/s
Area-P 1/2: 2.03 cm2
MV VTI: 1.33 cm2
S' Lateral: 2.91 cm

## 2024-09-17 ENCOUNTER — Other Ambulatory Visit: Payer: Self-pay

## 2024-09-17 DIAGNOSIS — D472 Monoclonal gammopathy: Secondary | ICD-10-CM

## 2024-09-17 DIAGNOSIS — I350 Nonrheumatic aortic (valve) stenosis: Secondary | ICD-10-CM

## 2024-09-17 DIAGNOSIS — N189 Chronic kidney disease, unspecified: Secondary | ICD-10-CM | POA: Diagnosis not present

## 2024-09-17 NOTE — Telephone Encounter (Signed)
 Spoke with pt regarding Echo results. Pt was advise to have an AAA duplex. Pt agreed. Pt was advised he will be contacted to schedule this test and to call our office if he had any further questions or concerns. Pt thanked me for the call.

## 2024-09-19 LAB — UPEP/TP, 24-HR URINE
Albumin, U: 19.8 %
Alpha 1, Urine: 3.7 %
Alpha 2, Urine: 20 %
Beta, Urine: 44.7 %
Gamma Globulin, Urine: 11.8 %
M-Spike, mg/24 hr: 111.4 mg/(24.h) — ABNORMAL HIGH
M-spike, %: 33.1 % — ABNORMAL HIGH
Total Protein, Urine-Ur/day: 337 mg/(24.h) — ABNORMAL HIGH (ref 30–150)
Total Protein, Urine: 18.7 mg/dL
Total Volume: 1800

## 2024-09-21 ENCOUNTER — Ambulatory Visit (HOSPITAL_COMMUNITY)
Admission: RE | Admit: 2024-09-21 | Discharge: 2024-09-21 | Disposition: A | Discharge status: Home / Self Care | Source: Ambulatory Visit | Attending: Hematology and Oncology | Admitting: Hematology and Oncology

## 2024-09-21 ENCOUNTER — Telehealth: Payer: Self-pay | Admitting: Cardiology

## 2024-09-21 DIAGNOSIS — D472 Monoclonal gammopathy: Secondary | ICD-10-CM | POA: Diagnosis present

## 2024-09-21 NOTE — Telephone Encounter (Signed)
 Daniel with [facility] called about final report and critical finding. On 09/06/24 at 8:28 am 1st documentation of A. Flutter 2 minutes 40 seconds average 111 bpm, patient did not report any symptoms. Will send to ordering provider.

## 2024-09-21 NOTE — Telephone Encounter (Signed)
 Daniel with [facility] calling to report critical monitor result

## 2024-09-24 NOTE — Progress Notes (Signed)
 Cardiology Office Note:  .   Date:  10/01/2024  ID:  Andrew Pacheco, DOB [birth year], [name] [id number] PCP: Yolande Toribio MATSU, MD  [facility] Providers Cardiologist:  Gordy Bergamo, MD    History of Present Illness: .   Andrew Pacheco is a 83 y.o. male   with a past medical history of CKD stage IIIb, protein calorie malnutrition, hypertension, anemia of chronic disease,  chronic back pain, hyperlipidemia, depression.    was previously seen by Dr. Bergamo and 08/2017 during an admission for dizziness, near syncope. Underwent echocardiogram 08/2017 that showed EF 60 to 65%, no regional wall motion abnormalities, mild aortic regurgitation. Carotid ultrasounds at that time showed 40-59% right ICA stenosis and 1-39% left ICA stenosis.  Patient was seen 09/05/24 with weakness, near syncope, bradycardia and labile BP. BP up in clinic. 7 day live monitor ordered. And echo. Monitor showed Afib 1% RVR lasting 3 min, freq PVC's 5.7% burden, bigeminy and trigeminy. Symptoms corrolated with PVC's.  Patient here with his wife. Doesn't feel well, very weak, He lost 6 lbs in the past month. 25 lbs in a year, 45 lbs 3 yrs. No appetite. Gets nauseated when he eats. Takes gabapentin prn for joint pain but it makes him sleepy. Being w/u at the cancer center for anemia/IgM elevation and having a bone marrow biopsy next week. Drinking gatorade-20 ounce but not much water . Taking hydrocodone  on empty stomach. Drinks 1 ensure a day.  ROS:    Studies Reviewed: SABRA         Prior CV Studies:    09/05/24 monitor Patch Wear Time:  6 days and 20 hours (2025-09-17T15:15:15-0400 to 2025-09-24T11:37:00-0400)   Patient had a min HR of 54 bpm, max HR of 169 bpm, and avg HR of 78 bpm. Predominant underlying rhythm was Sinus Rhythm. First Degree AV Block was present. 1 run of Supraventricular Tachycardia occurred lasting 4 beats with a max rate of 169 bpm (avg 155  bpm). Atrial Flutter occurred (<1% burden), ranging from 91-169  bpm (avg of 121 bpm), the longest lasting 2 mins 40 secs with an avg rate of 111 bpm. Supraventricular Tachycardia was detected within +/- 45 seconds of symptomatic patient event(s).  Isolated SVEs were rare (<1.0%), SVE Couplets were rare (<1.0%), and SVE Triplets were rare (<1.0%). Isolated VEs were frequent (5.7%, 44068), VE Couplets were rare (<1.0%, 20), and VE Triplets were rare (<1.0%, 1). Ventricular Bigeminy and Trigeminy  were present. MD notification criteria for First Documentation of Atrial Flutter met - report posted prior to notification (NM).  Echo 09/12/24 IMPRESSIONS     1. Left ventricular ejection fraction, by estimation, is 60 to 65%. The  left ventricle has normal function. Left ventricular endocardial border  not optimally defined to evaluate regional wall motion. There is mild left  ventricular hypertrophy. Left  ventricular diastolic parameters are indeterminate.   2. Right ventricular systolic function is normal. The right ventricular  size is normal. Tricuspid regurgitation signal is inadequate for assessing  PA pressure.   3. The mitral valve is degenerative. Trivial mitral valve regurgitation.  Though poorly assessed due to image quality, there is likely at least mild  mitral stenosis (MG 3 mmHg HR 74 bpm). Severe bulky mitral annular  calcification and leaflet thickening  with restricted motion.   4. The aortic valve is abnormal. There is moderate calcification of the  aortic valve. There is mild thickening of the aortic valve. Aortic valve  regurgitation is moderate. Aortic valve  sclerosis/calcification is  present, without any evidence of aortic  stenosis.   5. The inferior vena cava is normal in size with greater than 50%  respiratory variability, suggesting right atrial pressure of 3 mmHg.   6. 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 Consider dedicated abdominal aorta imaging if clinicaly indicated.   Carotids  09/12/24  Summary:  Right Carotid: Velocities in the right ICA are consistent with a 1-39%  stenosis.   Left Carotid: Velocities in the left ICA are consistent with a 1-39%  stenosis.   Vertebrals: Bilateral vertebral arteries demonstrate antegrade flow.  Subclavians: Normal flow hemodynamics were seen in bilateral subclavian               arteries.   *See table(s) above for measurements and observations.      Electronically signed by Dorn Lesches MD on 09/12/2024 at 4:49:34 PM.        Risk Assessment/Calculations:    CHA2DS2-VASc Score = 3   This indicates a 3.2% annual risk of stroke. The patient's score is based upon: CHF History: 0 HTN History: 1 Diabetes History: 0 Stroke History: 0 Vascular Disease History: 0 Age Score: 2 Gender Score: 0            Physical Exam:   VS:  BP 110/70   Ht 5' 11 (1.803 m)   Wt 129 lb (58.5 kg)   BMI 17.99 kg/m    Orhtostatics: No data found. Wt Readings from Last 3 Encounters:  10/01/24 129 lb (58.5 kg)  09/13/24 130 lb 4.8 oz (59.1 kg)  09/05/24 135 lb (61.2 kg)    GEN: Well nourished, well developed in no acute distress NECK: No JVD; No carotid bruits CARDIAC:  RRR, 2/6 systolic murmur LSB RESPIRATORY:  Clear to auscultation without rales, wheezing or rhonchi  ABDOMEN: Soft, non-tender, non-distended EXTREMITIES:  No edema; No deformity   ASSESSMENT AND PLAN: .    Weakness/near syncope/Labile BP/Bradycardia - live zio Aflutter 91-169/m lasting 2 min 40 sec, 1 run SVT. Min HR 54. Longest episode lasted 3 min less than 1% of time. Discussed with EPS. CHADSVASC=3. Will not commit to anticoag at this point with bone marrow biopsy next week, failure to thrive and afib/flutter less than 1%. -symptoms from PVC's. BP too low to add BB at this point. If he starts to gain weight and BP comes up consider BB    DOE  - Echo normal LVEF, mild LVH.  Carotid Artery Stenosis  - Carotid dopplers stable 1-39% stenosis bilateral      Iron Deficiency Anemia  - Followed by heme-onc.  Hemoglobin recently 9.6 on 9/10, elevateed IgM and IgA for bone marrow biopsy next week. - On iron supplementation   CKD stage IIIb - Creatinine 2.04 on 9/10 - Encouraged patient to increase oral hydration given his recent near syncopal episodes  Unintentional weight loss/failure to thrive -no appetite, little food, constipation. -refer to GI -w/u by heme/onc -take pain meds with food -64 ounces water  daily,          Dispo: f/u Dr. Ladona in 2-3 months  Signed, Olivia Pavy, PA-C

## 2024-09-28 ENCOUNTER — Telehealth: Payer: Self-pay | Admitting: Hematology and Oncology

## 2024-09-28 NOTE — Telephone Encounter (Signed)
 I spoke with patient and he is aware of appointment change on 10/04/2024 from 12:45pm to 12:15pm.

## 2024-09-30 DIAGNOSIS — R55 Syncope and collapse: Secondary | ICD-10-CM | POA: Diagnosis not present

## 2024-10-01 ENCOUNTER — Ambulatory Visit
Admission: RE | Admit: 2024-10-01 | Discharge: 2024-10-01 | Disposition: A | Discharge status: Home / Self Care | Source: Ambulatory Visit | Attending: Cardiology | Admitting: Cardiology

## 2024-10-01 ENCOUNTER — Ambulatory Visit: Admitting: Physician Assistant

## 2024-10-01 ENCOUNTER — Encounter: Payer: Self-pay | Admitting: Physician Assistant

## 2024-10-01 VITALS — BP 110/70 | Ht 71.0 in | Wt 129.0 lb

## 2024-10-01 DIAGNOSIS — I350 Nonrheumatic aortic (valve) stenosis: Secondary | ICD-10-CM | POA: Insufficient documentation

## 2024-10-01 DIAGNOSIS — R634 Abnormal weight loss: Secondary | ICD-10-CM | POA: Diagnosis present

## 2024-10-01 DIAGNOSIS — I6523 Occlusion and stenosis of bilateral carotid arteries: Secondary | ICD-10-CM | POA: Insufficient documentation

## 2024-10-01 DIAGNOSIS — R0609 Other forms of dyspnea: Secondary | ICD-10-CM | POA: Diagnosis not present

## 2024-10-01 DIAGNOSIS — R630 Anorexia: Secondary | ICD-10-CM | POA: Insufficient documentation

## 2024-10-01 DIAGNOSIS — I493 Ventricular premature depolarization: Secondary | ICD-10-CM | POA: Diagnosis not present

## 2024-10-01 DIAGNOSIS — D649 Anemia, unspecified: Secondary | ICD-10-CM

## 2024-10-01 DIAGNOSIS — N183 Chronic kidney disease, stage 3 unspecified: Secondary | ICD-10-CM

## 2024-10-01 DIAGNOSIS — E440 Moderate protein-calorie malnutrition: Secondary | ICD-10-CM | POA: Insufficient documentation

## 2024-10-01 DIAGNOSIS — I480 Paroxysmal atrial fibrillation: Secondary | ICD-10-CM | POA: Diagnosis present

## 2024-10-01 DIAGNOSIS — I77811 Abdominal aortic ectasia: Secondary | ICD-10-CM | POA: Insufficient documentation

## 2024-10-01 NOTE — Patient Instructions (Addendum)
 Thank you for choosing [facility]!     Medication Instructions:  Increase your hydration to 64 ounces daily.  A gastrology referral has been placed for you. You will receive a call for that appointment.  *If you need a refill on your cardiac medications before your next appointment, please call your pharmacy*   Lab Work: No procedures were ordered during today's visit.  If you have labs (blood work) drawn today and your tests are completely normal, you will receive your results only by: MyChart Message (if you have MyChart) OR A paper copy in the mail If you have any lab test that is abnormal or we need to change your treatment, we will call you to review the results.   Testing/Procedures: No procedures were ordered during today's visit.   Your next appointment:   2-3 month(s)   Provider:   Gordy Bergamo, MD     Follow-Up: At [facility], you and your health needs are our priority.  As part of our continuing mission to provide you with exceptional heart care, we have created designated Provider Care Teams.  These Care Teams include your primary Cardiologist (physician) and Advanced Practice Providers (APPs -  Physician Assistants and Nurse Practitioners) who all work together to provide you with the care you need, when you need it. We recommend signing up for the patient portal called MyChart.  Sign up information is provided on this After Visit Summary.  MyChart is used to connect with patients for Virtual Visits (Telemedicine).  Patients are able to view lab/test results, encounter notes, upcoming appointments, etc.  Non-urgent messages can be sent to your provider as well.   To learn more about what you can do with MyChart, go to [email or web address].

## 2024-10-03 ENCOUNTER — Telehealth: Payer: Self-pay

## 2024-10-03 NOTE — Telephone Encounter (Signed)
 Spoke with patient and confirmed appointment on 10/16

## 2024-10-04 ENCOUNTER — Inpatient Hospital Stay: Attending: Hematology and Oncology | Admitting: Hematology and Oncology

## 2024-10-04 ENCOUNTER — Inpatient Hospital Stay: Admitting: Hematology and Oncology

## 2024-10-04 VITALS — BP 141/74 | HR 76 | Temp 97.2°F | Resp 16 | Wt 132.2 lb

## 2024-10-04 DIAGNOSIS — Z808 Family history of malignant neoplasm of other organs or systems: Secondary | ICD-10-CM | POA: Insufficient documentation

## 2024-10-04 DIAGNOSIS — R634 Abnormal weight loss: Secondary | ICD-10-CM | POA: Insufficient documentation

## 2024-10-04 DIAGNOSIS — Z800 Family history of malignant neoplasm of digestive organs: Secondary | ICD-10-CM | POA: Insufficient documentation

## 2024-10-04 DIAGNOSIS — N1832 Chronic kidney disease, stage 3b: Secondary | ICD-10-CM | POA: Insufficient documentation

## 2024-10-04 DIAGNOSIS — D649 Anemia, unspecified: Secondary | ICD-10-CM | POA: Insufficient documentation

## 2024-10-04 DIAGNOSIS — D472 Monoclonal gammopathy: Secondary | ICD-10-CM | POA: Insufficient documentation

## 2024-10-04 NOTE — Progress Notes (Signed)
 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 Protein level at 0.7 grams, no significant bone lesions, small abnormal M protein in urine. - Perform bone marrow biopsy next week to assess plasma cell percentage. - Order PET scan if bone marrow biopsy does not confirm myeloma. - Cancel PET scan if bone marrow biopsy confirms myeloma.  Anemia, unspecified Chronic anemia with hemoglobin at 9.6, improved from 2022. No clear etiology, not fully attributable to monoclonal protein. - Evaluate bone marrow biopsy results for further insights into anemia etiology.  Unintentional weight loss Ongoing unintentional weight loss with recent stabilization. Possible factors include chronic pain and decreased appetite. - Monitor weight and nutritional intake.  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 Discussion about renal protective medications like Farxiga, - Discuss kidney function monitoring and potential renal protective medications with primary care provider, Dr. Toribio Gander.   HISTORY OF PRESENTING ILLNESS:  Andrew Pacheco 83 y.o. male is here because of anemia and weight loss  Discussed the use of AI scribe software for clinical note transcription with the patient, who gave verbal consent to proceed.  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 He is accompanied by his caregiver, who is also his spouse. He was referred by Dr. Toribio Gander for evaluation of his chronic kidney disease and weight loss.  He has been experiencing significant weight loss and lack of appetite. Despite eating, he is unable to retain weight and has no energy. His weight has been fluctuating, with a recent gain of three pounds, but overall, he has lost weight since his last visit.  He has a history of anemia, with a current hemoglobin level of 9.6, which is an improvement from previous levels. A gastrointestinal bleed in 2022 may have contributed to his anemia at that time.  He has been diagnosed with IgA kappa monoclonal gammopathy of undetermined significance (MGUS) and is leaking a small quantity of M protein in his urine, approximately 111 mg. A bone marrow biopsy is scheduled for next week to further investigate the presence of plasma cells.  He has stage 3B chronic kidney disease, with stable creatinine levels at 1.9 since November 2024. There is no recent blood work available from his primary care provider to compare kidney function over the past three years.  He has a history of chronic back pain and multiple surgeries, which may be contributing to his decreased appetite and weight loss. He takes pain medication for his back, and his caregiver notes that he has been trying to drink more fluids and eat as much as possible.  His caregiver mentions that his son and daughter-in-law were involved in a serious motorcycle accident, which has been a source of stress for the family.  All other systems were reviewed with the patient and are negative.  MEDICAL HISTORY:  Past Medical History:  Diagnosis Date  Anemia    Arthritis    both hips, back (09/09/2017)   Chronic back pain    middle and lower back (09/09/2017)   Colon polyps    Daily headache    Diverticulosis    GERD (gastroesophageal reflux disease)    Glaucoma, both eyes    H  pylori ulcer    Hard of hearing    HTN (hypertension)    Insomnia    due to disc   Lumbar disc disease    Migraine    q 3-4 months (09/09/2017)   Seasonal allergies     SURGICAL HISTORY: Past Surgical History:  Procedure Laterality Date   APPENDECTOMY     CYST EXCISION Right ~ 1971   knee   ESOPHAGOGASTRODUODENOSCOPY N/A 01/15/2018   Procedure: ESOPHAGOGASTRODUODENOSCOPY (EGD);  Surgeon: Aneita Gwendlyn DASEN, MD;  Location: [facility] ENDOSCOPY;  Service: Endoscopy;  Laterality: N/A;   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   Procedure: TOTAL HIP ARTHROPLASTY ANTERIOR APPROACH;  Surgeon: Fidel Rogue, MD;  Location: [facility] ORS;  Service: Orthopedics;  Laterality: Right;    SOCIAL HISTORY: Social History   Socioeconomic History   Marital status: Married    Spouse name: Not on file   Number of children: 2   Years of education: Not on file   Highest education level: Not on file  Occupational History   Occupation: security guard - [employer]  Tobacco Use   Smoking status: Never   Smokeless tobacco: Never  Vaping Use   Vaping status: Never Used  Substance and Sexual Activity   Alcohol  use: Yes    Comment: 09/09/2017 couple glasses of wine q 6 months   Drug use: No   Sexual activity: Not on file  Other Topics Concern   Not on file  Social History Narrative   Not on file   Social Drivers of Health   Financial Resource Strain: Not on file  Food Insecurity: Not on file  Transportation Needs: Not on file  Physical Activity: Not on file  Stress: Not on file  Social Connections: Not on file  Intimate Partner Violence: Not on file    FAMILY HISTORY: Family History  Problem Relation Age of Onset   Heart disease Father    Emphysema Father    Diverticulosis Father    Cancer Mother        gallbladder-mets   Arthritis Mother    Liver cancer Mother     ALLERGIES:  has no known allergies.  MEDICATIONS:  Current Outpatient Medications  Medication Sig Dispense  Refill   COMBIGAN  0.2-0.5 % ophthalmic solution Place 1 drop into both eyes 2 (two) times daily.  2   cyclobenzaprine (FLEXERIL) 10 MG tablet Take 10 mg by mouth as needed for muscle spasms.     diazepam  (VALIUM ) 5 MG tablet Take one tablet by mouth with food one hour prior to procedure. May repeat 30 minutes prior if needed. 2 tablet 0   feeding supplement, ENSURE ENLIVE, (ENSURE ENLIVE) LIQD Take 237 mLs by mouth 2 (two) times daily between meals. 237 mL 0   HYDROcodone -acetaminophen  (NORCO) 7.5-325 MG tablet Take 1-2 tablets by mouth every 4 (four) hours as needed for severe pain (pain score 7-10). 30 tablet 0   ipratropium (ATROVENT ) 0.06 % nasal spray Place 2 sprays into both nostrils as needed for rhinitis.     pantoprazole  (PROTONIX ) 40 MG tablet Take 1 tablet (40 mg total) by mouth daily. Office visit for further refills 90 tablet 0  polyethylene glycol (MIRALAX  / GLYCOLAX ) 17 g packet Take 17 g by mouth daily. 14 each 0   Prenatal Vit-Fe Fumarate-FA (PRENATAL VITAMINS) 28-0.8 MG TABS Take 1 tablet by mouth daily.  12   senna-docusate (SENOKOT-S) 8.6-50 MG tablet Take 2 tablets by mouth 2 (two) times daily.     travoprost , benzalkonium, (TRAVATAN ) 0.004 % ophthalmic solution Place 1 drop into both eyes at bedtime.     zolpidem  (AMBIEN ) 10 MG tablet Take 1 tablet (10 mg total) by mouth at bedtime as needed for sleep. 15 tablet 0   No current facility-administered medications for this visit.     PHYSICAL EXAMINATION: ECOG PERFORMANCE STATUS: 2 - Symptomatic, <50% confined to bed  Vitals:   10/04/24 1211  BP: (!) 141/74  Pulse: 76  Resp: 16  Temp: (!) 97.2 F (36.2 C)  SpO2: 96%    Filed Weights   10/04/24 1211  Weight: 132 lb 3.2 oz (60 kg)     GENERAL:alert, no distress and comfortable  LABORATORY DATA:  I have reviewed the data as listed Lab Results  Component Value Date   WBC 6.2 09/13/2024   HGB 9.8 (L) 09/13/2024   HCT 29.5 (L) 09/13/2024   MCV 93.7  09/13/2024   PLT 180 09/13/2024     Chemistry      Component Value Date/Time   NA 137 08/29/2024 1217   K 4.8 08/29/2024 1217   CL 103 08/29/2024 1217   CO2 26 08/29/2024 1217   BUN 37 (H) 08/29/2024 1217   CREATININE 2.04 (H) 08/29/2024 1217      Component Value Date/Time   CALCIUM  9.5 08/29/2024 1217   ALKPHOS 71 08/29/2024 1217   AST 14 (L) 08/29/2024 1217   ALT 8 08/29/2024 1217   BILITOT 0.4 08/29/2024 1217       RADIOGRAPHIC STUDIES: I have personally reviewed the radiological images as listed and agreed with the findings in the report. VAS US  AAA DUPLEX Result Date: 10/01/2024 ABDOMINAL AORTA STUDY Patient Name:  TREMOND SHIMABUKURO  Date of Exam:   10/01/2024 Medical Rec #: [id number]      Accession #:    [id number] Date of Birth: 1941-10-23       Patient Gender: M Patient Age:   24 years Exam Location:  [address] Procedure:      VAS US  AAA DUPLEX Referring Phys: ROLLO LOUDER --------------------------------------------------------------------------------  Indications: Follow up exam for known AAA. Risk Factors: Hypertension, no history of smoking. Other Factors: Patient mentioned weight loss of approx 80 lbs. Endorses fear of                eating for approx 1 year.SABRA Spiro most of weight loss in the                past year.  Comparison Study: Echo 09/12/24: 6. Imaging findings suggesting dilated abdominal                   aorta, possibly up to 4 cm. Consider dedicated abdominal aorta                   imaging if clinicaly indicated. Performing Technologist: King Pierre RVT  Examination Guidelines: A complete evaluation includes B-mode imaging, spectral Doppler, color Doppler, and power Doppler as needed of all accessible portions of each vessel. Bilateral testing is considered an integral part of a complete examination. Limited examinations for reoccurring indications may be performed as noted.  Abdominal Aorta Findings:  +-------------+-------+----------+----------+---------+--------+--------+  Location     AP (cm)Trans (cm)PSV (cm/s)Waveform ThrombusComments +-------------+-------+----------+----------+---------+--------+--------+ Proximal     3.40   3.10      56                                  +-------------+-------+----------+----------+---------+--------+--------+ Mid          3.16   3.32      40                                  +-------------+-------+----------+----------+---------+--------+--------+ Distal       2.49   2.64                                          +-------------+-------+----------+----------+---------+--------+--------+ RT CIA Prox  1.4    1.3       63        triphasic                 +-------------+-------+----------+----------+---------+--------+--------+ RT CIA Distal                 71        biphasic                  +-------------+-------+----------+----------+---------+--------+--------+ RT EIA Prox                   69        biphasic                  +-------------+-------+----------+----------+---------+--------+--------+ RT EIA Distal                 71        biphasic                  +-------------+-------+----------+----------+---------+--------+--------+ LT CIA Prox  1.1    1.1       64        triphasic                 +-------------+-------+----------+----------+---------+--------+--------+ LT CIA Distal                 99        biphasic                  +-------------+-------+----------+----------+---------+--------+--------+ LT EIA Prox                   66        biphasic                  +-------------+-------+----------+----------+---------+--------+--------+ LT EIA Distal                 66        biphasic                  +-------------+-------+----------+----------+---------+--------+--------+ Celiac 245/55 cm/s Proximal SMA 398/1 cm/s Mid SMA 131/0 cm/s and turbulent Distal SMA 81/0 cm/s  IMA 240/20 cm/s.  Summary: Abdominal Aorta: Dilatation of the abdominal aorta throughout. 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 Mid and distal SMA demonstrate waveforms suggesting proximal obstruction.  *See table(s) above for measurements and observations.  Electronically signed by Timothy Gollan MD on 10/01/2024 at 9:00:01  PM.    Final    LONG TERM MONITOR-LIVE TELEMETRY (3-14 DAYS) Result Date: 09/30/2024 Images from the original result were not included. Zio patch monitoring 7 days for syncope and collapse starting 09/05/2024: Predominant underlying rhythm is sinus rhythm, minimum heart rate 54 bpm, maximum heart rate 142 bpm with average heart rate of 78 bpm. First-degree AV block was present. Atrial fibrillation (1% burden) with RVR, heart rate ranging from 91 to 169 bpm, longest lasting 3 minutes. Occasional PACs, rare atrial couplets and triplets were present. Frequent isolated PVCs (burden 5.7%). Ventricular bigeminy (5.1 seconds) and ventricular trigeminy (40 seconds) were present. Patient's symptomatic events correlated with PVCs. Atrial fibrillation: Patch Wear Time:  6 days and 20 hours (2025-09-17T15:15:15-0400 to 2025-09-24T11:37:00-0400)  DG Bone Survey Met Result Date: 09/26/2024 CLINICAL DATA:  staging myeloma, MGUS (monoclonal gammopathy of unknown significance) EXAM: METASTATIC BONE SURVEY COMPARISON:  None Available. FINDINGS: No focal lytic or blastic bone lesions are identified. Degenerative changes are seen within the cervical spine. Degenerative changes are seen within the thoracic spine with remote superior endplate fractures T11 and T12 with mild loss of height. Remote appearing superior endplate fractures L2 and L4 are seen with mild loss of height and no retropulsion. Degenerative changes are seen at the lumbosacral junction. Lungs are clear. Cardiac size within normal limits. Moderate stool throughout the colon without evidence of  obstruction. 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 IMPRESSION: 1. No focal lytic or blastic bone lesions identified. 2. Remote appearing superior endplate fractures T11, T12, L2, and L4. Electronically Signed   By: Dorethia Molt M.D.   On: 09/26/2024 23:38   ECHOCARDIOGRAM COMPLETE Result Date: 09/16/2024    ECHOCARDIOGRAM REPORT   Patient Name:   GILES CURRIE   Date of Exam: 09/12/2024 Medical Rec #:  [id number]       Height:       69.0 in Accession #:    [id number]      Weight:       135.0 lb Date of Birth:  September 24, 1941        BSA:          1.748 m Patient Age:    83 years        BP:           148/83 mmHg Patient Gender: M               HR:           73 bpm. Exam Location:  [address] Procedure: 2D Echo, Cardiac Doppler and Color Doppler (Both Spectral and Color            Flow Doppler were utilized during procedure). Indications:    Near syncope [R55 (ICD-10-CM)]  History:        Patient has prior history of Echocardiogram examinations, most                 recent 09/11/2017.  Sonographer:    Rosaline Fujisawa MHA, RDMS, RVT, RDCS Referring Phys: [id number] ROLLO JONELLE LOUDER  Sonographer Comments: Technically difficult study due to poor echo windows and suboptimal apical window. Image acquisition challenging due to patient body habitus and Image acquisition challenging due to respiratory motion. IMPRESSIONS  1. Left ventricular ejection fraction, by estimation, is 60 to 65%. The left ventricle has normal function. Left ventricular endocardial border not optimally defined to evaluate regional wall motion. There is mild left ventricular hypertrophy. Left ventricular diastolic parameters are indeterminate.  2. Right ventricular systolic  function is normal. The right ventricular size is normal. Tricuspid regurgitation signal is inadequate for assessing PA pressure.  3. The mitral valve is degenerative. Trivial mitral valve regurgitation. Though poorly assessed due to image quality, there is likely at  least mild mitral stenosis (MG 3 mmHg HR 74 bpm). Severe bulky mitral annular calcification and leaflet thickening with restricted motion.  4. The aortic valve is abnormal. There is moderate calcification of the aortic valve. There is mild thickening of the aortic valve. Aortic valve regurgitation is moderate. Aortic valve sclerosis/calcification is present, without any evidence of aortic stenosis.  5. The inferior vena cava is normal in size with greater than 50% respiratory variability, suggesting right atrial pressure of 3 mmHg.  6. Imaging findings suggesting dilated abdominal aorta, possibly up to 4 cm. Consider dedicated abdominal aorta imaging if clinicaly indicated. FINDINGS  Left Ventricle: Left ventricular ejection fraction, by estimation, is 60 to 65%. The left ventricle has normal function. Left ventricular endocardial border not optimally defined to evaluate regional wall motion. The left ventricular internal cavity size was normal in size. There is mild left ventricular hypertrophy. Abnormal (paradoxical) septal motion, consistent with left bundle branch block. Left ventricular diastolic parameters are indeterminate. Right Ventricle: The right ventricular size is normal. Right vetricular wall thickness was not well visualized. Right ventricular systolic function is normal. Tricuspid regurgitation signal is inadequate for assessing PA pressure. Left Atrium: Left atrial size was normal in size. Right Atrium: Right atrial size was normal in size. Pericardium: There is no evidence of pericardial effusion. Mitral Valve: The mitral valve is degenerative in appearance. Severe mitral annular calcification. Trivial mitral valve regurgitation. Mild mitral valve stenosis. MV peak gradient, 6.7 mmHg. The mean mitral valve gradient is 3.0 mmHg. Tricuspid Valve: The tricuspid valve is normal in structure. Tricuspid valve regurgitation is trivial. No evidence of tricuspid stenosis. Aortic Valve: The aortic valve is  abnormal. There is moderate calcification of the aortic valve. There is mild thickening of the aortic valve. Aortic valve regurgitation is moderate. Aortic valve sclerosis/calcification is present, without any evidence of aortic stenosis. Aortic valve mean gradient measures 2.0 mmHg. Aortic valve peak gradient measures 3.9 mmHg. Aortic valve area, by VTI measures 2.33 cm. Pulmonic Valve: The pulmonic valve was normal in structure. Pulmonic valve regurgitation is not visualized. No evidence of pulmonic stenosis. Aorta: The aortic root is normal in size and structure. Venous: The inferior vena cava is normal in size with greater than 50% respiratory variability, suggesting right atrial pressure of 3 mmHg. IAS/Shunts: No atrial level shunt detected by color flow Doppler.  LEFT VENTRICLE PLAX 2D LVIDd:         4.15 cm LVIDs:         2.91 cm LV PW:         1.14 cm LV IVS:        0.96 cm LVOT diam:     1.77 cm LV SV:         40 LV SV Index:   23 LVOT Area:     2.46 cm  IVC IVC diam: 1.77 cm LEFT ATRIUM         Index LA diam:    2.43 cm 1.39 cm/m  AORTIC VALVE AV Area (Vmax):    1.97 cm AV Area (Vmean):   2.03 cm AV Area (VTI):     2.33 cm AV Vmax:           98.70 cm/s AV Vmean:  63.400 cm/s AV VTI:            0.173 m AV Peak Grad:      3.9 mmHg AV Mean Grad:      2.0 mmHg LVOT Vmax:         79.10 cm/s LVOT Vmean:        52.400 cm/s LVOT VTI:          0.164 m LVOT/AV VTI ratio: 0.95  AORTA Ao Root diam: 3.30 cm Ao Asc diam:  3.60 cm MITRAL VALVE MV Area (PHT): 2.03 cm     SHUNTS MV Area VTI:   1.33 cm     Systemic VTI:  0.16 m MV Peak grad:  6.7 mmHg     Systemic Diam: 1.77 cm MV Mean grad:  3.0 mmHg MV Vmax:       1.29 m/s MV Vmean:      74.6 cm/s MV Decel Time: 373 msec MV E velocity: 102.90 cm/s MV A velocity: 140.00 cm/s MV E/A ratio:  0.74 Soyla Merck MD Electronically signed by Soyla Merck MD Signature Date/Time: 09/16/2024/4:35:10 PM    Final    VAS US  CAROTID Result Date:  09/12/2024 Carotid Arterial Duplex Study Patient Name:  DAREL RICKETTS  Date of Exam:   09/12/2024 Medical Rec #: [id number]      Accession #:    [id number] Date of Birth: [birth year]       Patient Gender: M Patient Age:   79 years Exam Location:  [address] Procedure:      VAS US  CAROTID Referring Phys: ROLLO LOUDER --------------------------------------------------------------------------------  Indications:  Carotid artery disease and patient presents with complaints of               overall weakness, dizziness and light headiness since the end of               July 2025. He denies any other cerebrovascular symptoms. Risk Factors: Hypertension, no history of smoking. Performing Technologist: Nanetta Shad RVT  Examination Guidelines: A complete evaluation includes B-mode imaging, spectral Doppler, color Doppler, and power Doppler as needed of all accessible portions of each vessel. Bilateral testing is considered an integral part of a complete examination. Limited examinations for reoccurring indications may be performed as noted.  Right Carotid Findings: +----------+--------+--------+--------+--------------------------+----------+           PSV cm/sEDV cm/sStenosisPlaque Description        Comments   +----------+--------+--------+--------+--------------------------+----------+ CCA Prox  51      7                                                    +----------+--------+--------+--------+--------------------------+----------+ CCA Distal68      17      <50%    heterogenous                         +----------+--------+--------+--------+--------------------------+----------+ ICA Prox  52      14      1-39%   heterogenous                         +----------+--------+--------+--------+--------------------------+----------+ ICA Distal96      31  steep dive +----------+--------+--------+--------+--------------------------+----------+ ECA        73      4               heterogenous and irregular           +----------+--------+--------+--------+--------------------------+----------+ +----------+--------+-------+----------------+-------------------+           PSV cm/sEDV cmsDescribe        Arm Pressure (mmHG) +----------+--------+-------+----------------+-------------------+ [id number]      0      Multiphasic, TWO852                 +----------+--------+-------+----------------+-------------------+ +---------+--------+--+--------+--+---------+ VertebralPSV cm/s42EDV cm/s12Antegrade +---------+--------+--+--------+--+---------+  Left Carotid Findings: +----------+--------+--------+--------+-----------------+----------------------+           PSV cm/sEDV cm/sStenosisPlaque           Comments                                                 Description                             +----------+--------+--------+--------+-----------------+----------------------+ CCA Prox  54      11                                                      +----------+--------+--------+--------+-----------------+----------------------+ CCA Distal48      14      <50%    heterogenous                            +----------+--------+--------+--------+-----------------+----------------------+ ICA Prox  53      15      1-39%   heterogenous                            +----------+--------+--------+--------+-----------------+----------------------+ ICA Distal149     37                               tortuous and turbulent +----------+--------+--------+--------+-----------------+----------------------+ ECA       68      7               heterogenous                            +----------+--------+--------+--------+-----------------+----------------------+ +----------+--------+--------+----------------+-------------------+           PSV cm/sEDV cm/sDescribe        Arm Pressure (mmHG)  +----------+--------+--------+----------------+-------------------+ Dlarojcpjw35      0       Multiphasic, [id number]                 +----------+--------+--------+----------------+-------------------+ +---------+--------+--+--------+--+---------+ VertebralPSV cm/s42EDV cm/s14Antegrade +---------+--------+--+--------+--+---------+   Summary: Right Carotid: Velocities in the right ICA are consistent with a 1-39% stenosis. Left Carotid: Velocities in the left ICA are consistent with a 1-39% stenosis. Vertebrals:  Bilateral vertebral arteries demonstrate antegrade flow. Subclavians: Normal flow hemodynamics were seen in bilateral subclavian              arteries. *See table(s) above for measurements and observations.  Electronically signed by Dorn Lesches MD on 09/12/2024 at 4:49:34 PM.    Final     All questions were answered. The patient knows to call the clinic with any problems, questions or concerns. I spent 30 minutes in the care of this patient including H and P, review of records, counseling and coordination of care.     Amber Stalls, MD 10/04/2024 12:16 PM

## 2024-10-09 ENCOUNTER — Ambulatory Visit: Payer: Self-pay | Admitting: Cardiology

## 2024-10-09 NOTE — Progress Notes (Signed)
 I think he has mesenteric ischemia and he is 83 and frail and would worry about instrumentation, but if needed abdominal CTA for mesenteric ischemia can be ordered unless renal issue present

## 2024-10-11 ENCOUNTER — Other Ambulatory Visit: Payer: Self-pay | Admitting: Radiology

## 2024-10-11 DIAGNOSIS — D472 Monoclonal gammopathy: Secondary | ICD-10-CM

## 2024-10-11 NOTE — H&P (Signed)
 Chief Complaint: Anemia, weight loss, IgA monoclonal gammopathy of undetermined significance; referred for image guided bone marrow biopsy to rule out myeloma  Referring Provider(s): Iruku,P  Supervising Physician: Luverne Aran  Patient Status: [facility] - Out-pt  History of Present Illness: Andrew Pacheco is an 83 y.o. male with past medical history significant for arthritis, chronic back pain, colon polyps, diverticulosis, compression fractures, GERD, glaucoma, hearing difficulty, hypertension, chronic kidney disease, anemia and IgA monoclonal gammopathy of undetermined significance.  He is scheduled today for image guided bone marrow biopsy for further evaluation/rule out myeloma.  *** Patient is Full Code  Past Medical History:  Diagnosis Date   Anemia    Arthritis    both hips, back (09/09/2017)   Chronic back pain    middle and lower back (09/09/2017)   Colon polyps    Daily headache    Diverticulosis    GERD (gastroesophageal reflux disease)    Glaucoma, both eyes    H pylori ulcer    Hard of hearing    HTN (hypertension)    Insomnia    due to disc   Lumbar disc disease    Migraine    q 3-4 months (09/09/2017)   Seasonal allergies     Past Surgical History:  Procedure Laterality Date   APPENDECTOMY     CYST EXCISION Right ~ 1971   knee   ESOPHAGOGASTRODUODENOSCOPY N/A 01/15/2018   Procedure: ESOPHAGOGASTRODUODENOSCOPY (EGD);  Surgeon: Aneita Gwendlyn DASEN, MD;  Location: [facility] ENDOSCOPY;  Service: Endoscopy;  Laterality: N/A;   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   Procedure: TOTAL HIP ARTHROPLASTY ANTERIOR APPROACH;  Surgeon: Fidel Rogue, MD;  Location: [facility] ORS;  Service: Orthopedics;  Laterality: Right;    Allergies: Patient has no known allergies.  Medications: Prior to Admission medications   Medication Sig Start Date End Date Taking? Authorizing Provider  COMBIGAN  0.2-0.5 % ophthalmic solution Place 1 drop into both eyes 2 (two) times daily.  01/10/18   [provider]  cyclobenzaprine (FLEXERIL) 10 MG tablet Take 10 mg by mouth as needed for muscle spasms. 12/11/19   [provider]  diazepam  (VALIUM ) 5 MG tablet Take one tablet by mouth with food one hour prior to procedure. May repeat 30 minutes prior if needed. 10/28/22   Williams, Megan E, NP  feeding supplement, ENSURE ENLIVE, (ENSURE ENLIVE) LIQD Take 237 mLs by mouth 2 (two) times daily between meals. 09/12/17   Sebastian Toribio GAILS, MD  HYDROcodone -acetaminophen  (NORCO) 7.5-325 MG tablet Take 1-2 tablets by mouth every 4 (four) hours as needed for severe pain (pain score 7-10). 07/10/21   Logan Martinis B, PA-C  ipratropium (ATROVENT ) 0.06 % nasal spray Place 2 sprays into both nostrils as needed for rhinitis.    [provider]  pantoprazole  (PROTONIX ) 40 MG tablet Take 1 tablet (40 mg total) by mouth daily. Office visit for further refills 04/01/23   Abran Norleen SAILOR, MD  polyethylene glycol (MIRALAX  / GLYCOLAX ) 17 g packet Take 17 g by mouth daily. 07/14/21   Krishnan, Gokul, MD  Prenatal Vit-Fe Fumarate-FA (PRENATAL VITAMINS) 28-0.8 MG TABS Take 1 tablet by mouth daily. 01/26/18   [provider]  senna-docusate (SENOKOT-S) 8.6-50 MG tablet Take 2 tablets by mouth 2 (two) times daily. 07/13/21   Krishnan, Gokul, MD  travoprost , benzalkonium, (TRAVATAN ) 0.004 % ophthalmic solution Place 1 drop into both eyes at bedtime.    [provider]  zolpidem  (AMBIEN ) 10 MG tablet Take 1 tablet (10 mg total)  by mouth at bedtime as needed for sleep. 07/13/21   Verdene Purchase, MD     Family History  Problem Relation Age of Onset   Heart disease Father    Emphysema Father    Diverticulosis Father    Cancer Mother        gallbladder-mets   Arthritis Mother    Liver cancer Mother     Social History   Socioeconomic History   Marital status: Married    Spouse name: Not on file   Number of children: 2   Years of education: Not on file   Highest  education level: Not on file  Occupational History   Occupation: security guard - [employer]  Tobacco Use   Smoking status: Never   Smokeless tobacco: Never  Vaping Use   Vaping status: Never Used  Substance and Sexual Activity   Alcohol  use: Yes    Comment: 09/09/2017 couple glasses of wine q 6 months   Drug use: No   Sexual activity: Not on file  Other Topics Concern   Not on file  Social History Narrative   Not on file   Social Drivers of Health   Financial Resource Strain: Not on file  Food Insecurity: Not on file  Transportation Needs: Not on file  Physical Activity: Not on file  Stress: Not on file  Social Connections: Not on file       Review of Systems  Vital Signs:   Advance Care Plan: no documents on file    Physical Exam  Imaging: VAS US  AAA DUPLEX Result Date: 10/01/2024 ABDOMINAL AORTA STUDY Patient Name:  Andrew Pacheco  Date of Exam:   10/01/2024 Medical Rec #: [id number]      Accession #:    [id number] Date of Birth: 1941-04-15       Patient Gender: M Patient Age:   47 years Exam Location:  [address] Procedure:      VAS US  AAA DUPLEX Referring Phys: ROLLO LOUDER --------------------------------------------------------------------------------  Indications: Follow up exam for known AAA. Risk Factors: Hypertension, no history of smoking. Other Factors: Patient mentioned weight loss of approx 80 lbs. Endorses fear of                eating for approx 1 year.SABRA Spiro most of weight loss in the                past year.  Comparison Study: Echo 09/12/24: 6. Imaging findings suggesting dilated abdominal                   aorta, possibly up to 4 cm. Consider dedicated abdominal aorta                   imaging if clinicaly indicated. Performing Technologist: King Pierre RVT  Examination Guidelines: A complete evaluation includes B-mode imaging, spectral Doppler, color Doppler, and power Doppler as needed of all accessible portions of each  vessel. Bilateral testing is considered an integral part of a complete examination. Limited examinations for reoccurring indications may be performed as noted.  Abdominal Aorta Findings: +-------------+-------+----------+----------+---------+--------+--------+ Location     AP (cm)Trans (cm)PSV (cm/s)Waveform ThrombusComments +-------------+-------+----------+----------+---------+--------+--------+ Proximal     3.40   3.10      56                                  +-------------+-------+----------+----------+---------+--------+--------+ Mid  3.16   3.32      40                                  +-------------+-------+----------+----------+---------+--------+--------+ Distal       2.49   2.64                                          +-------------+-------+----------+----------+---------+--------+--------+ RT CIA Prox  1.4    1.3       63        triphasic                 +-------------+-------+----------+----------+---------+--------+--------+ RT CIA Distal                 71        biphasic                  +-------------+-------+----------+----------+---------+--------+--------+ RT EIA Prox                   69        biphasic                  +-------------+-------+----------+----------+---------+--------+--------+ RT EIA Distal                 71        biphasic                  +-------------+-------+----------+----------+---------+--------+--------+ LT CIA Prox  1.1    1.1       64        triphasic                 +-------------+-------+----------+----------+---------+--------+--------+ LT CIA Distal                 99        biphasic                  +-------------+-------+----------+----------+---------+--------+--------+ LT EIA Prox                   66        biphasic                  +-------------+-------+----------+----------+---------+--------+--------+ LT EIA Distal                 66        biphasic                   +-------------+-------+----------+----------+---------+--------+--------+ Celiac 245/55 cm/s Proximal SMA 398/1 cm/s Mid SMA 131/0 cm/s and turbulent Distal SMA 81/0 cm/s IMA 240/20 cm/s.  Summary: Abdominal Aorta: Dilatation of the abdominal aorta throughout. 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 Mid and distal SMA demonstrate waveforms suggesting proximal obstruction.  *See table(s) above for measurements and observations.  Electronically signed by Evalene Lunger MD on 10/01/2024 at 9:00:01 PM.    Final    LONG TERM MONITOR-LIVE TELEMETRY (3-14 DAYS) Result Date: 09/30/2024 Images from the original result were not included. Zio patch monitoring 7 days for syncope and collapse starting 09/05/2024: Predominant underlying rhythm is sinus rhythm, minimum heart rate 54 bpm, maximum heart rate 142 bpm with average heart rate of 78 bpm. First-degree AV block was present. Atrial fibrillation (1% burden) with RVR,  heart rate ranging from 91 to 169 bpm, longest lasting 3 minutes. Occasional PACs, rare atrial couplets and triplets were present. Frequent isolated PVCs (burden 5.7%). Ventricular bigeminy (5.1 seconds) and ventricular trigeminy (40 seconds) were present. Patient's symptomatic events correlated with PVCs. Atrial fibrillation: Patch Wear Time:  6 days and 20 hours (2025-09-17T15:15:15-0400 to 2025-09-24T11:37:00-0400)  DG Bone Survey Met Result Date: 09/26/2024 CLINICAL DATA:  staging myeloma, MGUS (monoclonal gammopathy of unknown significance) EXAM: METASTATIC BONE SURVEY COMPARISON:  None Available. FINDINGS: No focal lytic or blastic bone lesions are identified. Degenerative changes are seen within the cervical spine. Degenerative changes are seen within the thoracic spine with remote superior endplate fractures T11 and T12 with mild loss of height. Remote appearing superior endplate fractures L2 and L4 are seen with mild loss of height and  no retropulsion. Degenerative changes are seen at the lumbosacral junction. Lungs are clear. Cardiac size within normal limits. Moderate stool throughout the colon without evidence of obstruction. 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 IMPRESSION: 1. No focal lytic or blastic bone lesions identified. 2. Remote appearing superior endplate fractures T11, T12, L2, and L4. Electronically Signed   By: Dorethia Molt M.D.   On: 09/26/2024 23:38   ECHOCARDIOGRAM COMPLETE Result Date: 09/16/2024    ECHOCARDIOGRAM REPORT   Patient Name:   Andrew Pacheco   Date of Exam: 09/12/2024 Medical Rec #:  [id number]       Height:       69.0 in Accession #:    [id number]      Weight:       135.0 lb Date of Birth:  1941/05/20        BSA:          1.748 m Patient Age:    83 years        BP:           148/83 mmHg Patient Gender: M               HR:           73 bpm. Exam Location:  [address] Procedure: 2D Echo, Cardiac Doppler and Color Doppler (Both Spectral and Color            Flow Doppler were utilized during procedure). Indications:    Near syncope [R55 (ICD-10-CM)]  History:        Patient has prior history of Echocardiogram examinations, most                 recent 09/11/2017.  Sonographer:    Rosaline Fujisawa MHA, RDMS, RVT, RDCS Referring Phys: [id number] ROLLO JONELLE LOUDER  Sonographer Comments: Technically difficult study due to poor echo windows and suboptimal apical window. Image acquisition challenging due to patient body habitus and Image acquisition challenging due to respiratory motion. IMPRESSIONS  1. Left ventricular ejection fraction, by estimation, is 60 to 65%. The left ventricle has normal function. Left ventricular endocardial border not optimally defined to evaluate regional wall motion. There is mild left ventricular hypertrophy. Left ventricular diastolic parameters are indeterminate.  2. Right ventricular systolic function is normal. The right ventricular size is normal. Tricuspid regurgitation  signal is inadequate for assessing PA pressure.  3. The mitral valve is degenerative. Trivial mitral valve regurgitation. Though poorly assessed due to image quality, there is likely at least mild mitral stenosis (MG 3 mmHg HR 74 bpm). Severe bulky mitral annular calcification and leaflet thickening with restricted motion.  4. The aortic valve is abnormal. There  is moderate calcification of the aortic valve. There is mild thickening of the aortic valve. Aortic valve regurgitation is moderate. Aortic valve sclerosis/calcification is present, without any evidence of aortic stenosis.  5. The inferior vena cava is normal in size with greater than 50% respiratory variability, suggesting right atrial pressure of 3 mmHg.  6. Imaging findings suggesting dilated abdominal aorta, possibly up to 4 cm. Consider dedicated abdominal aorta imaging if clinicaly indicated. FINDINGS  Left Ventricle: Left ventricular ejection fraction, by estimation, is 60 to 65%. The left ventricle has normal function. Left ventricular endocardial border not optimally defined to evaluate regional wall motion. The left ventricular internal cavity size was normal in size. There is mild left ventricular hypertrophy. Abnormal (paradoxical) septal motion, consistent with left bundle branch block. Left ventricular diastolic parameters are indeterminate. Right Ventricle: The right ventricular size is normal. Right vetricular wall thickness was not well visualized. Right ventricular systolic function is normal. Tricuspid regurgitation signal is inadequate for assessing PA pressure. Left Atrium: Left atrial size was normal in size. Right Atrium: Right atrial size was normal in size. Pericardium: There is no evidence of pericardial effusion. Mitral Valve: The mitral valve is degenerative in appearance. Severe mitral annular calcification. Trivial mitral valve regurgitation. Mild mitral valve stenosis. MV peak gradient, 6.7 mmHg. The mean mitral valve gradient  is 3.0 mmHg. Tricuspid Valve: The tricuspid valve is normal in structure. Tricuspid valve regurgitation is trivial. No evidence of tricuspid stenosis. Aortic Valve: The aortic valve is abnormal. There is moderate calcification of the aortic valve. There is mild thickening of the aortic valve. Aortic valve regurgitation is moderate. Aortic valve sclerosis/calcification is present, without any evidence of aortic stenosis. Aortic valve mean gradient measures 2.0 mmHg. Aortic valve peak gradient measures 3.9 mmHg. Aortic valve area, by VTI measures 2.33 cm. Pulmonic Valve: The pulmonic valve was normal in structure. Pulmonic valve regurgitation is not visualized. No evidence of pulmonic stenosis. Aorta: The aortic root is normal in size and structure. Venous: The inferior vena cava is normal in size with greater than 50% respiratory variability, suggesting right atrial pressure of 3 mmHg. IAS/Shunts: No atrial level shunt detected by color flow Doppler.  LEFT VENTRICLE PLAX 2D LVIDd:         4.15 cm LVIDs:         2.91 cm LV PW:         1.14 cm LV IVS:        0.96 cm LVOT diam:     1.77 cm LV SV:         40 LV SV Index:   23 LVOT Area:     2.46 cm  IVC IVC diam: 1.77 cm LEFT ATRIUM         Index LA diam:    2.43 cm 1.39 cm/m  AORTIC VALVE AV Area (Vmax):    1.97 cm AV Area (Vmean):   2.03 cm AV Area (VTI):     2.33 cm AV Vmax:           98.70 cm/s AV Vmean:          63.400 cm/s AV VTI:            0.173 m AV Peak Grad:      3.9 mmHg AV Mean Grad:      2.0 mmHg LVOT Vmax:         79.10 cm/s LVOT Vmean:        52.400 cm/s LVOT VTI:  0.164 m LVOT/AV VTI ratio: 0.95  AORTA Ao Root diam: 3.30 cm Ao Asc diam:  3.60 cm MITRAL VALVE MV Area (PHT): 2.03 cm     SHUNTS MV Area VTI:   1.33 cm     Systemic VTI:  0.16 m MV Peak grad:  6.7 mmHg     Systemic Diam: 1.77 cm MV Mean grad:  3.0 mmHg MV Vmax:       1.29 m/s MV Vmean:      74.6 cm/s MV Decel Time: 373 msec MV E velocity: 102.90 cm/s MV A velocity: 140.00 cm/s  MV E/A ratio:  0.74 Soyla Merck MD Electronically signed by Soyla Merck MD Signature Date/Time: 09/16/2024/4:35:10 PM    Final    VAS US  CAROTID Result Date: 09/12/2024 Carotid Arterial Duplex Study Patient Name:  Andrew Pacheco  Date of Exam:   09/12/2024 Medical Rec #: [id number]      Accession #:    [id number] Date of Birth: December 28, 1940       Patient Gender: M Patient Age:   2 years Exam Location:  [address] Procedure:      VAS US  CAROTID Referring Phys: ROLLO LOUDER --------------------------------------------------------------------------------  Indications:  Carotid artery disease and patient presents with complaints of               overall weakness, dizziness and light headiness since the end of               July 2025. He denies any other cerebrovascular symptoms. Risk Factors: Hypertension, no history of smoking. Performing Technologist: Nanetta Shad RVT  Examination Guidelines: A complete evaluation includes B-mode imaging, spectral Doppler, color Doppler, and power Doppler as needed of all accessible portions of each vessel. Bilateral testing is considered an integral part of a complete examination. Limited examinations for reoccurring indications may be performed as noted.  Right Carotid Findings: +----------+--------+--------+--------+--------------------------+----------+           PSV cm/sEDV cm/sStenosisPlaque Description        Comments   +----------+--------+--------+--------+--------------------------+----------+ CCA Prox  51      7                                                    +----------+--------+--------+--------+--------------------------+----------+ CCA Distal68      17      <50%    heterogenous                         +----------+--------+--------+--------+--------------------------+----------+ ICA Prox  52      14      1-39%   heterogenous                         +----------+--------+--------+--------+--------------------------+----------+  ICA Distal96      31                                        steep dive +----------+--------+--------+--------+--------------------------+----------+ ECA       73      4               heterogenous and irregular           +----------+--------+--------+--------+--------------------------+----------+ +----------+--------+-------+----------------+-------------------+  PSV cm/sEDV cmsDescribe        Arm Pressure (mmHG) +----------+--------+-------+----------------+-------------------+ [id number]      0      Multiphasic, TWO852                 +----------+--------+-------+----------------+-------------------+ +---------+--------+--+--------+--+---------+ VertebralPSV cm/s42EDV cm/s12Antegrade +---------+--------+--+--------+--+---------+  Left Carotid Findings: +----------+--------+--------+--------+-----------------+----------------------+           PSV cm/sEDV cm/sStenosisPlaque           Comments                                                 Description                             +----------+--------+--------+--------+-----------------+----------------------+ CCA Prox  54      11                                                      +----------+--------+--------+--------+-----------------+----------------------+ CCA Distal48      14      <50%    heterogenous                            +----------+--------+--------+--------+-----------------+----------------------+ ICA Prox  53      15      1-39%   heterogenous                            +----------+--------+--------+--------+-----------------+----------------------+ ICA Distal149     37                               tortuous and turbulent +----------+--------+--------+--------+-----------------+----------------------+ ECA       68      7               heterogenous                            +----------+--------+--------+--------+-----------------+----------------------+  +----------+--------+--------+----------------+-------------------+           PSV cm/sEDV cm/sDescribe        Arm Pressure (mmHG) +----------+--------+--------+----------------+-------------------+ Dlarojcpjw35      0       Multiphasic, TWO847                 +----------+--------+--------+----------------+-------------------+ +---------+--------+--+--------+--+---------+ VertebralPSV cm/s42EDV cm/s14Antegrade +---------+--------+--+--------+--+---------+   Summary: Right Carotid: Velocities in the right ICA are consistent with a 1-39% stenosis. Left Carotid: Velocities in the left ICA are consistent with a 1-39% stenosis. Vertebrals:  Bilateral vertebral arteries demonstrate antegrade flow. Subclavians: Normal flow hemodynamics were seen in bilateral subclavian              arteries. *See table(s) above for measurements and observations.  Electronically signed by Dorn Lesches MD on 09/12/2024 at 4:49:34 PM.    Final     Labs:  CBC: Recent Labs    08/29/24 1217 08/29/24 1218 09/13/24 1527  WBC 7.5  --  6.2  HGB 9.6*  --  9.8*  HCT 28.3* 29.5* 29.5*  PLT 190  --  180    COAGS: No results for input(s): INR, APTT in the last 8760 hours.  BMP: Recent Labs    08/29/24 1217  NA 137  K 4.8  CL 103  CO2 26  GLUCOSE 82  BUN 37*  CALCIUM  9.5  CREATININE 2.04*  GFRNONAA 31*    LIVER FUNCTION TESTS: Recent Labs    08/29/24 1217  BILITOT 0.4  AST 14*  ALT 8  ALKPHOS 71  PROT 8.2*  ALBUMIN 4.8    TUMOR MARKERS: No results for input(s): AFPTM, CEA, CA199, CHROMGRNA in the last 8760 hours.  Assessment and Plan: 83 y.o. male with past medical history significant for arthritis, chronic back pain, colon polyps, diverticulosis, compression fractures, GERD, glaucoma, hearing difficulty, hypertension, chronic kidney disease, anemia and IgA monoclonal gammopathy of undetermined significance.  He is scheduled today for image guided bone marrow biopsy for  further evaluation/rule out myeloma.Risks and benefits of procedure was discussed with the patient including, but not limited to bleeding, infection, damage to adjacent structures or low yield requiring additional tests.  All of the questions were answered and there is agreement to proceed.  Consent signed and in chart.    Thank you for allowing our service to participate in Andrew Pacheco 's care.  Electronically Signed: D. Franky Rakers, PA-C   10/11/2024, 4:22 PM      I spent a total of  20 minutes   in face to face in clinical consultation, greater than 50% of which was counseling/coordinating care for image guided bone marrow biopsy

## 2024-10-12 ENCOUNTER — Encounter (HOSPITAL_COMMUNITY): Payer: Self-pay

## 2024-10-12 ENCOUNTER — Other Ambulatory Visit: Payer: Self-pay

## 2024-10-12 ENCOUNTER — Ambulatory Visit (HOSPITAL_COMMUNITY)
Admission: RE | Admit: 2024-10-12 | Discharge: 2024-10-12 | Disposition: A | Discharge status: Home / Self Care | Source: Ambulatory Visit | Attending: Hematology and Oncology | Admitting: Hematology and Oncology

## 2024-10-12 DIAGNOSIS — D472 Monoclonal gammopathy: Secondary | ICD-10-CM | POA: Diagnosis present

## 2024-10-12 DIAGNOSIS — I1310 Hypertensive heart and chronic kidney disease without heart failure, with stage 1 through stage 4 chronic kidney disease, or unspecified chronic kidney disease: Secondary | ICD-10-CM | POA: Insufficient documentation

## 2024-10-12 DIAGNOSIS — Z860100 Personal history of colon polyps, unspecified: Secondary | ICD-10-CM | POA: Diagnosis not present

## 2024-10-12 DIAGNOSIS — M5459 Other low back pain: Secondary | ICD-10-CM | POA: Insufficient documentation

## 2024-10-12 DIAGNOSIS — G8929 Other chronic pain: Secondary | ICD-10-CM | POA: Insufficient documentation

## 2024-10-12 DIAGNOSIS — K219 Gastro-esophageal reflux disease without esophagitis: Secondary | ICD-10-CM | POA: Insufficient documentation

## 2024-10-12 DIAGNOSIS — R634 Abnormal weight loss: Secondary | ICD-10-CM | POA: Diagnosis not present

## 2024-10-12 DIAGNOSIS — N189 Chronic kidney disease, unspecified: Secondary | ICD-10-CM | POA: Insufficient documentation

## 2024-10-12 DIAGNOSIS — D72822 Plasmacytosis: Secondary | ICD-10-CM | POA: Insufficient documentation

## 2024-10-12 DIAGNOSIS — H9190 Unspecified hearing loss, unspecified ear: Secondary | ICD-10-CM | POA: Diagnosis not present

## 2024-10-12 DIAGNOSIS — M160 Bilateral primary osteoarthritis of hip: Secondary | ICD-10-CM | POA: Insufficient documentation

## 2024-10-12 DIAGNOSIS — D72821 Monocytosis (symptomatic): Secondary | ICD-10-CM | POA: Insufficient documentation

## 2024-10-12 DIAGNOSIS — K5790 Diverticulosis of intestine, part unspecified, without perforation or abscess without bleeding: Secondary | ICD-10-CM | POA: Insufficient documentation

## 2024-10-12 DIAGNOSIS — H409 Unspecified glaucoma: Secondary | ICD-10-CM | POA: Insufficient documentation

## 2024-10-12 DIAGNOSIS — Z1379 Encounter for other screening for genetic and chromosomal anomalies: Secondary | ICD-10-CM | POA: Diagnosis not present

## 2024-10-12 DIAGNOSIS — D631 Anemia in chronic kidney disease: Secondary | ICD-10-CM | POA: Insufficient documentation

## 2024-10-12 DIAGNOSIS — R011 Cardiac murmur, unspecified: Secondary | ICD-10-CM | POA: Diagnosis not present

## 2024-10-12 DIAGNOSIS — R54 Age-related physical debility: Secondary | ICD-10-CM | POA: Insufficient documentation

## 2024-10-12 DIAGNOSIS — M47816 Spondylosis without myelopathy or radiculopathy, lumbar region: Secondary | ICD-10-CM | POA: Diagnosis not present

## 2024-10-12 LAB — CBC WITH DIFFERENTIAL/PLATELET
Basophils Absolute: 0 K/uL (ref 0.0–0.1)
Basophils Relative: 0 %
Eosinophils Absolute: 0.1 K/uL (ref 0.0–0.5)
Eosinophils Relative: 2 %
HCT: 29.7 % — ABNORMAL LOW (ref 39.0–52.0)
Hemoglobin: 9.1 g/dL — ABNORMAL LOW (ref 13.0–17.0)
Lymphocytes Relative: 43 %
Lymphs Abs: 2.8 K/uL (ref 0.7–4.0)
MCH: 29.6 pg (ref 26.0–34.0)
MCHC: 30.6 g/dL (ref 30.0–36.0)
MCV: 96.7 fL (ref 80.0–100.0)
Monocytes Absolute: 1.8 K/uL — ABNORMAL HIGH (ref 0.1–1.0)
Monocytes Relative: 27 %
Neutro Abs: 1.8 K/uL (ref 1.7–7.7)
Neutrophils Relative %: 28 %
Platelets: 192 K/uL (ref 150–400)
RBC: 3.07 MIL/uL — ABNORMAL LOW (ref 4.22–5.81)
RDW: 13.6 % (ref 11.5–15.5)
Smear Review: NORMAL
WBC: 6.5 K/uL (ref 4.0–10.5)
nRBC: 0 % (ref 0.0–0.2)

## 2024-10-12 LAB — GLUCOSE, CAPILLARY: Glucose-Capillary: 82 mg/dL (ref 70–99)

## 2024-10-12 MED ORDER — NALOXONE HCL 0.4 MG/ML IJ SOLN
INTRAMUSCULAR | Status: AC
  Filled 2024-10-12: qty 1

## 2024-10-12 MED ORDER — FLUMAZENIL 0.5 MG/5ML IV SOLN
INTRAVENOUS | Status: AC
  Filled 2024-10-12: qty 5

## 2024-10-12 MED ORDER — FENTANYL CITRATE (PF) 100 MCG/2ML IJ SOLN
INTRAMUSCULAR | Status: AC
  Filled 2024-10-12: qty 2

## 2024-10-12 MED ORDER — MIDAZOLAM HCL (PF) 2 MG/2ML IJ SOLN
INTRAMUSCULAR | Status: AC | PRN
  Administered 2024-10-12 (×2): .5 mg via INTRAVENOUS

## 2024-10-12 MED ORDER — ONDANSETRON HCL 4 MG/2ML IJ SOLN: 4.0000 mg | Freq: Once | INTRAMUSCULAR | Status: DC

## 2024-10-12 MED ORDER — FENTANYL CITRATE (PF) 100 MCG/2ML IJ SOLN
INTRAMUSCULAR | Status: AC | PRN
  Administered 2024-10-12 (×2): 25 ug via INTRAVENOUS

## 2024-10-12 MED ORDER — SODIUM CHLORIDE 0.9 % IV SOLN: INTRAVENOUS | Status: DC

## 2024-10-12 MED ORDER — MIDAZOLAM HCL 2 MG/2ML IJ SOLN
INTRAMUSCULAR | Status: AC
  Filled 2024-10-12: qty 2

## 2024-10-12 NOTE — Progress Notes (Signed)
 Short Stay Nursing Note: Up to restroom to void preprocedure, became very weak, stated  I felt like I wanted to fall in the floor, pt became very pale, complained of being nauseated. Skin warm and dry. Placed in wc to return back to room, noted that RLE was very weak, was unable to lift rt leg or move rt leg. Placed back in exam room, placed on cardiac monitor NSR noted on monitor, ECG performed CBG was 80's Nauseated - per PA orders 4mg  Zofran  administered, nausea quickly resoloved BS clear , resp even and non-labored BEFAST performed: Balance - felt dizzy and weak, eyes, stated had some blurred vision, Face=WNL, Ams= WNL, Speech=WNL, Time 0822hrs of onset of S/S Current VS HR 60/min, NSR RR 14/min, even and non labored POX 100% 2lpm via [place] NBP 128/66 (83) via cuff left arm Capillary refill slightly delayed, extremities cool and dry S1S2 noted, no JVD noted Dr Luverne, MD at bedside

## 2024-10-12 NOTE — Procedures (Signed)
 Interventional Radiology Procedure Note  Procedure: CT guided bone marrow aspiration and biopsy  Complications: None  EBL: < 10 mL  Findings: Aspirate and core biopsy performed of bone marrow in right iliac bone.  Plan: Bedrest supine x 1 hrs  Ericha Whittingham T. Fredia Sorrow, M.D Pager:  [phone]

## 2024-10-17 LAB — SURGICAL PATHOLOGY

## 2024-10-18 ENCOUNTER — Encounter (HOSPITAL_COMMUNITY)
Admission: RE | Admit: 2024-10-18 | Discharge: 2024-10-18 | Disposition: A | Discharge status: Home / Self Care | Source: Ambulatory Visit | Attending: Hematology and Oncology | Admitting: Hematology and Oncology

## 2024-10-18 DIAGNOSIS — D472 Monoclonal gammopathy: Secondary | ICD-10-CM | POA: Diagnosis present

## 2024-10-18 LAB — GLUCOSE, CAPILLARY: Glucose-Capillary: 99 mg/dL (ref 70–99)

## 2024-10-18 MED ORDER — FLUDEOXYGLUCOSE F - 18 (FDG) INJECTION
7.5000 | Freq: Once | INTRAVENOUS | Status: AC
  Administered 2024-10-18: 6.42 via INTRAVENOUS

## 2024-10-19 LAB — SURGICAL PATHOLOGY

## 2024-10-20 ENCOUNTER — Ambulatory Visit: Payer: Self-pay | Admitting: Hematology and Oncology

## 2024-10-23 ENCOUNTER — Encounter (HOSPITAL_COMMUNITY): Payer: Self-pay

## 2024-10-25 ENCOUNTER — Inpatient Hospital Stay: Attending: Hematology and Oncology | Admitting: Hematology and Oncology

## 2024-10-25 DIAGNOSIS — M47814 Spondylosis without myelopathy or radiculopathy, thoracic region: Secondary | ICD-10-CM | POA: Insufficient documentation

## 2024-10-25 DIAGNOSIS — K8020 Calculus of gallbladder without cholecystitis without obstruction: Secondary | ICD-10-CM | POA: Insufficient documentation

## 2024-10-25 DIAGNOSIS — M4317 Spondylolisthesis, lumbosacral region: Secondary | ICD-10-CM | POA: Insufficient documentation

## 2024-10-25 DIAGNOSIS — R5383 Other fatigue: Secondary | ICD-10-CM | POA: Insufficient documentation

## 2024-10-25 DIAGNOSIS — C9310 Chronic myelomonocytic leukemia not having achieved remission: Secondary | ICD-10-CM | POA: Diagnosis not present

## 2024-10-25 DIAGNOSIS — I7090 Unspecified atherosclerosis: Secondary | ICD-10-CM | POA: Insufficient documentation

## 2024-10-25 DIAGNOSIS — I10 Essential (primary) hypertension: Secondary | ICD-10-CM | POA: Insufficient documentation

## 2024-10-25 DIAGNOSIS — R7989 Other specified abnormal findings of blood chemistry: Secondary | ICD-10-CM | POA: Insufficient documentation

## 2024-10-25 DIAGNOSIS — Z79899 Other long term (current) drug therapy: Secondary | ICD-10-CM | POA: Insufficient documentation

## 2024-10-25 DIAGNOSIS — K5730 Diverticulosis of large intestine without perforation or abscess without bleeding: Secondary | ICD-10-CM | POA: Insufficient documentation

## 2024-10-25 DIAGNOSIS — M549 Dorsalgia, unspecified: Secondary | ICD-10-CM | POA: Insufficient documentation

## 2024-10-25 DIAGNOSIS — D472 Monoclonal gammopathy: Secondary | ICD-10-CM | POA: Insufficient documentation

## 2024-10-25 DIAGNOSIS — D649 Anemia, unspecified: Secondary | ICD-10-CM | POA: Insufficient documentation

## 2024-10-25 DIAGNOSIS — R634 Abnormal weight loss: Secondary | ICD-10-CM | POA: Insufficient documentation

## 2024-10-25 DIAGNOSIS — M47817 Spondylosis without myelopathy or radiculopathy, lumbosacral region: Secondary | ICD-10-CM | POA: Insufficient documentation

## 2024-10-25 DIAGNOSIS — G4700 Insomnia, unspecified: Secondary | ICD-10-CM | POA: Insufficient documentation

## 2024-10-25 DIAGNOSIS — I4891 Unspecified atrial fibrillation: Secondary | ICD-10-CM | POA: Insufficient documentation

## 2024-10-25 DIAGNOSIS — M47812 Spondylosis without myelopathy or radiculopathy, cervical region: Secondary | ICD-10-CM | POA: Insufficient documentation

## 2024-10-25 NOTE — Progress Notes (Unsigned)
 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 Protein level at 0.7 grams, no significant bone lesions, small abnormal M protein in urine. 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  Cannot calculate prognostic score yet, awaiting some additional results Briefly reviewed treatment prognosis and supportive measures I suggested he see Dr Onesimo for further management of CMML, I did send an inbasket message to Dr Onesimo as well. PET scan with no def evidence of malignancy.  HISTORY OF PRESENTING ILLNESS:  Andrew Pacheco 83 y.o. male is here because of anemia and weight loss  Discussed the use of AI scribe software for clinical note transcription with the patient, who gave verbal consent to proceed.  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  All other systems were reviewed with the patient and are negative.  MEDICAL HISTORY:  Past Medical History:  Diagnosis Date   Anemia    Arthritis    both hips, back (09/09/2017)   Chronic back pain     middle and lower back (09/09/2017)   Colon polyps    Daily headache    Diverticulosis    GERD (gastroesophageal reflux disease)    Glaucoma, both eyes    H pylori ulcer    Hard of hearing    HTN (hypertension)    Insomnia    due to disc   Lumbar disc disease    Migraine    q 3-4 months (09/09/2017)   Seasonal allergies     SURGICAL HISTORY: Past Surgical History:  Procedure Laterality Date   APPENDECTOMY     CYST EXCISION Right ~ 1971   knee   ESOPHAGOGASTRODUODENOSCOPY N/A 01/15/2018   Procedure: ESOPHAGOGASTRODUODENOSCOPY (EGD);  Surgeon: Aneita Gwendlyn DASEN, MD;  Location: [facility] ENDOSCOPY;  Service: Endoscopy;  Laterality: N/A;   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   Procedure: TOTAL HIP ARTHROPLASTY ANTERIOR APPROACH;  Surgeon: Fidel Rogue, MD;  Location: [facility] ORS;  Service: Orthopedics;  Laterality: Right;    SOCIAL HISTORY: Social History   Socioeconomic History   Marital status: Married    Spouse name: Not on file   Number of children: 2   Years of education: Not on file   Highest education level: Not on file  Occupational History   Occupation: security guard - [employer]  Tobacco Use   Smoking status: Never   Smokeless tobacco: Never  Vaping Use   Vaping status: Never Used  Substance and Sexual Activity   Alcohol  use: Yes    Comment: 09/09/2017 couple glasses of wine q 6 months   Drug use: No   Sexual activity:  Not on file  Other Topics Concern   Not on file  Social History Narrative   Not on file   Social Drivers of Health   Financial Resource Strain: Not on file  Food Insecurity: Not on file  Transportation Needs: Not on file  Physical Activity: Not on file  Stress: Not on file  Social Connections: Not on file  Intimate Partner Violence: Not on file    FAMILY HISTORY: Family History  Problem Relation Age of Onset   Heart disease Father    Emphysema Father    Diverticulosis Father    Cancer Mother        gallbladder-mets    Arthritis Mother    Liver cancer Mother     ALLERGIES:  has no known allergies.  MEDICATIONS:  Current Outpatient Medications  Medication Sig Dispense Refill   COMBIGAN  0.2-0.5 % ophthalmic solution Place 1 drop into both eyes 2 (two) times daily.  2   cyclobenzaprine (FLEXERIL) 10 MG tablet Take 10 mg by mouth as needed for muscle spasms.     diazepam  (VALIUM ) 5 MG tablet Take one tablet by mouth with food one hour prior to procedure. May repeat 30 minutes prior if needed. 2 tablet 0   feeding supplement, ENSURE ENLIVE, (ENSURE ENLIVE) LIQD Take 237 mLs by mouth 2 (two) times daily between meals. 237 mL 0   HYDROcodone -acetaminophen  (NORCO) 7.5-325 MG tablet Take 1-2 tablets by mouth every 4 (four) hours as needed for severe pain (pain score 7-10). 30 tablet 0   ipratropium (ATROVENT ) 0.06 % nasal spray Place 2 sprays into both nostrils as needed for rhinitis.     pantoprazole  (PROTONIX ) 40 MG tablet Take 1 tablet (40 mg total) by mouth daily. Office visit for further refills 90 tablet 0   polyethylene glycol (MIRALAX  / GLYCOLAX ) 17 g packet Take 17 g by mouth daily. 14 each 0   Prenatal Vit-Fe Fumarate-FA (PRENATAL VITAMINS) 28-0.8 MG TABS Take 1 tablet by mouth daily.  12   senna-docusate (SENOKOT-S) 8.6-50 MG tablet Take 2 tablets by mouth 2 (two) times daily.     travoprost , benzalkonium, (TRAVATAN ) 0.004 % ophthalmic solution Place 1 drop into both eyes at bedtime.     zolpidem  (AMBIEN ) 10 MG tablet Take 1 tablet (10 mg total) by mouth at bedtime as needed for sleep. 15 tablet 0   No current facility-administered medications for this visit.     PHYSICAL EXAMINATION: ECOG PERFORMANCE STATUS: 2 - Symptomatic, <50% confined to bed  There were no vitals filed for this visit.   There were no vitals filed for this visit.    Telephone visit  LABORATORY DATA:  I have reviewed the data as listed Lab Results  Component Value Date   WBC 6.5 10/12/2024   HGB 9.1 (L) 10/12/2024    HCT 29.7 (L) 10/12/2024   MCV 96.7 10/12/2024   PLT 192 10/12/2024     Chemistry      Component Value Date/Time   NA 137 08/29/2024 1217   K 4.8 08/29/2024 1217   CL 103 08/29/2024 1217   CO2 26 08/29/2024 1217   BUN 37 (H) 08/29/2024 1217   CREATININE 2.04 (H) 08/29/2024 1217      Component Value Date/Time   CALCIUM  9.5 08/29/2024 1217   ALKPHOS 71 08/29/2024 1217   AST 14 (L) 08/29/2024 1217   ALT 8 08/29/2024 1217   BILITOT 0.4 08/29/2024 1217       RADIOGRAPHIC STUDIES: I have personally reviewed the  radiological images as listed and agreed with the findings in the report. NM PET Image Initial (PI) Skull Base To Thigh Result Date: 10/19/2024 EXAM: PET AND CT SKULL BASE TO MID THIGH 10/18/2024 03:16:19 PM TECHNIQUE: RADIOPHARMACEUTICAL: 6.42 mCi F-18 FDG Uptake time 60 minutes. Glucose level 99 mg/dl. Blood pool SUV 2.0, liver parenchyma SUV 2.9. PET imaging was acquired from the base of the skull to the mid thighs. Non-contrast enhanced computed tomography was obtained for attenuation correction and anatomic localization. COMPARISON: None available. CLINICAL HISTORY: Unexplained weight loss, looking for occult malignancy. Monoclonal gammopathy of unknown significance. FINDINGS: HEAD AND NECK: No metabolically active cervical lymphadenopathy. Bilateral common carotid atheromatous vascular calcifications. CHEST: No metabolically active pulmonary nodules. Pro right hilar lymph nodes are observed with maximum SUV 3.8. 1.0 cm lower paratracheal node anterior to the carina on image 80 series 4 with maximum SUV 3.0. Mild anterior scarring along the minor fissure. Mild subpleural reticulation in the lungs, nonspecific. Chest atheroma. ABDOMEN AND PELVIS: No metabolically active intraperitoneal mass. No metabolically active lymphadenopathy. Physiologic activity within the gastrointestinal and genitourinary systems. Small gallstones noted. Systemic atherosclerosis is present, including the  aorta and iliac arteries. A predominant abdominal aortic caliber. Sigmoid colon diverticulosis. BONES AND SOFT TISSUE: Excess portal and accentuated metabolic activity in the skeleton. No focal skeletal lesion identified. No metabolically active aggressive osseous lesion. Right total hip prosthesis. Mild grade 1 anterolisthesis at L5-S1. IMPRESSION: 1. Small right hilar and lower paratracheal lymph nodes with moderate metabolic activity, potentially reactive or neoplastic. 2. Diffuse increased skeletal metabolic activity without focal lesion, suggestive of a systemic process. 3. Systemic atherosclerosis. 4. Mild subpleural reticulation and mild anterior scarring along the minor fissure. 5. Sigmoid diverticulosis. 6. Mild grade 1 anterolisthesis at L5-S1. 7. Cholelithiasis Electronically signed by: Ryan Salvage MD 10/19/2024 01:01 PM EDT RP Workstation: [id number]   CT BONE MARROW BIOPSY & ASPIRATION Result Date: 10/12/2024 CLINICAL DATA:  IgA monoclonal gammopathy and need for bone marrow biopsy. EXAM: CT GUIDED BONE MARROW ASPIRATION AND BIOPSY ANESTHESIA/SEDATION: Moderate (conscious) sedation was employed during this procedure. A total of Versed  1.0 mg and Fentanyl  50 mcg was administered intravenously. Moderate Sedation Time: 17 minutes. The patient's level of consciousness and vital signs were monitored continuously by radiology nursing throughout the procedure under my direct supervision. PROCEDURE: The procedure risks, benefits, and alternatives were explained to the patient. Questions regarding the procedure were encouraged and answered. The patient understands and consents to the procedure. A time out was performed prior to initiating the procedure. The right gluteal region was prepped with chlorhexidine . Sterile gown and sterile gloves were used for the procedure. Local anesthesia was provided with 1% Lidocaine . Under CT guidance, an 11 gauge On Control bone cutting needle was advanced from a  posterior approach into the right iliac bone. Needle positioning was confirmed with CT. Initial non heparinized and heparinized aspirate samples were obtained of bone marrow. Core biopsy was performed via the On Control drill needle. COMPLICATIONS: None FINDINGS: Inspection of initial aspirate did reveal visible particles. Intact core biopsy sample was obtained. IMPRESSION: CT guided bone marrow biopsy of right posterior iliac bone with both aspirate and core samples obtained. Electronically Signed   By: Marcey Moan M.D.   On: 10/12/2024 13:40   VAS US  AAA DUPLEX Result Date: 10/01/2024 ABDOMINAL AORTA STUDY Patient Name:  Andrew MESSER  Date of Exam:   10/01/2024 Medical Rec #: [id number]      Accession #:    [id number] Date of Birth:  12/01/1941       Patient Gender: M Patient Age:   83 years Exam Location:  [address] Procedure:      VAS US  AAA DUPLEX Referring Phys: ROLLO LOUDER --------------------------------------------------------------------------------  Indications: Follow up exam for known AAA. Risk Factors: Hypertension, no history of smoking. Other Factors: Patient mentioned weight loss of approx 80 lbs. Endorses fear of                eating for approx 1 year.SABRA Spiro most of weight loss in the                past year.  Comparison Study: Echo 09/12/24: 6. Imaging findings suggesting dilated abdominal                   aorta, possibly up to 4 cm. Consider dedicated abdominal aorta                   imaging if clinicaly indicated. Performing Technologist: King Pierre RVT  Examination Guidelines: A complete evaluation includes B-mode imaging, spectral Doppler, color Doppler, and power Doppler as needed of all accessible portions of each vessel. Bilateral testing is considered an integral part of a complete examination. Limited examinations for reoccurring indications may be performed as noted.  Abdominal Aorta Findings:  +-------------+-------+----------+----------+---------+--------+--------+ Location     AP (cm)Trans (cm)PSV (cm/s)Waveform ThrombusComments +-------------+-------+----------+----------+---------+--------+--------+ Proximal     3.40   3.10      56                                  +-------------+-------+----------+----------+---------+--------+--------+ Mid          3.16   3.32      40                                  +-------------+-------+----------+----------+---------+--------+--------+ Distal       2.49   2.64                                          +-------------+-------+----------+----------+---------+--------+--------+ RT CIA Prox  1.4    1.3       63        triphasic                 +-------------+-------+----------+----------+---------+--------+--------+ RT CIA Distal                 71        biphasic                  +-------------+-------+----------+----------+---------+--------+--------+ RT EIA Prox                   69        biphasic                  +-------------+-------+----------+----------+---------+--------+--------+ RT EIA Distal                 71        biphasic                  +-------------+-------+----------+----------+---------+--------+--------+ LT CIA Prox  1.1    1.1       64        triphasic                 +-------------+-------+----------+----------+---------+--------+--------+  LT CIA Distal                 99        biphasic                  +-------------+-------+----------+----------+---------+--------+--------+ LT EIA Prox                   66        biphasic                  +-------------+-------+----------+----------+---------+--------+--------+ LT EIA Distal                 66        biphasic                  +-------------+-------+----------+----------+---------+--------+--------+ Celiac 245/55 cm/s Proximal SMA 398/1 cm/s Mid SMA 131/0 cm/s and turbulent Distal SMA 81/0 cm/s  IMA 240/20 cm/s.  Summary: Abdominal Aorta: Dilatation of the abdominal aorta throughout. 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 Mid and distal SMA demonstrate waveforms suggesting proximal obstruction.  *See table(s) above for measurements and observations.  Electronically signed by Timothy Gollan MD on 10/01/2024 at 9:00:01 PM.    Final     All questions were answered. The patient knows to call the clinic with any problems, questions or concerns. I spent 20 minutes in the care of this patient including H and P, review of records, counseling and coordination of care.  I connected with  ALEXANDAR WEISENBERGER on 10/26/24 by a telephone application and verified that I am speaking with the correct person using two identifiers.   I discussed the limitations of evaluation and management by telemedicine. The patient expressed understanding and agreed to proceed.  Location of pt: Home Location of provider: office.     Amber Stalls, MD 10/25/2024 11:17 AM

## 2024-11-01 ENCOUNTER — Other Ambulatory Visit: Payer: Self-pay

## 2024-11-01 ENCOUNTER — Other Ambulatory Visit: Payer: Self-pay | Admitting: *Deleted

## 2024-11-01 ENCOUNTER — Inpatient Hospital Stay

## 2024-11-01 ENCOUNTER — Inpatient Hospital Stay: Admitting: Hematology

## 2024-11-01 VITALS — BP 141/75 | HR 81 | Temp 98.1°F | Resp 17 | Ht 71.0 in | Wt 134.4 lb

## 2024-11-01 DIAGNOSIS — D472 Monoclonal gammopathy: Secondary | ICD-10-CM

## 2024-11-01 DIAGNOSIS — N1832 Chronic kidney disease, stage 3b: Secondary | ICD-10-CM | POA: Diagnosis not present

## 2024-11-01 DIAGNOSIS — M47817 Spondylosis without myelopathy or radiculopathy, lumbosacral region: Secondary | ICD-10-CM | POA: Diagnosis not present

## 2024-11-01 DIAGNOSIS — M47812 Spondylosis without myelopathy or radiculopathy, cervical region: Secondary | ICD-10-CM | POA: Diagnosis not present

## 2024-11-01 DIAGNOSIS — D649 Anemia, unspecified: Secondary | ICD-10-CM | POA: Diagnosis not present

## 2024-11-01 DIAGNOSIS — R5383 Other fatigue: Secondary | ICD-10-CM | POA: Diagnosis not present

## 2024-11-01 DIAGNOSIS — C9310 Chronic myelomonocytic leukemia not having achieved remission: Secondary | ICD-10-CM | POA: Diagnosis not present

## 2024-11-01 DIAGNOSIS — G4700 Insomnia, unspecified: Secondary | ICD-10-CM | POA: Diagnosis not present

## 2024-11-01 DIAGNOSIS — M47814 Spondylosis without myelopathy or radiculopathy, thoracic region: Secondary | ICD-10-CM | POA: Diagnosis not present

## 2024-11-01 DIAGNOSIS — M549 Dorsalgia, unspecified: Secondary | ICD-10-CM | POA: Diagnosis not present

## 2024-11-01 DIAGNOSIS — K8020 Calculus of gallbladder without cholecystitis without obstruction: Secondary | ICD-10-CM | POA: Diagnosis not present

## 2024-11-01 DIAGNOSIS — I7090 Unspecified atherosclerosis: Secondary | ICD-10-CM | POA: Diagnosis not present

## 2024-11-01 DIAGNOSIS — R7989 Other specified abnormal findings of blood chemistry: Secondary | ICD-10-CM | POA: Diagnosis not present

## 2024-11-01 DIAGNOSIS — Z79899 Other long term (current) drug therapy: Secondary | ICD-10-CM | POA: Diagnosis not present

## 2024-11-01 DIAGNOSIS — I4891 Unspecified atrial fibrillation: Secondary | ICD-10-CM | POA: Diagnosis not present

## 2024-11-01 DIAGNOSIS — I10 Essential (primary) hypertension: Secondary | ICD-10-CM | POA: Diagnosis not present

## 2024-11-01 DIAGNOSIS — K5730 Diverticulosis of large intestine without perforation or abscess without bleeding: Secondary | ICD-10-CM | POA: Diagnosis not present

## 2024-11-01 DIAGNOSIS — R634 Abnormal weight loss: Secondary | ICD-10-CM | POA: Diagnosis not present

## 2024-11-01 DIAGNOSIS — M4317 Spondylolisthesis, lumbosacral region: Secondary | ICD-10-CM | POA: Diagnosis not present

## 2024-11-01 LAB — CBC WITH DIFFERENTIAL (CANCER CENTER ONLY)
Abs Immature Granulocytes: 0.07 K/uL (ref 0.00–0.07)
Basophils Absolute: 0 K/uL (ref 0.0–0.1)
Basophils Relative: 0 %
Eosinophils Absolute: 0 K/uL (ref 0.0–0.5)
Eosinophils Relative: 0 %
HCT: 28.2 % — ABNORMAL LOW (ref 39.0–52.0)
Hemoglobin: 9.3 g/dL — ABNORMAL LOW (ref 13.0–17.0)
Immature Granulocytes: 1 %
Lymphocytes Relative: 25 %
Lymphs Abs: 1.2 K/uL (ref 0.7–4.0)
MCH: 30.7 pg (ref 26.0–34.0)
MCHC: 33 g/dL (ref 30.0–36.0)
MCV: 93.1 fL (ref 80.0–100.0)
Monocytes Absolute: 2.4 K/uL — ABNORMAL HIGH (ref 0.1–1.0)
Monocytes Relative: 49 %
Neutro Abs: 1.2 K/uL — ABNORMAL LOW (ref 1.7–7.7)
Neutrophils Relative %: 25 %
Platelet Count: 188 K/uL (ref 150–400)
RBC: 3.03 MIL/uL — ABNORMAL LOW (ref 4.22–5.81)
RDW: 13.6 % (ref 11.5–15.5)
WBC Count: 4.9 K/uL (ref 4.0–10.5)
nRBC: 0 % (ref 0.0–0.2)

## 2024-11-01 LAB — VITAMIN D 25 HYDROXY (VIT D DEFICIENCY, FRACTURES): Vit D, 25-Hydroxy: 52.89 ng/mL (ref 30–100)

## 2024-11-01 LAB — T4, FREE: Free T4: 0.95 ng/dL (ref 0.61–1.12)

## 2024-11-01 LAB — SAMPLE TO BLOOD BANK

## 2024-11-01 LAB — LACTATE DEHYDROGENASE: LDH: 103 U/L — ABNORMAL LOW (ref 105–235)

## 2024-11-01 LAB — RETIC PANEL
Immature Retic Fract: 12.1 % (ref 2.3–15.9)
RBC.: 3.1 MIL/uL — ABNORMAL LOW (ref 4.22–5.81)
Retic Count, Absolute: 24.5 K/uL (ref 19.0–186.0)
Retic Ct Pct: 0.8 % (ref 0.4–3.1)
Reticulocyte Hemoglobin: 30.3 pg (ref >27.9)

## 2024-11-01 NOTE — Progress Notes (Signed)
 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 male who has been referred to us  by Dr. 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 accompanied by wife.   He says that his PCP, Dr. Yolande has been monitoring his anemia for two years, and he has been instructed to take a prenatal vitamin, but after no improvement and worsening symptoms referred to oncology for evaluation.  He says that three years ago, he tripped and crushed his right hip and broke his femur. Subsequently underwent a full hip replacement. At that time weighed 180 LBS, currently 130 LBS for a total of 20 LBS over two years. Gradually his appetite and energy levels diminished. Endorses occasionally extreme fatigue or persisting mild fatigue.  His weight loss has improved within the past couple of months. Does occasionally use Ensure for nutritional supplementation.  He also has a history of bleeding ulcers and there was an incident several years ago where he was admitted to the hospital for severe anemia. From this incident, his wife notes that he's never returned to baseline.   Also was recently diagnosed with Afib, but has not been placed on medication due to concern of syncope. During Afib attacks, he is extremely weak and fatigued.  Wife notes that he has never laid down during the day, but more recently over the years has been occurring more frequently.   He denies a new cough, respiratory infections, SOB, other illnesses, bleeding issues,  abdominal distention, leg swelling, joint pain or swelling. Denies history of prostate issues.  He does note that he experiences abdominal fullness after eating. Also, he does reportedly experience back pain due to an injury. He was not recommended to have surgery for this, so is instead being managed with medication.  He says that his vision and hearing has been degrading.   MEDICAL HISTORY:  Past Medical History:  Diagnosis Date   Anemia    Arthritis    both hips, back (09/09/2017)   Chronic back pain    middle and lower back (09/09/2017)   Colon polyps    Daily headache    Diverticulosis    GERD (gastroesophageal reflux disease)    Glaucoma, both eyes    H pylori ulcer    Hard of hearing    HTN (hypertension)    Insomnia    due to disc   Lumbar disc disease    Migraine    q 3-4 months (09/09/2017)   Seasonal allergies     SURGICAL HISTORY: Past Surgical History:  Procedure Laterality Date   APPENDECTOMY     CYST EXCISION Right ~ 1971   knee   ESOPHAGOGASTRODUODENOSCOPY N/A 01/15/2018   Procedure: ESOPHAGOGASTRODUODENOSCOPY (EGD);  Surgeon: Aneita Gwendlyn DASEN, MD;  Location: [facility] ENDOSCOPY;  Service: Endoscopy;  Laterality: N/A;   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   Procedure: TOTAL HIP ARTHROPLASTY ANTERIOR APPROACH;  Surgeon: Fidel Rogue, MD;  Location: [facility] ORS;  Service: Orthopedics;  Laterality: Right;    SOCIAL HISTORY: Social History   Socioeconomic History   Marital status: Married    Spouse name: Not on file  Number of children: 2   Years of education: Not on file   Highest education level: Not on file  Occupational History   Occupation: security guard - [employer]  Tobacco Use   Smoking status: Never   Smokeless tobacco: Never  Vaping Use   Vaping status: Never Used  Substance and Sexual Activity   Alcohol  use: Yes    Comment: 09/09/2017 couple glasses of wine q 6 months   Drug use: No   Sexual activity: Not on file  Other  Topics Concern   Not on file  Social History Narrative   Not on file   Social Drivers of Health   Financial Resource Strain: Not on file  Food Insecurity: No Food Insecurity (11/01/2024)   Hunger Vital Sign    Worried About Running Out of Food in the Last Year: Never true    Ran Out of Food in the Last Year: Never true  Transportation Needs: No Transportation Needs (11/01/2024)   PRAPARE - Administrator, Civil Service (Medical): No    Lack of Transportation (Non-Medical): No  Physical Activity: Not on file  Stress: Not on file  Social Connections: Not on file  Intimate Partner Violence: Not At Risk (11/01/2024)   Humiliation, Afraid, Rape, and Kick questionnaire    Fear of Current or Ex-Partner: No    Emotionally Abused: No    Physically Abused: No    Sexually Abused: No   Social History   Social History Narrative   Not on file    FAMILY HISTORY: Family History  Problem Relation Age of Onset   Heart disease Father    Emphysema Father    Diverticulosis Father    Cancer Mother        gallbladder-mets   Arthritis Mother    Liver cancer Mother     ALLERGIES:  has no known allergies.  MEDICATIONS:  Current Outpatient Medications  Medication Sig Dispense Refill   COMBIGAN  0.2-0.5 % ophthalmic solution Place 1 drop into both eyes 2 (two) times daily.  2   cyclobenzaprine (FLEXERIL) 10 MG tablet Take 10 mg by mouth as needed for muscle spasms.     feeding supplement, ENSURE ENLIVE, (ENSURE ENLIVE) LIQD Take 237 mLs by mouth 2 (two) times daily between meals. 237 mL 0   HYDROcodone -acetaminophen  (NORCO) 7.5-325 MG tablet Take 1-2 tablets by mouth every 4 (four) hours as needed for severe pain (pain score 7-10). 30 tablet 0   ipratropium (ATROVENT ) 0.06 % nasal spray Place 2 sprays into both nostrils as needed for rhinitis.     pantoprazole  (PROTONIX ) 40 MG tablet Take 1 tablet (40 mg total) by mouth daily. Office visit for further refills 90 tablet 0    polyethylene glycol (MIRALAX  / GLYCOLAX ) 17 g packet Take 17 g by mouth daily. 14 each 0   Prenatal Vit-Fe Fumarate-FA (PRENATAL VITAMINS) 28-0.8 MG TABS Take 1 tablet by mouth daily.  12   senna-docusate (SENOKOT-S) 8.6-50 MG tablet Take 2 tablets by mouth 2 (two) times daily.     travoprost , benzalkonium, (TRAVATAN ) 0.004 % ophthalmic solution Place 1 drop into both eyes at bedtime.     zolpidem  (AMBIEN ) 10 MG tablet Take 1 tablet (10 mg total) by mouth at bedtime as needed for sleep. 15 tablet 0   diazepam  (VALIUM ) 5 MG tablet Take one tablet by mouth with food one hour prior to procedure. May repeat 30 minutes prior if needed. (Patient not taking: Reported on 11/01/2024) 2 tablet  0   No current facility-administered medications for this visit.    REVIEW OF SYSTEMS:    10 Point review of Systems was done is negative except as noted above.  PHYSICAL EXAMINATION: ECOG PERFORMANCE STATUS: 2 - Symptomatic, <50% confined to bed  Vitals:   11/01/24 1325 11/01/24 1326  BP: (!) 147/82 (!) 141/75  Pulse: 81   Resp: 17   Temp: 98.1 F (36.7 C)   SpO2: 99%    Wt Readings from Last 8 Encounters:  11/01/24 134 lb 6.4 oz (61 kg)  10/04/24 132 lb 3.2 oz (60 kg)  10/01/24 129 lb (58.5 kg)  09/13/24 130 lb 4.8 oz (59.1 kg)  09/05/24 135 lb (61.2 kg)  08/29/24 131 lb 12.8 oz (59.8 kg)  07/09/21 146 lb 13.2 oz (66.6 kg)  02/14/20 170 lb (77.1 kg)   Body mass index is 18.74 kg/m.  GENERAL: alert, in no acute distress and comfortable SKIN: no acute rashes, no significant lesions EYES: conjunctiva are pink and non-injected, sclera anicteric OROPHARYNX: MMM, no exudates, no oropharyngeal erythema or ulceration NECK: supple, no JVD LYMPH: no palpable lymphadenopathy in the cervical, axillary or inguinal regions LUNGS: clear to auscultation b/l with normal respiratory effort HEART: regular rate & rhythm ABDOMEN: normoactive bowel sounds, non tender, not distended, no  hepatosplenomegaly Extremity: no pedal edema PSYCH: alert & oriented x 3 with fluent speech NEURO: no focal motor/sensory deficits  LABORATORY DATA:  I have reviewed the data as listed     Latest Ref Rng & Units 11/01/2024    3:12 PM 11/01/2024    3:11 PM 10/12/2024    7:30 AM  CBC EXTENDED  WBC 4.0 - 10.5 K/uL 4.9   6.5   RBC 4.22 - 5.81 MIL/uL 3.03  3.10  3.07   Hemoglobin 13.0 - 17.0 g/dL 9.3   9.1   HCT 60.9 - 52.0 % 28.2   29.7   Platelets 150 - 400 K/uL 188   192   NEUT# 1.7 - 7.7 K/uL 1.2   1.8   Lymph# 0.7 - 4.0 K/uL 1.2   2.8    .CBC    Component Value Date/Time   WBC 4.9 11/01/2024 1512   WBC 6.5 10/12/2024 0730   RBC 3.03 (L) 11/01/2024 1512   RBC 3.10 (L) 11/01/2024 1511   HGB 9.3 (L) 11/01/2024 1512   HCT 28.2 (L) 11/01/2024 1512   HCT 29.5 (L) 08/29/2024 1218   PLT 188 11/01/2024 1512   MCV 93.1 11/01/2024 1512   MCH 30.7 11/01/2024 1512   MCHC 33.0 11/01/2024 1512   RDW 13.6 11/01/2024 1512   LYMPHSABS 1.2 11/01/2024 1512   MONOABS 2.4 (H) 11/01/2024 1512   EOSABS 0.0 11/01/2024 1512   BASOSABS 0.0 11/01/2024 1512    Lab Results  Component Value Date   MONOABS 1.8 (H) 10/12/2024   MONOABS 3.5 (H) 09/13/2024   MONOABS 4.3 (H) 08/29/2024   MONOABS 0.9 07/08/2021      Latest Ref Rng & Units 08/29/2024   12:17 PM 07/13/2021    2:57 AM 07/12/2021    3:08 AM  CMP  Glucose 70 - 99 mg/dL 82  97  898   BUN 8 - 23 mg/dL 37  24  27   Creatinine 0.61 - 1.24 mg/dL 7.95  8.86  8.71   Sodium 135 - 145 mmol/L 137  137  138   Potassium 3.5 - 5.1 mmol/L 4.8  4.8  4.8   Chloride 98 - 111 mmol/L  103  105  105   CO2 22 - 32 mmol/L 26  25  25    Calcium  8.9 - 10.3 mg/dL 9.5  8.7  8.6   Total Protein 6.5 - 8.1 g/dL 8.2     Total Bilirubin 0.0 - 1.2 mg/dL 0.4     Alkaline Phos 38 - 126 U/L 71     AST 15 - 41 U/L 14     ALT 0 - 44 U/L 8        Surgical Pathology 10/12/2024  CASE: [id number] PATIENT: Alvaro Towers Flow Pathology Report  Clinical history:  IgA monoclonal gammopathy  DIAGNOSIS:  Bone marrow, aspirate, flow cytometry: -  Monocytes constitute 33% of all cells. -  Less than 1% CD34 positive blasts by flow cytometry -  Plasma cells represent approximately 2% of cells and are unable to be characterized for light chains.  GATING AND PHENOTYPIC ANALYSIS:  Gated population: Flow cytometric immunophenotyping is performed using antibodies to the antigens listed in the table below. Electronic gates are placed around a cell cluster displaying light scatter properties corresponding to: lymphocytes and monocytes  Abnormal Cells in gated population: N/A  Phenotype of Abnormal Cells: N/A                      Lymphoid Antigens       Myeloid Antigens Miscellaneous CD2  tested    CD10 tested    CD11b     tested    CD45 tested CD3  tested    CD19 tested    CD11c     ND   HLA-Dr    tested CD4  tested    CD20 tested    CD13 tested    CD34 tested CD5  tested    CD22 ND   CD14 tested    CD38 tested CD7  tested    CD79b     ND   CD15 tested    CD138     ND CD8  tested    CD103     ND   CD16 tested    TdT  ND CD25 ND   CD200     tested    CD33 tested    CD123     tested TCRab     ND   sKappa    tested    CD64 tested    CD41 ND TCRgd     tested    sLambda   tested    CD117     tested    CD61 ND CD56 tested    cKappa    ND   MPO  ND   CD71 ND CD57 ND   cLambda   ND             CD235a    ND  GROSS DESCRIPTION:  Reference Bone Marrow case [id number].   Surgical Pathology 10/12/2024  CASE: [id number] PATIENT: Crawford Chandley Bone Marrow Report     Clinical History: IgA monoclonal gammopathy     DIAGNOSIS:  BONE MARROW, ASPIRATE, CLOT, CORE: - Hypercellular bone marrow secondary to a myeloid hyperplasia of both the neutrophilic and monocytic lineages as well as mildly increased blasts approximately 5% - Mild plasmacytosis with apparent kappa predominance and aberrant cyclin D1 staining - See note  PERIPHERAL BLOOD: -  Normochromic normocytic anemia - Monocytosis - Mild morphologic evidence of dysgranulopoiesis suggestive of possible dysplasia  Note: It is noted the patient has an IgA kappa restricted paraprotein. The plasma cells  are mildly increased at approximately 7% and appear to show cyclin D1 aberrant expression as well as an apparent kappa predominance by kappa/lambda-ISH (evaluation slightly hindered due to weak staining).  Given the concurrence of the kappa M protein and kappa predominance in the bone marrow by ISH as well as the apparent aberrant cyclin D1 staining on the plasma cells the findings are consistent with a plasma cell neoplasm at approximately 7% of the cellular marrow. Clinical correlation with pending plasma cell myeloma FISH is recommended.  In addition to the aforementioned plasmacytosis there is also a hypercellularity of the bone marrow with a prominent myeloid hyperplasia and erythroid hypoplasia.  This myeloid hyperplasia is secondary to a mixed neutrophilic and monocytic hyperplasia with mild left shift with blasts and approximately 5% by IHC.  This is in the presence of a peripheral blood monocytosis with findings suggestive of but not pathognomonic for dysgranulopoiesis/neutrophilic dysplasia.  It is noted that the monocytosis is present for at least a month, but was not present in July (borderline at 0.9).  The findings are highly concerning for a possible primary bone marrow process such as chronic myelomonocytic leukemia (CMML).  Clinical correlation with pending cytogenetics and myeloid NexGen sequencing is recommended for confirmatory purposes.  MICROSCOPIC DESCRIPTION:  PERIPHERAL BLOOD SMEAR: The peripheral blood smear and indices are reviewed revealing a normochromic normocytic anemia.  There is also a monocytosis.  Upon morphologic smear review the monocytosis is noted. There is abundant toxic vacuolization present; however, morphologically with a are  unremarkable.  The neutrophils appear to show a mild degree of hyper granularity and equivocal abnormal nuclear clumping.  The white blood cells are morphologically unremarkable.  The platelets show few scattered large/giant platelets but otherwise unremarkable.  BONE MARROW ASPIRATE: The bone marrow aspirate smear slides contain several cellular bone marrow spicules which are adequate for interpretation. Erythroid precursors: The erythroid series is present in significantly decreased number (ME ratio 14.5).  Of the nucleated red blood cells present there is some degree of nuclear irregularity, but definitive evidence of dysplasia on a limited number of evaluable red blood cells is not present. Granulocytic precursors: There is a significant myeloid hyperplasia that consists of both a hyperplasia in both the neutrophilic and monocytic lineages with left shift.  There is also apparent megaloblastoid type change present in the neutrophilic precursors with large/giant metamyelocytes present there is equivocal abnormal nuclear clumping present suggestive of an element of dysplasia.  Blasts are present at 2% Megakaryocytes: The megakaryocytes are present in adequate number with overall unremarkable morphology. Lymphocytes/plasma cells: Plasma cells are mildly increased at 7%.  TOUCH PREPARATIONS: The touch prep shows similar but suboptimal morphology.  The aspirate smear slides, refer to above.  CLOT AND BIOPSY: The decalcified bone marrow biopsy consists of a core of cortical and trabecular bone with hypercellular hematopoietic marrow which is adequate for interpretation..  The cellularity is approximately 75 to 80% secondary to a myeloid hyperplasia with left shift, but with maturation.  Megakaryocytes are mildly increased some with hyperchromatic forms.  The clot section shows similar morphology to the biopsy, refer to above  IMMUNOHISTOCHEMICAL STAINS: Immunohistochemical stains are  performed on both the biopsy and clot section with appropriate controls.  CD34 highlights mildly increased blasts and approximately 5%.  In addition CD34 is aberrantly staining on the megakaryocytes.  CD117 highlights mildly increased mononuclear cells.  They do not appear to correspond to the E-cadherin positive immature erythroid precursors that they appear overall decreased.  They could represent either  immature myeloid precursors or plasma cells.  A myeloperoxidase stain highlights the myeloid hyperplasia.  A CD138/MUM1 stain highlights mildly increased plasma cells (5% but within otherwise normal immuno architecture.  CD56 appears negative in the plasma cells.  Cyclin D1 is interpreted as positive in the plasma cells.  CD117 appears positive in the immature myeloid precursors predominantly in a paratrabecular cuffing fashion; however this some of the plasma cells may also be positive but is difficult to ascertain given the limited number of plasma cells in comparison to myeloid cells. There is an apparent kappa predominance by kappa/lambda ISH, but the interpretation is slightly hindered given weak staining.  IRON STAIN: Iron stains are performed on a bone marrow aspirate or touch imprint smear and section of clot. The controls stained appropriately.       Storage Iron: Adequate histiocytic iron stores      Ring Sideroblasts: Not identified in a limited number of immature erythroid precursors  ADDITIONAL DATA/TESTING: Conventional cytogenetics, plasma cell prognostic panel FISH as well as myeloid NexGen sequencing are pending and will be reported separately.  CELL COUNT DATA:  Bone Marrow count performed on 500 cells shows: Blasts/Other:  2%   Myeloid:  58% Promyelocytes: 2%   Erythroid:     4% Myelocytes:    2%   Lymphocytes:   6% Metamyelocytes:     0%   Plasma cells:  7% Bands:    1% Neutrophils:   51%  M:E ratio:     14.5 Eosinophils:   0% Basophils:      0% Monocytes:     25%  Lab Data: CBC performed on 10/12/2024 shows: WBC: 6.5 k/uL  Neutrophils:   19% Hgb: 9.1 g/dL  Lymphocytes:   63% HCT: 29.7 %    Monocytes:     45% MCV: 96.7 fL   Eosinophils:   0% RDW: 13.6 %    Basophils:     0% PLT: 192 k/uL    GROSS DESCRIPTION:  A. BM-aspirate smear  B. Received in B-plus fixative is a 2.5 x 1.0 x 0.2 cm aggregate of tissue.  Submitted entirely in B1.  C.  Received in B-plus fixative are 3 cores of firm bone ranging from 0.4-1.5 cm in length and each measuring 0.2 cm in diameter.  Submitted entirely in C1, following decalcification in Immunocal. (WC 10/12/2024)   Final Diagnosis performed by Mark LeGolvan DO.   Electronically signed 10/17/2024 Technical and / or Professional components performed at [facility], [address] [place], [place] [address].  Immunohistochemistry Technical component (if applicable) was performed at [facility]. [address], [place], [place] [address].   IMMUNOHISTOCHEMISTRY DISCLAIMER (if applicable): Some of these immunohistochemical stains may have been developed and the performance characteristics determine by [facility]. Some may not have been cleared or approved by the U.S. Food and Drug Administration. The FDA has determined that such clearance or approval is not necessary. This test is used for clinical purposes. It should not be regarded as investigational or for research. This laboratory is certified under the Clinical Laboratory Improvement Amendments of 1988 (CLIA-88) as qualified to perform high complexity clinical laboratory testing.  The controls stained appropriately.   IHC stains are performed on formalin fixed, paraffin embedded tissue using a 3,3diaminobenzidine (DAB) chromogen and Leica Bond Autostainer System. The staining intensity of the nucleus is score manually and is reported as the percentage of tumor cell  nuclei demonstrating specific nuclear staining. The specimens are  fixed in 10% Neutral Formalin for at least 6 hours and up to 72hrs. These tests are validated on decalcified tissue. Results should be interpreted with caution given the possibility of false negative results on decalcified specimens. Antibody Clones are as follows ER-clone 80F, PR-clone 16, Ki67- clone MM1. Some of these immunohistochemical stains may have been developed and the performance characteristics determined by [facility].      10/12/2024   CBC    RADIOGRAPHIC STUDIES: I have personally reviewed the radiological images as listed and agreed with the findings in the report. NM PET Image Initial (PI) Skull Base To Thigh Result Date: 10/19/2024 EXAM: PET AND CT SKULL BASE TO MID THIGH 10/18/2024 03:16:19 PM TECHNIQUE: RADIOPHARMACEUTICAL: 6.42 mCi F-18 FDG Uptake time 60 minutes. Glucose level 99 mg/dl. Blood pool SUV 2.0, liver parenchyma SUV 2.9. PET imaging was acquired from the base of the skull to the mid thighs. Non-contrast enhanced computed tomography was obtained for attenuation correction and anatomic localization. COMPARISON: None available. CLINICAL HISTORY: Unexplained weight loss, looking for occult malignancy. Monoclonal gammopathy of unknown significance. FINDINGS: HEAD AND NECK: No metabolically active cervical lymphadenopathy. Bilateral common carotid atheromatous vascular calcifications. CHEST: No metabolically active pulmonary nodules. Pro right hilar lymph nodes are observed with maximum SUV 3.8. 1.0 cm lower paratracheal node anterior to the carina on image 80 series 4 with maximum SUV 3.0. Mild anterior scarring along the minor fissure. Mild subpleural reticulation in the lungs, nonspecific. Chest atheroma. ABDOMEN AND PELVIS: No metabolically active intraperitoneal mass. No metabolically active lymphadenopathy. Physiologic activity within the gastrointestinal and genitourinary systems. Small  gallstones noted. Systemic atherosclerosis is present, including the aorta and iliac arteries. A predominant abdominal aortic caliber. Sigmoid colon diverticulosis. BONES AND SOFT TISSUE: Excess portal and accentuated metabolic activity in the skeleton. No focal skeletal lesion identified. No metabolically active aggressive osseous lesion. Right total hip prosthesis. Mild grade 1 anterolisthesis at L5-S1. IMPRESSION: 1. Small right hilar and lower paratracheal lymph nodes with moderate metabolic activity, potentially reactive or neoplastic. 2. Diffuse increased skeletal metabolic activity without focal lesion, suggestive of a systemic process. 3. Systemic atherosclerosis. 4. Mild subpleural reticulation and mild anterior scarring along the minor fissure. 5. Sigmoid diverticulosis. 6. Mild grade 1 anterolisthesis at L5-S1. 7. Cholelithiasis Electronically signed by: Ryan Salvage MD 10/19/2024 01:01 PM EDT RP Workstation: [id number]   CT BONE MARROW BIOPSY & ASPIRATION Result Date: 10/12/2024 CLINICAL DATA:  IgA monoclonal gammopathy and need for bone marrow biopsy. EXAM: CT GUIDED BONE MARROW ASPIRATION AND BIOPSY ANESTHESIA/SEDATION: Moderate (conscious) sedation was employed during this procedure. A total of Versed  1.0 mg and Fentanyl  50 mcg was administered intravenously. Moderate Sedation Time: 17 minutes. The patient's level of consciousness and vital signs were monitored continuously by radiology nursing throughout the procedure under my direct supervision. PROCEDURE: The procedure risks, benefits, and alternatives were explained to the patient. Questions regarding the procedure were encouraged and answered. The patient understands and consents to the procedure. A time out was performed prior to initiating the procedure. The right gluteal region was prepped with chlorhexidine . Sterile gown and sterile gloves were used for the procedure. Local anesthesia was provided with 1% Lidocaine . Under CT  guidance, an 11 gauge On Control bone cutting needle was advanced from a posterior approach into the right iliac bone. Needle positioning was confirmed with CT. Initial non heparinized and heparinized aspirate samples were obtained of bone marrow. Core biopsy was performed via the On Control drill needle. COMPLICATIONS: None FINDINGS: Inspection of initial aspirate  did reveal visible particles. Intact core biopsy sample was obtained. IMPRESSION: CT guided bone marrow biopsy of right posterior iliac bone with both aspirate and core samples obtained. Electronically Signed   By: Marcey Moan M.D.   On: 10/12/2024 13:40   VAS US  AAA DUPLEX Result Date: 10/01/2024 ABDOMINAL AORTA STUDY Patient Name:  DEAVEN URWIN  Date of Exam:   10/01/2024 Medical Rec #: [id number]      Accession #:    [id number] Date of Birth: 1941/09/27       Patient Gender: M Patient Age:   17 years Exam Location:  [address] Procedure:      VAS US  AAA DUPLEX Referring Phys: ROLLO LOUDER --------------------------------------------------------------------------------  Indications: Follow up exam for known AAA. Risk Factors: Hypertension, no history of smoking. Other Factors: Patient mentioned weight loss of approx 80 lbs. Endorses fear of                eating for approx 1 year.SABRA Spiro most of weight loss in the                past year.  Comparison Study: Echo 09/12/24: 6. Imaging findings suggesting dilated abdominal                   aorta, possibly up to 4 cm. Consider dedicated abdominal aorta                   imaging if clinicaly indicated. Performing Technologist: King Pierre RVT  Examination Guidelines: A complete evaluation includes B-mode imaging, spectral Doppler, color Doppler, and power Doppler as needed of all accessible portions of each vessel. Bilateral testing is considered an integral part of a complete examination. Limited examinations for reoccurring indications may be performed as noted.  Abdominal Aorta  Findings: +-------------+-------+----------+----------+---------+--------+--------+ Location     AP (cm)Trans (cm)PSV (cm/s)Waveform ThrombusComments +-------------+-------+----------+----------+---------+--------+--------+ Proximal     3.40   3.10      56                                  +-------------+-------+----------+----------+---------+--------+--------+ Mid          3.16   3.32      40                                  +-------------+-------+----------+----------+---------+--------+--------+ Distal       2.49   2.64                                          +-------------+-------+----------+----------+---------+--------+--------+ RT CIA Prox  1.4    1.3       63        triphasic                 +-------------+-------+----------+----------+---------+--------+--------+ RT CIA Distal                 71        biphasic                  +-------------+-------+----------+----------+---------+--------+--------+ RT EIA Prox                   69        biphasic                  +-------------+-------+----------+----------+---------+--------+--------+  RT EIA Distal                 71        biphasic                  +-------------+-------+----------+----------+---------+--------+--------+ LT CIA Prox  1.1    1.1       64        triphasic                 +-------------+-------+----------+----------+---------+--------+--------+ LT CIA Distal                 99        biphasic                  +-------------+-------+----------+----------+---------+--------+--------+ LT EIA Prox                   66        biphasic                  +-------------+-------+----------+----------+---------+--------+--------+ LT EIA Distal                 66        biphasic                  +-------------+-------+----------+----------+---------+--------+--------+ Celiac 245/55 cm/s Proximal SMA 398/1 cm/s Mid SMA 131/0 cm/s and turbulent Distal SMA  81/0 cm/s IMA 240/20 cm/s.  Summary: Abdominal Aorta: Dilatation of the abdominal aorta throughout. 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 Mid and distal SMA demonstrate waveforms suggesting proximal obstruction.  *See table(s) above for measurements and observations.  Electronically signed by Evalene Lunger MD on 10/01/2024 at 9:00:01 PM.    Final    LONG TERM MONITOR-LIVE TELEMETRY (3-14 DAYS) Result Date: 09/30/2024 Images from the original result were not included. Zio patch monitoring 7 days for syncope and collapse starting 09/05/2024: Predominant underlying rhythm is sinus rhythm, minimum heart rate 54 bpm, maximum heart rate 142 bpm with average heart rate of 78 bpm. First-degree AV block was present. Atrial fibrillation (1% burden) with RVR, heart rate ranging from 91 to 169 bpm, longest lasting 3 minutes. Occasional PACs, rare atrial couplets and triplets were present. Frequent isolated PVCs (burden 5.7%). Ventricular bigeminy (5.1 seconds) and ventricular trigeminy (40 seconds) were present. Patient's symptomatic events correlated with PVCs. Atrial fibrillation: Patch Wear Time:  6 days and 20 hours (2025-09-17T15:15:15-0400 to 2025-09-24T11:37:00-0400)  DG Bone Survey Met Result Date: 09/26/2024 CLINICAL DATA:  staging myeloma, MGUS (monoclonal gammopathy of unknown significance) EXAM: METASTATIC BONE SURVEY COMPARISON:  None Available. FINDINGS: No focal lytic or blastic bone lesions are identified. Degenerative changes are seen within the cervical spine. Degenerative changes are seen within the thoracic spine with remote superior endplate fractures T11 and T12 with mild loss of height. Remote appearing superior endplate fractures L2 and L4 are seen with mild loss of height and no retropulsion. Degenerative changes are seen at the lumbosacral junction. Lungs are clear. Cardiac size within normal limits. Moderate stool throughout the colon without  evidence of obstruction. 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 IMPRESSION: 1. No focal lytic or blastic bone lesions identified. 2. Remote appearing superior endplate fractures T11, T12, L2, and L4. Electronically Signed   By: Dorethia Molt M.D.   On: 09/26/2024 23:38   ECHOCARDIOGRAM COMPLETE Result Date: 09/16/2024    ECHOCARDIOGRAM REPORT   Patient Name:   SARGON SCOUTEN  Date of Exam: 09/12/2024 Medical Rec #:  [id number]       Height:       69.0 in Accession #:    [id number]      Weight:       135.0 lb Date of Birth:  1941/11/10        BSA:          1.748 m Patient Age:    83 years        BP:           148/83 mmHg Patient Gender: M               HR:           73 bpm. Exam Location:  [address] Procedure: 2D Echo, Cardiac Doppler and Color Doppler (Both Spectral and Color            Flow Doppler were utilized during procedure). Indications:    Near syncope [R55 (ICD-10-CM)]  History:        Patient has prior history of Echocardiogram examinations, most                 recent 09/11/2017.  Sonographer:    Rosaline Fujisawa MHA, RDMS, RVT, RDCS Referring Phys: [id number] ROLLO JONELLE LOUDER  Sonographer Comments: Technically difficult study due to poor echo windows and suboptimal apical window. Image acquisition challenging due to patient body habitus and Image acquisition challenging due to respiratory motion. IMPRESSIONS  1. Left ventricular ejection fraction, by estimation, is 60 to 65%. The left ventricle has normal function. Left ventricular endocardial border not optimally defined to evaluate regional wall motion. There is mild left ventricular hypertrophy. Left ventricular diastolic parameters are indeterminate.  2. Right ventricular systolic function is normal. The right ventricular size is normal. Tricuspid regurgitation signal is inadequate for assessing PA pressure.  3. The mitral valve is degenerative. Trivial mitral valve regurgitation. Though poorly assessed due to image quality, there is  likely at least mild mitral stenosis (MG 3 mmHg HR 74 bpm). Severe bulky mitral annular calcification and leaflet thickening with restricted motion.  4. The aortic valve is abnormal. There is moderate calcification of the aortic valve. There is mild thickening of the aortic valve. Aortic valve regurgitation is moderate. Aortic valve sclerosis/calcification is present, without any evidence of aortic stenosis.  5. The inferior vena cava is normal in size with greater than 50% respiratory variability, suggesting right atrial pressure of 3 mmHg.  6. Imaging findings suggesting dilated abdominal aorta, possibly up to 4 cm. Consider dedicated abdominal aorta imaging if clinicaly indicated. FINDINGS  Left Ventricle: Left ventricular ejection fraction, by estimation, is 60 to 65%. The left ventricle has normal function. Left ventricular endocardial border not optimally defined to evaluate regional wall motion. The left ventricular internal cavity size was normal in size. There is mild left ventricular hypertrophy. Abnormal (paradoxical) septal motion, consistent with left bundle branch block. Left ventricular diastolic parameters are indeterminate. Right Ventricle: The right ventricular size is normal. Right vetricular wall thickness was not well visualized. Right ventricular systolic function is normal. Tricuspid regurgitation signal is inadequate for assessing PA pressure. Left Atrium: Left atrial size was normal in size. Right Atrium: Right atrial size was normal in size. Pericardium: There is no evidence of pericardial effusion. Mitral Valve: The mitral valve is degenerative in appearance. Severe mitral annular calcification. Trivial mitral valve regurgitation. Mild mitral valve stenosis. MV peak gradient, 6.7 mmHg. The mean mitral valve gradient is 3.0 mmHg. Tricuspid Valve:  The tricuspid valve is normal in structure. Tricuspid valve regurgitation is trivial. No evidence of tricuspid stenosis. Aortic Valve: The aortic  valve is abnormal. There is moderate calcification of the aortic valve. There is mild thickening of the aortic valve. Aortic valve regurgitation is moderate. Aortic valve sclerosis/calcification is present, without any evidence of aortic stenosis. Aortic valve mean gradient measures 2.0 mmHg. Aortic valve peak gradient measures 3.9 mmHg. Aortic valve area, by VTI measures 2.33 cm. Pulmonic Valve: The pulmonic valve was normal in structure. Pulmonic valve regurgitation is not visualized. No evidence of pulmonic stenosis. Aorta: The aortic root is normal in size and structure. Venous: The inferior vena cava is normal in size with greater than 50% respiratory variability, suggesting right atrial pressure of 3 mmHg. IAS/Shunts: No atrial level shunt detected by color flow Doppler.  LEFT VENTRICLE PLAX 2D LVIDd:         4.15 cm LVIDs:         2.91 cm LV PW:         1.14 cm LV IVS:        0.96 cm LVOT diam:     1.77 cm LV SV:         40 LV SV Index:   23 LVOT Area:     2.46 cm  IVC IVC diam: 1.77 cm LEFT ATRIUM         Index LA diam:    2.43 cm 1.39 cm/m  AORTIC VALVE AV Area (Vmax):    1.97 cm AV Area (Vmean):   2.03 cm AV Area (VTI):     2.33 cm AV Vmax:           98.70 cm/s AV Vmean:          63.400 cm/s AV VTI:            0.173 m AV Peak Grad:      3.9 mmHg AV Mean Grad:      2.0 mmHg LVOT Vmax:         79.10 cm/s LVOT Vmean:        52.400 cm/s LVOT VTI:          0.164 m LVOT/AV VTI ratio: 0.95  AORTA Ao Root diam: 3.30 cm Ao Asc diam:  3.60 cm MITRAL VALVE MV Area (PHT): 2.03 cm     SHUNTS MV Area VTI:   1.33 cm     Systemic VTI:  0.16 m MV Peak grad:  6.7 mmHg     Systemic Diam: 1.77 cm MV Mean grad:  3.0 mmHg MV Vmax:       1.29 m/s MV Vmean:      74.6 cm/s MV Decel Time: 373 msec MV E velocity: 102.90 cm/s MV A velocity: 140.00 cm/s MV E/A ratio:  0.74 Soyla Merck MD Electronically signed by Soyla Merck MD Signature Date/Time: 09/16/2024/4:35:10 PM    Final    VAS US  CAROTID Result Date:  09/12/2024 Carotid Arterial Duplex Study Patient Name:  TRASON SHIFFLET  Date of Exam:   09/12/2024 Medical Rec #: [id number]      Accession #:    [id number] Date of Birth: 28-Jan-1941       Patient Gender: M Patient Age:   11 years Exam Location:  [address] Procedure:      VAS US  CAROTID Referring Phys: ROLLO LOUDER --------------------------------------------------------------------------------  Indications:  Carotid artery disease and patient presents with complaints of  overall weakness, dizziness and light headiness since the end of               July 2025. He denies any other cerebrovascular symptoms. Risk Factors: Hypertension, no history of smoking. Performing Technologist: Nanetta Shad RVT  Examination Guidelines: A complete evaluation includes B-mode imaging, spectral Doppler, color Doppler, and power Doppler as needed of all accessible portions of each vessel. Bilateral testing is considered an integral part of a complete examination. Limited examinations for reoccurring indications may be performed as noted.  Right Carotid Findings: +----------+--------+--------+--------+--------------------------+----------+           PSV cm/sEDV cm/sStenosisPlaque Description        Comments   +----------+--------+--------+--------+--------------------------+----------+ CCA Prox  51      7                                                    +----------+--------+--------+--------+--------------------------+----------+ CCA Distal68      17      <50%    heterogenous                         +----------+--------+--------+--------+--------------------------+----------+ ICA Prox  52      14      1-39%   heterogenous                         +----------+--------+--------+--------+--------------------------+----------+ ICA Distal96      31                                        steep dive +----------+--------+--------+--------+--------------------------+----------+ ECA        73      4               heterogenous and irregular           +----------+--------+--------+--------+--------------------------+----------+ +----------+--------+-------+----------------+-------------------+           PSV cm/sEDV cmsDescribe        Arm Pressure (mmHG) +----------+--------+-------+----------------+-------------------+ [id number]      0      Multiphasic, TWO852                 +----------+--------+-------+----------------+-------------------+ +---------+--------+--+--------+--+---------+ VertebralPSV cm/s42EDV cm/s12Antegrade +---------+--------+--+--------+--+---------+  Left Carotid Findings: +----------+--------+--------+--------+-----------------+----------------------+           PSV cm/sEDV cm/sStenosisPlaque           Comments                                                 Description                             +----------+--------+--------+--------+-----------------+----------------------+ CCA Prox  54      11                                                      +----------+--------+--------+--------+-----------------+----------------------+ CCA Distal48  14      <50%    heterogenous                            +----------+--------+--------+--------+-----------------+----------------------+ ICA Prox  53      15      1-39%   heterogenous                            +----------+--------+--------+--------+-----------------+----------------------+ ICA Distal149     37                               tortuous and turbulent +----------+--------+--------+--------+-----------------+----------------------+ ECA       68      7               heterogenous                            +----------+--------+--------+--------+-----------------+----------------------+ +----------+--------+--------+----------------+-------------------+           PSV cm/sEDV cm/sDescribe        Arm Pressure (mmHG)  +----------+--------+--------+----------------+-------------------+ Dlarojcpjw35      0       Multiphasic, TWO847                 +----------+--------+--------+----------------+-------------------+ +---------+--------+--+--------+--+---------+ VertebralPSV cm/s42EDV cm/s14Antegrade +---------+--------+--+--------+--+---------+   Summary: Right Carotid: Velocities in the right ICA are consistent with a 1-39% stenosis. Left Carotid: Velocities in the left ICA are consistent with a 1-39% stenosis. Vertebrals:  Bilateral vertebral arteries demonstrate antegrade flow. Subclavians: Normal flow hemodynamics were seen in bilateral subclavian              arteries. *See table(s) above for measurements and observations.  Electronically signed by Dorn Lesches MD on 09/12/2024 at 4:49:34 PM.    Final    ASSESSMENT & PLAN:    83 y.o. 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 MGUS (monoclonal gammopathy of unknown significance)  PLAN: - Discussed lab results on 10/12/2024 in detail with patient: CBC showed WBC of 6.5K increased from 6.2K, Hemoglobin of 9.1 decreased from 9.8, PLTs of 192K, and Monocytes of 1.8K decreased from 3.5K Discussed erythropoietin injections- likely to be helpful esp if EPO levels at <250 (his levels are 7.6) . Recommended starting a B-complex vitamin. Continue PO Iron.  CMP with Creatinine 2.04 increased from 1.13.    - Reviewed 10/19/2024 PET scan: 1. Small right hilar and lower paratracheal lymph nodes with moderate metabolic activity, potentially reactive or neoplastic.  2. Diffuse increased skeletal metabolic activity without focal lesion, suggestive of a systemic process.  3. Systemic atherosclerosis.  4. Mild subpleural reticulation and mild anterior scarring along the minor fissure.  5. Sigmoid diverticulosis.  6. Mild grade 1 anterolisthesis at L5-S1.  7. Cholelithiasis   - Reviewed 10/12/2024 Flow Path: Monocytes  constitute 33% of all cells. Less than 1% CD34 positive blasts by flow cytometry Plasma cells represent approximately 2% of cells and are unable to be characterized for light chains.  - Reviewed 10/12/2024 Bone Marrow Report: Hypercellular bone marrow secondary to a myeloid hyperplasia of both the neutrophilic and monocytic lineages as well as mildly increased blasts approximately 5% Mild plasmacytosis with apparent kappa predominance and aberrant cyclin D1 staining Normochromic normocytic anemia Monocytosis Mild morphologic evidence of dysgranulopoiesis suggestive of possible dysplasia Explained MGUS could progress to active  multiple myeloma, but this will just be monitored for now.  - Explained what CMML is and how it affects the body, how it's staged, and treatment options. We discussed that indication to start Vidaza treatment and the risks and benefits of this. Will send out Myeloid NGS to further prognosticate and define risk profile of patients CMML. Even if we don't treat his CMML directly, we could still provide supporting treatment to manage his symptoms. Patient wants to hold off on any treatment consideration till after the holidays and into the new year.  - Ordering Orders Placed This Encounter  Procedures   CBC with Differential (Cancer Center Only)   Lactate dehydrogenase   Haptoglobin   Multiple Myeloma Panel (SPEP&IFE w/QIG)   JAK2 (including V617F and Exon 12), MPL, and CALR-Next Generation Sequencing   Myeloid NGS   BCR-ABL1 FISH   TSH   T4, free   Testosterone, free, total   Erythropoietin   Vitamin D 25 hydroxy   Retic Panel   Sample to Blood Bank   FOLLOW-UP  Labs today RTC with Dr Onesimo with labs in 2nd week of jan 2025   The total time spent in the appointment was 60 minutes* . All of the patient's questions were answered and the patient knows to call the clinic with any problems, questions, or concerns.  Emaline Onesimo MD MS AAHIVMS [facility]  [facility] Hematology/Oncology Physician [facility]  *Total Encounter Time as defined by the Centers for Medicare and Medicaid Services includes, in addition to the face-to-face time of a patient visit (documented in the note above) non-face-to-face time: obtaining and reviewing outside history, ordering and reviewing medications, tests or procedures, care coordination (communications with other health care professionals or caregivers) and documentation in the medical record.  I,Emily Lagle,acting as a neurosurgeon for Emaline Onesimo, MD.,have documented all relevant documentation on the behalf of Emaline Onesimo, MD,as directed by  Emaline Onesimo, MD while in the presence of Emaline Onesimo, MD.  I have reviewed the above documentation for accuracy and completeness, and I agree with the above.  Sayvion Vigen, MD

## 2024-11-02 LAB — TSH: TSH: 2.2 u[IU]/mL (ref 0.350–4.500)

## 2024-11-02 LAB — HAPTOGLOBIN: Haptoglobin: 222 mg/dL (ref 38–329)

## 2024-11-03 LAB — TESTOSTERONE, FREE, TOTAL, SHBG
Sex Hormone Binding: 75.1 nmol/L (ref 19.3–76.4)
Testosterone, Free: 1.8 pg/mL — ABNORMAL LOW (ref 6.6–18.1)
Testosterone: 220 ng/dL — ABNORMAL LOW (ref 264–916)

## 2024-11-03 LAB — ERYTHROPOIETIN: Erythropoietin: 7.6 m[IU]/mL (ref 2.6–18.5)

## 2024-11-05 LAB — MULTIPLE MYELOMA PANEL, SERUM
Albumin SerPl Elph-Mcnc: 4.1 g/dL (ref 2.9–4.4)
Albumin/Glob SerPl: 1.2 (ref 0.7–1.7)
Alpha 1: 0.3 g/dL (ref 0.0–0.4)
Alpha2 Glob SerPl Elph-Mcnc: 0.9 g/dL (ref 0.4–1.0)
B-Globulin SerPl Elph-Mcnc: 1 g/dL (ref 0.7–1.3)
Gamma Glob SerPl Elph-Mcnc: 1.5 g/dL (ref 0.4–1.8)
Globulin, Total: 3.7 g/dL (ref 2.2–3.9)
IgA: 996 mg/dL — ABNORMAL HIGH (ref 61–437)
IgG (Immunoglobin G), Serum: 861 mg/dL (ref 603–1613)
IgM (Immunoglobulin M), Srm: 285 mg/dL — ABNORMAL HIGH (ref 15–143)
M Protein SerPl Elph-Mcnc: 0.6 g/dL — ABNORMAL HIGH
Total Protein ELP: 7.8 g/dL (ref 6.0–8.5)

## 2024-11-05 LAB — BCR-ABL1 FISH
Cells Analyzed: 200
Cells Counted: 200

## 2024-11-06 NOTE — Progress Notes (Signed)
 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 Labs ordered and will FU with Dr Onesimo.

## 2024-11-07 NOTE — Progress Notes (Incomplete)
 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 male who has been referred to us  by Dr. 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 accompanied by wife.   He says that his PCP, Dr. Yolande has been monitoring his anemia for two years, and he has been instructed to take a prenatal vitamin, but after no improvement and worsening symptoms referred to oncology for evaluation.  He says that three years ago, he tripped and crushed his right hip and broke his femur. Subsequently underwent a full hip replacement. At that time weighed 180 LBS, currently 130 LBS for a total of 20 LBS over two years. Gradually his appetite and energy levels diminished. Endorses occasionally extreme fatigue or persisting mild fatigue.  His weight loss has improved within the past couple of months. Does occasionally use Ensure for nutritional supplementation.  He also has a history of bleeding ulcers and there was an incident several years ago where he was admitted to the hospital for severe anemia. From this incident, his wife notes that he's never returned to baseline.   Also was recently diagnosed with Afib, but has not been placed on medication due to concern of syncope. During Afib attacks, he is extremely weak and fatigued.  Wife notes that he has never laid down during the day, but more recently over the years has been occurring more frequently.   He denies a new cough, respiratory infections, SOB, other illnesses, bleeding issues,  abdominal distention, leg swelling, joint pain or swelling. Denies history of prostate issues.  He does note that he experiences abdominal fullness after eating. Also, he does reportedly experience back pain due to an injury. He was not recommended to have surgery for this, so is instead being managed with medication.  He says that his vision and hearing has been degrading.   MEDICAL HISTORY:  Past Medical History:  Diagnosis Date  . Anemia   . Arthritis    both hips, back (09/09/2017)  . Chronic back pain    middle and lower back (09/09/2017)  . Colon polyps   . Daily headache   . Diverticulosis   . GERD (gastroesophageal reflux disease)   . Glaucoma, both eyes   . H pylori ulcer   . Hard of hearing   . HTN (hypertension)   . 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 Lumbar disc disease   . Migraine    q 3-4 months (09/09/2017)  . Seasonal allergies     SURGICAL HISTORY: Past Surgical History:  Procedure Laterality Date  . APPENDECTOMY    . CYST EXCISION Right ~ 1971   knee  . ESOPHAGOGASTRODUODENOSCOPY N/A 01/15/2018   Procedure: ESOPHAGOGASTRODUODENOSCOPY (EGD);  Surgeon: Aneita Gwendlyn DASEN, MD;  Location: [facility] ENDOSCOPY;  Service: Endoscopy;  Laterality: N/A;  . 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   Procedure: TOTAL HIP ARTHROPLASTY ANTERIOR APPROACH;  Surgeon: Fidel Rogue, MD;  Location: [facility] ORS;  Service: Orthopedics;  Laterality: Right;    SOCIAL HISTORY: Social History   Socioeconomic History  . Marital status: Married    Spouse name: Not on file  .  Number of children: 2  . Years of education: Not on file  . Highest education level: Not on file  Occupational History  . Occupation: security guard - [employer]  Tobacco Use  . Smoking status: Never  . Smokeless tobacco: Never  Vaping Use  . Vaping status: Never Used  Substance and Sexual Activity  . Alcohol  use: Yes    Comment: 09/09/2017 couple glasses of wine q 6 months  . Drug use: No  . Sexual  activity: Not on file  Other Topics Concern  . Not on file  Social History Narrative  . Not on file   Social Drivers of Health   Financial Resource Strain: Not on file  Food Insecurity: No Food Insecurity (11/01/2024)   Hunger Vital Sign   . Worried About Programme Researcher, Broadcasting/film/video in the Last Year: Never true   . Ran Out of Food in the Last Year: Never true  Transportation Needs: No Transportation Needs (11/01/2024)   PRAPARE - Transportation   . Lack of Transportation (Medical): No   . Lack of Transportation (Non-Medical): No  Physical Activity: Not on file  Stress: Not on file  Social Connections: Not on file  Intimate Partner Violence: Not At Risk (11/01/2024)   Humiliation, Afraid, Rape, and Kick questionnaire   . Fear of Current or Ex-Partner: No   . Emotionally Abused: No   . Physically Abused: No   . Sexually Abused: No   Social History   Social History Narrative  . Not on file    FAMILY HISTORY: Family History  Problem Relation Age of Onset  . Heart disease Father   . Emphysema Father   . Diverticulosis Father   . Cancer Mother        gallbladder-mets  . Arthritis Mother   . Liver cancer Mother     ALLERGIES:  has no known allergies.  MEDICATIONS:  Current Outpatient Medications  Medication Sig Dispense Refill  . COMBIGAN  0.2-0.5 % ophthalmic solution Place 1 drop into both eyes 2 (two) times daily.  2  . cyclobenzaprine (FLEXERIL) 10 MG tablet Take 10 mg by mouth as needed for muscle spasms.    . feeding supplement, ENSURE ENLIVE, (ENSURE ENLIVE) LIQD Take 237 mLs by mouth 2 (two) times daily between meals. 237 mL 0  . HYDROcodone -acetaminophen  (NORCO) 7.5-325 MG tablet Take 1-2 tablets by mouth every 4 (four) hours as needed for severe pain (pain score 7-10). 30 tablet 0  . ipratropium (ATROVENT ) 0.06 % nasal spray Place 2 sprays into both nostrils as needed for rhinitis.    . pantoprazole  (PROTONIX ) 40 MG tablet Take 1 tablet (40 mg total) by mouth daily.  Office visit for further refills 90 tablet 0  . polyethylene glycol (MIRALAX  / GLYCOLAX ) 17 g packet Take 17 g by mouth daily. 14 each 0  . Prenatal Vit-Fe Fumarate-FA (PRENATAL VITAMINS) 28-0.8 MG TABS Take 1 tablet by mouth daily.  12  . senna-docusate (SENOKOT-S) 8.6-50 MG tablet Take 2 tablets by mouth 2 (two) times daily.    . travoprost , benzalkonium, (TRAVATAN ) 0.004 % ophthalmic solution Place 1 drop into both eyes at bedtime.    . zolpidem  (AMBIEN ) 10 MG tablet Take 1 tablet (10 mg total) by mouth at bedtime as needed for sleep. 15 tablet 0  . diazepam  (VALIUM ) 5 MG tablet Take one tablet by mouth with food one hour prior to procedure. May repeat 30 minutes prior if needed. (Patient not taking: Reported on 11/01/2024) 2 tablet  0   No current facility-administered medications for this visit.    REVIEW OF SYSTEMS:    10 Point review of Systems was done is negative except as noted above.  PHYSICAL EXAMINATION: ECOG PERFORMANCE STATUS: 2 - Symptomatic, <50% confined to bed  Vitals:   11/01/24 1325 11/01/24 1326  BP: (!) 147/82 (!) 141/75  Pulse: 81   Resp: 17   Temp: 98.1 F (36.7 C)   SpO2: 99%    Wt Readings from Last 8 Encounters:  11/01/24 134 lb 6.4 oz (61 kg)  10/04/24 132 lb 3.2 oz (60 kg)  10/01/24 129 lb (58.5 kg)  09/13/24 130 lb 4.8 oz (59.1 kg)  09/05/24 135 lb (61.2 kg)  08/29/24 131 lb 12.8 oz (59.8 kg)  07/09/21 146 lb 13.2 oz (66.6 kg)  02/14/20 170 lb (77.1 kg)   Body mass index is 18.74 kg/m.  GENERAL: alert, in no acute distress and comfortable SKIN: no acute rashes, no significant lesions EYES: conjunctiva are pink and non-injected, sclera anicteric OROPHARYNX: MMM, no exudates, no oropharyngeal erythema or ulceration NECK: supple, no JVD LYMPH: no palpable lymphadenopathy in the cervical, axillary or inguinal regions LUNGS: clear to auscultation b/l with normal respiratory effort HEART: regular rate & rhythm ABDOMEN: normoactive bowel sounds,  non tender, not distended, no hepatosplenomegaly Extremity: no pedal edema PSYCH: alert & oriented x 3 with fluent speech NEURO: no focal motor/sensory deficits  LABORATORY DATA:  I have reviewed the data as listed     Latest Ref Rng & Units 10/12/2024    7:30 AM 09/13/2024    3:27 PM 08/29/2024   12:18 PM  CBC EXTENDED  WBC 4.0 - 10.5 K/uL 6.5  6.2    RBC 4.22 - 5.81 MIL/uL 3.07  3.15    Hemoglobin 13.0 - 17.0 g/dL 9.1  9.8    HCT 60.9 - 52.0 % 29.7  29.5  29.5   Platelets 150 - 400 K/uL 192  180    NEUT# 1.7 - 7.7 K/uL 1.8  1.3    Lymph# 0.7 - 4.0 K/uL 2.8  1.4     CBC w/ Differential:  Lab Results  Component Value Date   WBC 6.5 10/12/2024   RBC 3.07 (L) 10/12/2024   HGB 9.1 (L) 10/12/2024   HCT 29.7 (L) 10/12/2024   PLT 192 10/12/2024   MCV 96.7 10/12/2024   MCH 29.6 10/12/2024   MCHC 30.6 10/12/2024   RDW 13.6 10/12/2024   LYMPHSABS 2.8 10/12/2024   MONOABS 1.8 (H) 10/12/2024   BASOSABS 0.0 10/12/2024   Lab Results  Component Value Date   MONOABS 1.8 (H) 10/12/2024   MONOABS 3.5 (H) 09/13/2024   MONOABS 4.3 (H) 08/29/2024   MONOABS 0.9 07/08/2021      Latest Ref Rng & Units 08/29/2024   12:17 PM 07/13/2021    2:57 AM 07/12/2021    3:08 AM  CMP  Glucose 70 - 99 mg/dL 82  97  898   BUN 8 - 23 mg/dL 37  24  27   Creatinine 0.61 - 1.24 mg/dL 7.95  8.86  8.71   Sodium 135 - 145 mmol/L 137  137  138   Potassium 3.5 - 5.1 mmol/L 4.8  4.8  4.8   Chloride 98 - 111 mmol/L 103  105  105   CO2 22 - 32 mmol/L 26  25  25    Calcium  8.9 - 10.3 mg/dL 9.5  8.7  8.6   Total Protein 6.5 - 8.1 g/dL  8.2     Total Bilirubin 0.0 - 1.2 mg/dL 0.4     Alkaline Phos 38 - 126 U/L 71     AST 15 - 41 U/L 14     ALT 0 - 44 U/L 8        Surgical Pathology 10/12/2024  CASE: [id number] PATIENT: Andrew Pacheco Flow Pathology Report  Clinical history: IgA monoclonal gammopathy  DIAGNOSIS:  Bone marrow, aspirate, flow cytometry: -  Monocytes constitute 33% of all cells. -  Less  than 1% CD34 positive blasts by flow cytometry -  Plasma cells represent approximately 2% of cells and are unable to be characterized for light chains.  GATING AND PHENOTYPIC ANALYSIS:  Gated population: Flow cytometric immunophenotyping is performed using antibodies to the antigens listed in the table below. Electronic gates are placed around a cell cluster displaying light scatter properties corresponding to: lymphocytes and monocytes  Abnormal Cells in gated population: N/A  Phenotype of Abnormal Cells: N/A                      Lymphoid Antigens       Myeloid Antigens Miscellaneous CD2  tested    CD10 tested    CD11b     tested    CD45 tested CD3  tested    CD19 tested    CD11c     ND   HLA-Dr    tested CD4  tested    CD20 tested    CD13 tested    CD34 tested CD5  tested    CD22 ND   CD14 tested    CD38 tested CD7  tested    CD79b     ND   CD15 tested    CD138     ND CD8  tested    CD103     ND   CD16 tested    TdT  ND CD25 ND   CD200     tested    CD33 tested    CD123     tested TCRab     ND   sKappa    tested    CD64 tested    CD41 ND TCRgd     tested    sLambda   tested    CD117     tested    CD61 ND CD56 tested    cKappa    ND   MPO  ND   CD71 ND CD57 ND   cLambda   ND             CD235a    ND  GROSS DESCRIPTION:  Reference Bone Marrow case [id number].   Surgical Pathology 10/12/2024  CASE: [id number] PATIENT: Andrew Pacheco Bone Marrow Report     Clinical History: IgA monoclonal gammopathy     DIAGNOSIS:  BONE MARROW, ASPIRATE, CLOT, CORE: - Hypercellular bone marrow secondary to a myeloid hyperplasia of both the neutrophilic and monocytic lineages as well as mildly increased blasts approximately 5% - Mild plasmacytosis with apparent kappa predominance and aberrant cyclin D1 staining - See note  PERIPHERAL BLOOD: - Normochromic normocytic anemia - Monocytosis - Mild morphologic evidence of dysgranulopoiesis suggestive of possible dysplasia  Note:  It is noted the patient has an IgA kappa restricted paraprotein. The plasma cells are mildly increased at approximately 7% and appear to show cyclin D1 aberrant expression as well as an apparent kappa predominance by kappa/lambda-ISH (evaluation slightly hindered due to weak staining).  Given the concurrence of the kappa  M protein and kappa predominance in the bone marrow by ISH as well as the apparent aberrant cyclin D1 staining on the plasma cells the findings are consistent with a plasma cell neoplasm at approximately 7% of the cellular marrow. Clinical correlation with pending plasma cell myeloma FISH is recommended.  In addition to the aforementioned plasmacytosis there is also a hypercellularity of the bone marrow with a prominent myeloid hyperplasia and erythroid hypoplasia.  This myeloid hyperplasia is secondary to a mixed neutrophilic and monocytic hyperplasia with mild left shift with blasts and approximately 5% by IHC.  This is in the presence of a peripheral blood monocytosis with findings suggestive of but not pathognomonic for dysgranulopoiesis/neutrophilic dysplasia.  It is noted that the monocytosis is present for at least a month, but was not present in July (borderline at 0.9).  The findings are highly concerning for a possible primary bone marrow process such as chronic myelomonocytic leukemia (CMML).  Clinical correlation with pending cytogenetics and myeloid NexGen sequencing is recommended for confirmatory purposes.  MICROSCOPIC DESCRIPTION:  PERIPHERAL BLOOD SMEAR: The peripheral blood smear and indices are reviewed revealing a normochromic normocytic anemia.  There is also a monocytosis.  Upon morphologic smear review the monocytosis is noted. There is abundant toxic vacuolization present; however, morphologically with a are unremarkable.  The neutrophils appear to show a mild degree of hyper granularity and equivocal abnormal nuclear clumping.  The  white blood cells are morphologically unremarkable.  The platelets show few scattered large/giant platelets but otherwise unremarkable.  BONE MARROW ASPIRATE: The bone marrow aspirate smear slides contain several cellular bone marrow spicules which are adequate for interpretation. Erythroid precursors: The erythroid series is present in significantly decreased number (ME ratio 14.5).  Of the nucleated red blood cells present there is some degree of nuclear irregularity, but definitive evidence of dysplasia on a limited number of evaluable red blood cells is not present. Granulocytic precursors: There is a significant myeloid hyperplasia that consists of both a hyperplasia in both the neutrophilic and monocytic lineages with left shift.  There is also apparent megaloblastoid type change present in the neutrophilic precursors with large/giant metamyelocytes present there is equivocal abnormal nuclear clumping present suggestive of an element of dysplasia.  Blasts are present at 2% Megakaryocytes: The megakaryocytes are present in adequate number with overall unremarkable morphology. Lymphocytes/plasma cells: Plasma cells are mildly increased at 7%.  TOUCH PREPARATIONS: The touch prep shows similar but suboptimal morphology.  The aspirate smear slides, refer to above.  CLOT AND BIOPSY: The decalcified bone marrow biopsy consists of a core of cortical and trabecular bone with hypercellular hematopoietic marrow which is adequate for interpretation..  The cellularity is approximately 75 to 80% secondary to a myeloid hyperplasia with left shift, but with maturation.  Megakaryocytes are mildly increased some with hyperchromatic forms.  The clot section shows similar morphology to the biopsy, refer to above  IMMUNOHISTOCHEMICAL STAINS: Immunohistochemical stains are performed on both the biopsy and clot section with appropriate controls.  CD34 highlights mildly increased blasts and  approximately 5%.  In addition CD34 is aberrantly staining on the megakaryocytes.  CD117 highlights mildly increased mononuclear cells.  They do not appear to correspond to the E-cadherin positive immature erythroid precursors that they appear overall decreased.  They could represent either immature myeloid precursors or plasma cells.  A myeloperoxidase stain highlights the myeloid hyperplasia.  A CD138/MUM1 stain highlights mildly increased plasma cells (5% but within otherwise normal immuno architecture.  CD56 appears negative in the plasma  cells.  Cyclin D1 is interpreted as positive in the plasma cells.  CD117 appears positive in the immature myeloid precursors predominantly in a paratrabecular cuffing fashion; however this some of the plasma cells may also be positive but is difficult to ascertain given the limited number of plasma cells in comparison to myeloid cells. There is an apparent kappa predominance by kappa/lambda ISH, but the interpretation is slightly hindered given weak staining.  IRON STAIN: Iron stains are performed on a bone marrow aspirate or touch imprint smear and section of clot. The controls stained appropriately.       Storage Iron: Adequate histiocytic iron stores      Ring Sideroblasts: Not identified in a limited number of immature erythroid precursors  ADDITIONAL DATA/TESTING: Conventional cytogenetics, plasma cell prognostic panel FISH as well as myeloid NexGen sequencing are pending and will be reported separately.  CELL COUNT DATA:  Bone Marrow count performed on 500 cells shows: Blasts/Other:  2%   Myeloid:  58% Promyelocytes: 2%   Erythroid:     4% Myelocytes:    2%   Lymphocytes:   6% Metamyelocytes:     0%   Plasma cells:  7% Bands:    1% Neutrophils:   51%  M:E ratio:     14.5 Eosinophils:   0% Basophils:     0% Monocytes:     25%  Lab Data: CBC performed on 10/12/2024 shows: WBC: 6.5 k/uL  Neutrophils:   19% Hgb: 9.1 g/dL   Lymphocytes:   63% HCT: 29.7 %    Monocytes:     45% MCV: 96.7 fL   Eosinophils:   0% RDW: 13.6 %    Basophils:     0% PLT: 192 k/uL    GROSS DESCRIPTION:  A. BM-aspirate smear  B. Received in B-plus fixative is a 2.5 x 1.0 x 0.2 cm aggregate of tissue.  Submitted entirely in B1.  C.  Received in B-plus fixative are 3 cores of firm bone ranging from 0.4-1.5 cm in length and each measuring 0.2 cm in diameter.  Submitted entirely in C1, following decalcification in Immunocal. (WC 10/12/2024)   Final Diagnosis performed by Mark LeGolvan DO.   Electronically signed 10/17/2024 Technical and / or Professional components performed at [facility], [address], [place], [place] [address].  Immunohistochemistry Technical component (if applicable) was performed at [facility]. [address], [address], [place], [place] [address].   IMMUNOHISTOCHEMISTRY DISCLAIMER (if applicable): Some of these immunohistochemical stains may have been developed and the performance characteristics determine by [facility]. Some may not have been cleared or approved by the U.S. Food and Drug Administration. The FDA has determined that such clearance or approval is not necessary. This test is used for clinical purposes. It should not be regarded as investigational or for research. This laboratory is certified under the Clinical Laboratory Improvement Amendments of 1988 (CLIA-88) as qualified to perform high complexity clinical laboratory testing.  The controls stained appropriately.   IHC stains are performed on formalin fixed, paraffin embedded tissue using a 3,3diaminobenzidine (DAB) chromogen and Leica Bond Autostainer System. The staining intensity of the nucleus is score manually and is reported as the percentage of tumor cell nuclei demonstrating specific nuclear staining. The specimens are fixed in 10% Neutral Formalin for at least 6 hours  and up to 72hrs. These tests are validated on decalcified tissue. Results should be interpreted with caution given the possibility of false negative results on decalcified specimens.  Antibody Clones are as follows ER-clone 70F, PR-clone 16, Ki67- clone MM1. Some of these immunohistochemical stains may have been developed and the performance characteristics determined by [facility].      10/12/2024   CBC    RADIOGRAPHIC STUDIES: I have personally reviewed the radiological images as listed and agreed with the findings in the report. NM PET Image Initial (PI) Skull Base To Thigh Result Date: 10/19/2024 EXAM: PET AND CT SKULL BASE TO MID THIGH 10/18/2024 03:16:19 PM TECHNIQUE: RADIOPHARMACEUTICAL: 6.42 mCi F-18 FDG Uptake time 60 minutes. Glucose level 99 mg/dl. Blood pool SUV 2.0, liver parenchyma SUV 2.9. PET imaging was acquired from the base of the skull to the mid thighs. Non-contrast enhanced computed tomography was obtained for attenuation correction and anatomic localization. COMPARISON: None available. CLINICAL HISTORY: Unexplained weight loss, looking for occult malignancy. Monoclonal gammopathy of unknown significance. FINDINGS: HEAD AND NECK: No metabolically active cervical lymphadenopathy. Bilateral common carotid atheromatous vascular calcifications. CHEST: No metabolically active pulmonary nodules. Pro right hilar lymph nodes are observed with maximum SUV 3.8. 1.0 cm lower paratracheal node anterior to the carina on image 80 series 4 with maximum SUV 3.0. Mild anterior scarring along the minor fissure. Mild subpleural reticulation in the lungs, nonspecific. Chest atheroma. ABDOMEN AND PELVIS: No metabolically active intraperitoneal mass. No metabolically active lymphadenopathy. Physiologic activity within the gastrointestinal and genitourinary systems. Small gallstones noted. Systemic atherosclerosis is present, including the aorta and iliac arteries. A predominant abdominal  aortic caliber. Sigmoid colon diverticulosis. BONES AND SOFT TISSUE: Excess portal and accentuated metabolic activity in the skeleton. No focal skeletal lesion identified. No metabolically active aggressive osseous lesion. Right total hip prosthesis. Mild grade 1 anterolisthesis at L5-S1. IMPRESSION: 1. Small right hilar and lower paratracheal lymph nodes with moderate metabolic activity, potentially reactive or neoplastic. 2. Diffuse increased skeletal metabolic activity without focal lesion, suggestive of a systemic process. 3. Systemic atherosclerosis. 4. Mild subpleural reticulation and mild anterior scarring along the minor fissure. 5. Sigmoid diverticulosis. 6. Mild grade 1 anterolisthesis at L5-S1. 7. Cholelithiasis Electronically signed by: Ryan Salvage MD 10/19/2024 01:01 PM EDT RP Workstation: [id number]   CT BONE MARROW BIOPSY & ASPIRATION Result Date: 10/12/2024 CLINICAL DATA:  IgA monoclonal gammopathy and need for bone marrow biopsy. EXAM: CT GUIDED BONE MARROW ASPIRATION AND BIOPSY ANESTHESIA/SEDATION: Moderate (conscious) sedation was employed during this procedure. A total of Versed  1.0 mg and Fentanyl  50 mcg was administered intravenously. Moderate Sedation Time: 17 minutes. The patient's level of consciousness and vital signs were monitored continuously by radiology nursing throughout the procedure under my direct supervision. PROCEDURE: The procedure risks, benefits, and alternatives were explained to the patient. Questions regarding the procedure were encouraged and answered. The patient understands and consents to the procedure. A time out was performed prior to initiating the procedure. The right gluteal region was prepped with chlorhexidine . Sterile gown and sterile gloves were used for the procedure. Local anesthesia was provided with 1% Lidocaine . Under CT guidance, an 11 gauge On Control bone cutting needle was advanced from a posterior approach into the right iliac bone. Needle  positioning was confirmed with CT. Initial non heparinized and heparinized aspirate samples were obtained of bone marrow. Core biopsy was performed via the On Control drill needle. COMPLICATIONS: None FINDINGS: Inspection of initial aspirate did reveal visible particles. Intact core biopsy sample was obtained. IMPRESSION: CT guided bone marrow biopsy of right posterior iliac bone with both aspirate and core samples obtained. Electronically Signed   By: Marcey Luverne HERO.D.  On: 10/12/2024 13:40   VAS US  AAA DUPLEX Result Date: 10/01/2024 ABDOMINAL AORTA STUDY Patient Name:  Andrew Pacheco  Date of Exam:   10/01/2024 Medical Rec #: [id number]      Accession #:    [id number] Date of Birth: 10-15-1941       Patient Gender: M Patient Age:   30 years Exam Location:  [address] Procedure:      VAS US  AAA DUPLEX Referring Phys: ROLLO LOUDER --------------------------------------------------------------------------------  Indications: Follow up exam for known AAA. Risk Factors: Hypertension, no history of smoking. Other Factors: Patient mentioned weight loss of approx 80 lbs. Endorses fear of                eating for approx 1 year.SABRA Spiro most of weight loss in the                past year.  Comparison Study: Echo 09/12/24: 6. Imaging findings suggesting dilated abdominal                   aorta, possibly up to 4 cm. Consider dedicated abdominal aorta                   imaging if clinicaly indicated. Performing Technologist: King Pierre RVT  Examination Guidelines: A complete evaluation includes B-mode imaging, spectral Doppler, color Doppler, and power Doppler as needed of all accessible portions of each vessel. Bilateral testing is considered an integral part of a complete examination. Limited examinations for reoccurring indications may be performed as noted.  Abdominal Aorta Findings: +-------------+-------+----------+----------+---------+--------+--------+ Location     AP (cm)Trans (cm)PSV  (cm/s)Waveform ThrombusComments +-------------+-------+----------+----------+---------+--------+--------+ Proximal     3.40   3.10      56                                  +-------------+-------+----------+----------+---------+--------+--------+ Mid          3.16   3.32      40                                  +-------------+-------+----------+----------+---------+--------+--------+ Distal       2.49   2.64                                          +-------------+-------+----------+----------+---------+--------+--------+ RT CIA Prox  1.4    1.3       63        triphasic                 +-------------+-------+----------+----------+---------+--------+--------+ RT CIA Distal                 71        biphasic                  +-------------+-------+----------+----------+---------+--------+--------+ RT EIA Prox                   69        biphasic                  +-------------+-------+----------+----------+---------+--------+--------+ RT EIA Distal                 71  biphasic                  +-------------+-------+----------+----------+---------+--------+--------+ LT CIA Prox  1.1    1.1       64        triphasic                 +-------------+-------+----------+----------+---------+--------+--------+ LT CIA Distal                 99        biphasic                  +-------------+-------+----------+----------+---------+--------+--------+ LT EIA Prox                   66        biphasic                  +-------------+-------+----------+----------+---------+--------+--------+ LT EIA Distal                 66        biphasic                  +-------------+-------+----------+----------+---------+--------+--------+ Celiac 245/55 cm/s Proximal SMA 398/1 cm/s Mid SMA 131/0 cm/s and turbulent Distal SMA 81/0 cm/s IMA 240/20 cm/s.  Summary: Abdominal Aorta: Dilatation of the abdominal aorta throughout. SMA waveform is normal  triphasic however the systolic velocity suggests >70% stenosis. Mid and distal SMA demonstrate waveforms suggesting proximal obstruction.  *See table(s) above for measurements and observations.  Electronically signed by Evalene Lunger MD on 10/01/2024 at 9:00:01 PM.    Final    LONG TERM MONITOR-LIVE TELEMETRY (3-14 DAYS) Result Date: 09/30/2024 Images from the original result were not included. Zio patch monitoring 7 days for syncope and collapse starting 09/05/2024: Predominant underlying rhythm is sinus rhythm, minimum heart rate 54 bpm, maximum heart rate 142 bpm with average heart rate of 78 bpm. First-degree AV block was present. Atrial fibrillation (1% burden) with RVR, heart rate ranging from 91 to 169 bpm, longest lasting 3 minutes. Occasional PACs, rare atrial couplets and triplets were present. Frequent isolated PVCs (burden 5.7%). Ventricular bigeminy (5.1 seconds) and ventricular trigeminy (40 seconds) were present. Patient's symptomatic events correlated with PVCs. Atrial fibrillation: Patch Wear Time:  6 days and 20 hours (2025-09-17T15:15:15-0400 to 2025-09-24T11:37:00-0400)  DG Bone Survey Met Result Date: 09/26/2024 CLINICAL DATA:  staging myeloma, MGUS (monoclonal gammopathy of unknown significance) EXAM: METASTATIC BONE SURVEY COMPARISON:  None Available. FINDINGS: No focal lytic or blastic bone lesions are identified. Degenerative changes are seen within the cervical spine. Degenerative changes are seen within the thoracic spine with remote superior endplate fractures T11 and T12 with mild loss of height. Remote appearing superior endplate fractures L2 and L4 are seen with mild loss of height and no retropulsion. Degenerative changes are seen at the lumbosacral junction. Lungs are clear. Cardiac size within normal limits. Moderate stool throughout the colon without evidence of obstruction. 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 IMPRESSION: 1. No focal lytic or blastic bone  lesions identified. 2. Remote appearing superior endplate fractures T11, T12, L2, and L4. Electronically Signed   By: Dorethia Molt M.D.   On: 09/26/2024 23:38   ECHOCARDIOGRAM COMPLETE Result Date: 09/16/2024    ECHOCARDIOGRAM REPORT   Patient Name:   Andrew Pacheco   Date of Exam: 09/12/2024 Medical Rec #:  [id number]       Height:       69.0 in Accession #:  [id number]      Weight:       135.0 lb Date of Birth:  [birth year]        BSA:          1.748 m Patient Age:    83 years        BP:           148/83 mmHg Patient Gender: M               HR:           73 bpm. Exam Location:  [address] Procedure: 2D Echo, Cardiac Doppler and Color Doppler (Both Spectral and Color            Flow Doppler were utilized during procedure). Indications:    Near syncope [R55 (ICD-10-CM)]  History:        Patient has prior history of Echocardiogram examinations, most                 recent 09/11/2017.  Sonographer:    Rosaline Fujisawa MHA, RDMS, RVT, RDCS Referring Phys: [id number] ROLLO JONELLE LOUDER  Sonographer Comments: Technically difficult study due to poor echo windows and suboptimal apical window. Image acquisition challenging due to patient body habitus and Image acquisition challenging due to respiratory motion. IMPRESSIONS  1. Left ventricular ejection fraction, by estimation, is 60 to 65%. The left ventricle has normal function. Left ventricular endocardial border not optimally defined to evaluate regional wall motion. There is mild left ventricular hypertrophy. Left ventricular diastolic parameters are indeterminate.  2. Right ventricular systolic function is normal. The right ventricular size is normal. Tricuspid regurgitation signal is inadequate for assessing PA pressure.  3. The mitral valve is degenerative. Trivial mitral valve regurgitation. Though poorly assessed due to image quality, there is likely at least mild mitral stenosis (MG 3 mmHg HR 74 bpm). Severe bulky mitral annular calcification and leaflet  thickening with restricted motion.  4. The aortic valve is abnormal. There is moderate calcification of the aortic valve. There is mild thickening of the aortic valve. Aortic valve regurgitation is moderate. Aortic valve sclerosis/calcification is present, without any evidence of aortic stenosis.  5. The inferior vena cava is normal in size with greater than 50% respiratory variability, suggesting right atrial pressure of 3 mmHg.  6. Imaging findings suggesting dilated abdominal aorta, possibly up to 4 cm. Consider dedicated abdominal aorta imaging if clinicaly indicated. FINDINGS  Left Ventricle: Left ventricular ejection fraction, by estimation, is 60 to 65%. The left ventricle has normal function. Left ventricular endocardial border not optimally defined to evaluate regional wall motion. The left ventricular internal cavity size was normal in size. There is mild left ventricular hypertrophy. Abnormal (paradoxical) septal motion, consistent with left bundle branch block. Left ventricular diastolic parameters are indeterminate. Right Ventricle: The right ventricular size is normal. Right vetricular wall thickness was not well visualized. Right ventricular systolic function is normal. Tricuspid regurgitation signal is inadequate for assessing PA pressure. Left Atrium: Left atrial size was normal in size. Right Atrium: Right atrial size was normal in size. Pericardium: There is no evidence of pericardial effusion. Mitral Valve: The mitral valve is degenerative in appearance. Severe mitral annular calcification. Trivial mitral valve regurgitation. Mild mitral valve stenosis. MV peak gradient, 6.7 mmHg. The mean mitral valve gradient is 3.0 mmHg. Tricuspid Valve: The tricuspid valve is normal in structure. Tricuspid valve regurgitation is trivial. No evidence of tricuspid stenosis. Aortic Valve: The aortic valve is abnormal. There is moderate calcification of  the aortic valve. There is mild thickening of the aortic  valve. Aortic valve regurgitation is moderate. Aortic valve sclerosis/calcification is present, without any evidence of aortic stenosis. Aortic valve mean gradient measures 2.0 mmHg. Aortic valve peak gradient measures 3.9 mmHg. Aortic valve area, by VTI measures 2.33 cm. Pulmonic Valve: The pulmonic valve was normal in structure. Pulmonic valve regurgitation is not visualized. No evidence of pulmonic stenosis. Aorta: The aortic root is normal in size and structure. Venous: The inferior vena cava is normal in size with greater than 50% respiratory variability, suggesting right atrial pressure of 3 mmHg. IAS/Shunts: No atrial level shunt detected by color flow Doppler.  LEFT VENTRICLE PLAX 2D LVIDd:         4.15 cm LVIDs:         2.91 cm LV PW:         1.14 cm LV IVS:        0.96 cm LVOT diam:     1.77 cm LV SV:         40 LV SV Index:   23 LVOT Area:     2.46 cm  IVC IVC diam: 1.77 cm LEFT ATRIUM         Index LA diam:    2.43 cm 1.39 cm/m  AORTIC VALVE AV Area (Vmax):    1.97 cm AV Area (Vmean):   2.03 cm AV Area (VTI):     2.33 cm AV Vmax:           98.70 cm/s AV Vmean:          63.400 cm/s AV VTI:            0.173 m AV Peak Grad:      3.9 mmHg AV Mean Grad:      2.0 mmHg LVOT Vmax:         79.10 cm/s LVOT Vmean:        52.400 cm/s LVOT VTI:          0.164 m LVOT/AV VTI ratio: 0.95  AORTA Ao Root diam: 3.30 cm Ao Asc diam:  3.60 cm MITRAL VALVE MV Area (PHT): 2.03 cm     SHUNTS MV Area VTI:   1.33 cm     Systemic VTI:  0.16 m MV Peak grad:  6.7 mmHg     Systemic Diam: 1.77 cm MV Mean grad:  3.0 mmHg MV Vmax:       1.29 m/s MV Vmean:      74.6 cm/s MV Decel Time: 373 msec MV E velocity: 102.90 cm/s MV A velocity: 140.00 cm/s MV E/A ratio:  0.74 Soyla Merck MD Electronically signed by Soyla Merck MD Signature Date/Time: 09/16/2024/4:35:10 PM    Final    VAS US  CAROTID Result Date: 09/12/2024 Carotid Arterial Duplex Study Patient Name:  Andrew Pacheco  Date of Exam:   09/12/2024 Medical Rec #:  [id number]      Accession #:    [id number] Date of Birth: 1941-09-26       Patient Gender: M Patient Age:   24 years Exam Location:  [address] Procedure:      VAS US  CAROTID Referring Phys: ROLLO LOUDER --------------------------------------------------------------------------------  Indications:  Carotid artery disease and patient presents with complaints of               overall weakness, dizziness and light headiness since the end of               July 2025. He  denies any other cerebrovascular symptoms. Risk Factors: Hypertension, no history of smoking. Performing Technologist: Nanetta Shad RVT  Examination Guidelines: A complete evaluation includes B-mode imaging, spectral Doppler, color Doppler, and power Doppler as needed of all accessible portions of each vessel. Bilateral testing is considered an integral part of a complete examination. Limited examinations for reoccurring indications may be performed as noted.  Right Carotid Findings: +----------+--------+--------+--------+--------------------------+----------+           PSV cm/sEDV cm/sStenosisPlaque Description        Comments   +----------+--------+--------+--------+--------------------------+----------+ CCA Prox  51      7                                                    +----------+--------+--------+--------+--------------------------+----------+ CCA Distal68      17      <50%    heterogenous                         +----------+--------+--------+--------+--------------------------+----------+ ICA Prox  52      14      1-39%   heterogenous                         +----------+--------+--------+--------+--------------------------+----------+ ICA Distal96      31                                        steep dive +----------+--------+--------+--------+--------------------------+----------+ ECA       73      4               heterogenous and irregular            +----------+--------+--------+--------+--------------------------+----------+ +----------+--------+-------+----------------+-------------------+           PSV cm/sEDV cmsDescribe        Arm Pressure (mmHG) +----------+--------+-------+----------------+-------------------+ Dlarojcpjw02      0      Multiphasic, TWO852                 +----------+--------+-------+----------------+-------------------+ +---------+--------+--+--------+--+---------+ VertebralPSV cm/s42EDV cm/s12Antegrade +---------+--------+--+--------+--+---------+  Left Carotid Findings: +----------+--------+--------+--------+-----------------+----------------------+           PSV cm/sEDV cm/sStenosisPlaque           Comments                                                 Description                             +----------+--------+--------+--------+-----------------+----------------------+ CCA Prox  54      11                                                      +----------+--------+--------+--------+-----------------+----------------------+ CCA Distal48      14      <50%    heterogenous                            +----------+--------+--------+--------+-----------------+----------------------+  ICA Prox  53      15      1-39%   heterogenous                            +----------+--------+--------+--------+-----------------+----------------------+ ICA Distal149     37                               tortuous and turbulent +----------+--------+--------+--------+-----------------+----------------------+ ECA       68      7               heterogenous                            +----------+--------+--------+--------+-----------------+----------------------+ +----------+--------+--------+----------------+-------------------+           PSV cm/sEDV cm/sDescribe        Arm Pressure (mmHG) +----------+--------+--------+----------------+-------------------+ Dlarojcpjw35      0        Multiphasic, TWO847                 +----------+--------+--------+----------------+-------------------+ +---------+--------+--+--------+--+---------+ VertebralPSV cm/s42EDV cm/s14Antegrade +---------+--------+--+--------+--+---------+   Summary: Right Carotid: Velocities in the right ICA are consistent with a 1-39% stenosis. Left Carotid: Velocities in the left ICA are consistent with a 1-39% stenosis. Vertebrals:  Bilateral vertebral arteries demonstrate antegrade flow. Subclavians: Normal flow hemodynamics were seen in bilateral subclavian              arteries. *See table(s) above for measurements and observations.  Electronically signed by Dorn Lesches MD on 09/12/2024 at 4:49:34 PM.    Final    ASSESSMENT & PLAN:  83 y.o. 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 Recommended starting a B-complex vitamin. Continue PO Iron.  CMP with Creatinine 2.04 increased from 1.13.   CKD   - Reviewed 10/19/2024 PET scan: 1. Small right hilar and lower paratracheal lymph nodes with moderate metabolic activity, potentially reactive or neoplastic.  2. Diffuse increased skeletal metabolic activity without focal lesion, suggestive of a systemic process.  3. Systemic atherosclerosis.  4. Mild subpleural reticulation and mild anterior scarring along the minor fissure.  5. Sigmoid diverticulosis.  6. Mild grade 1 anterolisthesis at L5-S1.  7. Cholelithiasis   - Reviewed 10/12/2024 Flow Path: Monocytes constitute 33% of all cells. Less than 1% CD34 positive blasts by flow cytometry Plasma cells represent approximately 2% of cells and are unable to be  characterized for light chains.  - Reviewed 10/12/2024 Bone Marrow Report: Hypercellular bone marrow secondary to a myeloid hyperplasia of both the neutrophilic and monocytic lineages as well as mildly increased blasts approximately 5% Mild plasmacytosis with apparent kappa predominance and aberrant cyclin D1 staining Normochromic normocytic anemia Monocytosis Mild morphologic evidence of dysgranulopoiesis suggestive of possible dysplasia Explained MGUS could progress to active multiple myeloma, but this will just be monitored for now.  - Explained what CMML is and how it affects the body, how it's staged, and treatment options.  Once he's started on treatment, we'd monitor his response after about 6 months.  Even if we don't treat his CMML  directly, we could still provide supporting treatment to manage his symptoms.  - Ordering Orders Placed This Encounter  Procedures  . CBC with Differential (Cancer Center Only)  . Lactate dehydrogenase  . Haptoglobin  . Multiple Myeloma Panel (SPEP&IFE w/QIG)  . JAK2 (including V617F and Exon 12), MPL, and CALR-Next Generation Sequencing  . Myeloid NGS  . BCR-ABL1 FISH  . TSH  . T4, free  . Testosterone , free, total  . Erythropoietin   . Vitamin D  25 hydroxy  . Retic Panel  . Sample to Blood Bank   FOLLOW-UP in *** with Dr. Onesimo.  The total time spent in the appointment was *** minutes* . All of the patient's questions were answered and the patient knows to call the clinic with any problems, questions, or concerns.  Emaline Onesimo MD MS AAHIVMS [facility] Hematology/Oncology Physician [facility]  *Total Encounter Time as defined by the Centers for Medicare and Medicaid Services includes, in addition to the face-to-face time of a patient visit (documented in the note above) non-face-to-face time: obtaining and reviewing outside history, ordering and reviewing medications, tests or procedures, care coordination (communications with  other health care professionals or caregivers) and documentation in the medical record.  I,Emily Lagle,acting as a neurosurgeon for Emaline Onesimo, MD.,have documented all relevant documentation on the behalf of Emaline Onesimo, MD,as directed by  Emaline Onesimo, MD while in the presence of Emaline Onesimo, MD.  I have reviewed the above documentation for accuracy and completeness, and I agree with the above.  Destry Dauber, MD

## 2024-11-09 ENCOUNTER — Encounter (HOSPITAL_COMMUNITY): Payer: Self-pay | Admitting: Hematology and Oncology

## 2024-11-13 LAB — MISC LABCORP TEST (SEND OUT): Labcorp test code: 489555

## 2024-11-20 LAB — MYELOID NGS

## 2024-12-25 ENCOUNTER — Ambulatory Visit: Attending: Cardiology | Admitting: Cardiology

## 2024-12-25 ENCOUNTER — Encounter: Payer: Self-pay | Admitting: Cardiology

## 2024-12-25 VITALS — BP 137/74 | HR 65 | Resp 16 | Ht 71.0 in | Wt 137.4 lb

## 2024-12-25 DIAGNOSIS — R072 Precordial pain: Secondary | ICD-10-CM | POA: Diagnosis not present

## 2024-12-25 DIAGNOSIS — C9210 Chronic myeloid leukemia, BCR/ABL-positive, not having achieved remission: Secondary | ICD-10-CM

## 2024-12-25 DIAGNOSIS — R002 Palpitations: Secondary | ICD-10-CM

## 2024-12-25 NOTE — Patient Instructions (Addendum)
 Medication Instructions:  Your physician recommends that you continue on your current medications as directed. Please refer to the Current Medication list given to you today.   *If you need a refill on your cardiac medications before your next appointment, please call your pharmacy*   Follow-Up: At [facility], you and your health needs are our priority.  As part of our continuing mission to provide you with exceptional heart care, our providers are all part of one team.  This team includes your primary Cardiologist (physician) and Advanced Practice Providers or APPs (Physician Assistants and Nurse Practitioners) who all work together to provide you with the care you need, when you need it.  Your next appointment:    As Needed   Provider:   Gordy Bergamo, MD       We recommend signing up for the patient portal called MyChart.  Patients are able to view lab/test results, encounter notes, upcoming appointments, etc.  Non-urgent messages can be sent to your provider as well, go to [email or web address].

## 2024-12-25 NOTE — Progress Notes (Signed)
 " Cardiology Office Note:  .   Date:  12/25/2024  ID:  Andrew Pacheco, DOB [birth year], [name] [id number] PCP: Yolande Toribio MATSU, MD  [facility] Providers Cardiologist:  Gordy Bergamo, MD   History of Present Illness: .   Andrew Pacheco is a 84 y.o. Caucasian male patient with no significant cardiovascular history presents to discuss palpitations, weight loss, fatigue, new diagnosis of CML and therapy options.  He is accompanied by his wife.  He was evaluated for near syncope felt to be vasovagal and evaluated by carotid duplex on 09/12/2024 which revealed very mild 1-39% bilateral ICA stenosis with mild plaque, event monitor showing very brief A-fib episode for 3 minutes but otherwise frequent PVCs at 5.7% burden and ventricular bigeminy and trigeminy which was symptomatic.    He has noticed lack of energy.  Denies exertional chest pain or dyspnea.  No PND or orthopnea or leg edema    Discussed the use of AI scribe software for clinical note transcription with the patient, who gave verbal consent to proceed.  History of Present Illness Andrew Pacheco is an 84 year old male with chronic myelomonocytic leukemia (CMML) who presents with fatigue and palpitations. He was referred by Dr. Kalei for evaluation of his heart condition and potential treatment options.  In late October, he underwent evaluation for abnormal blood tests and was diagnosed with CMML.  He has marked fatigue and diffuse body aches, with about 20 pounds of weight loss over six months that has recently stabilized. He eats as much as he can but still feels weak.  He has recurrent episodes where his chest feels like it goes squoosh, with associated palpitations. Ambulatory monitoring showed predominantly PACs and PVCs with one more significant episode. Palpitations occur at rest and with exertion, more often at rest. He has had near-syncope, especially during a bone marrow biopsy, but no true loss of consciousness.  His hands and  feet are persistently cold and his fingertips often become numb.  He is not taking any cardiac medications.  Cardiac Studies relevent.    Zio patch monitoring 7 days for syncope and collapse starting 09/05/2024: Predominant underlying rhythm is sinus rhythm, minimum heart rate 54 bpm, maximum heart rate 142 bpm with average heart rate of 78 bpm. First-degree AV block was present. Atrial fibrillation (1% burden) with RVR, heart rate ranging from 91 to 169 bpm, longest lasting 3 minutes. Occasional PACs, rare atrial couplets and triplets were present. Frequent isolated PVCs (burden 5.7%). Ventricular bigeminy (5.1 seconds) and ventricular trigeminy (40 seconds) were present. Patient's symptomatic events correlated with PVCs.     Atrial fibrillation:   ECHOCARDIOGRAM COMPLETE 09/12/2024 1. Left ventricular ejection fraction, by estimation, is 60 to 65%. The left ventricle has normal function. Left ventricular endocardial border not optimally defined to evaluate regional wall motion. There is mild left ventricular hypertrophy. Left ventricular diastolic parameters are indeterminate. 2. Right ventricular systolic function is normal. The right ventricular size is normal. Tricuspid regurgitation signal is inadequate for assessing PA pressure. 3. The mitral valve is degenerative. Trivial mitral valve regurgitation. Though poorly assessed due to image quality, there is likely at least mild mitral stenosis (MG 3 mmHg HR 74 bpm). Severe bulky mitral annular calcification and leaflet thickening with restricted motion. 4. The aortic valve is abnormal. There is moderate calcification of the aortic valve. There is mild thickening of the aortic valve. Aortic valve regurgitation is moderate. Aortic valve sclerosis/calcification is present, without any evidence of aortic stenosis.  5. The inferior vena cava is normal in size with greater than 50% respiratory variability, suggesting right atrial pressure of 3  mmHg. 6. Imaging findings suggesting dilated abdominal aorta, possibly up to 4 cm. Consider dedicated abdominal aorta imaging if clinicaly indicated.  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 Mid and distal SMA demonstrate waveforms suggesting   EKG:  CERMSGREFRESH([id number],,,1)@    EKG 12/25/2024: Normal sinus rhythm at rate of 81 bpm, right bundle branch block.  No evidence of ischemia.  No significant change compared to 09/05/2024.   Labs   Lab Results  Component Value Date   CHOL 139 09/11/2017   HDL 37 (L) 09/11/2017   LDLCALC 97 09/11/2017   TRIG 27 09/11/2017   CHOLHDL 3.8 09/11/2017   No results found for: LIPOA  Recent Labs    08/29/24 1217  NA 137  K 4.8  CL 103  CO2 26  GLUCOSE 82  BUN 37*  CREATININE 2.04*  CALCIUM  9.5  GFRNONAA 31*    Lab Results  Component Value Date   ALT 8 08/29/2024   AST 14 (L) 08/29/2024   ALKPHOS 71 08/29/2024   BILITOT 0.4 08/29/2024      Latest Ref Rng & Units 11/01/2024    3:12 PM 10/12/2024    7:30 AM 09/13/2024    3:27 PM  CBC  WBC 4.0 - 10.5 K/uL 4.9  6.5  6.2   Hemoglobin 13.0 - 17.0 g/dL 9.3  9.1  9.8   Hematocrit 39.0 - 52.0 % 28.2  29.7  29.5   Platelets 150 - 400 K/uL 188  192  180    Lab Results  Component Value Date   HGBA1C 5.6 09/11/2017    Lab Results  Component Value Date   TSH 2.200 11/01/2024     ROS  Review of Systems  Constitutional: Positive for malaise/fatigue.  Cardiovascular:  Positive for irregular heartbeat and palpitations. Negative for chest pain, dyspnea on exertion and leg swelling.   Physical Exam:   VS:  BP 137/74 (BP Location: Left Arm, Patient Position: Sitting, Cuff Size: Normal)   Pulse 65   Resp 16   Ht 5' 11 (1.803 m)   Wt 137 lb 6.4 oz (62.3 kg)   SpO2 94%   BMI 19.16 kg/m     Wt Readings from Last 3 Encounters:  12/25/24 137 lb 6.4 oz (62.3 kg)  11/01/24 134 lb 6.4 oz (61 kg)  10/04/24 132 lb 3.2 oz (60 kg)    BP Readings from Last 3 Encounters:  12/25/24 137/74  11/01/24 (!) 141/75  10/12/24 136/81   Physical Exam  ASSESSMENT AND PLAN: .      ICD-10-CM   1. Precordial pain  R07.2 EKG 12-Lead    2. Palpitations  R00.2 EKG 12-Lead    3. 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 Symptoms include significant fatigue, weight loss, and weakness, attributed to the leukemia. The condition is life-altering but not life-threatening. CML typically responds well to chemotherapy, and the prognosis is generally favorable. He is aware of the potential for experimental treatments and is open to trying them if beneficial, but is also  prepared to discontinue if they cause significant discomfort without benefit. - Proceed with consultation with Dr. Onesimo for treatment options. - Encouraged participation in MyChart for better communication with healthcare providers. - Reviewed hematologic consult notes.  Premature ventricular contractions His symptoms of chest discomfort are in fact palpitations mostly occurring at rest and not with exertional activity.  Event monitor again personally reviewed. Episodes of premature ventricular contractions (PVCs) primarily at rest, not life-threatening but life-altering. PVCs are common and can be influenced by stress, lack of sleep, alcohol , and caffeine, though none apply to him. Heart function is normal, and EKG shows a right bundle branch block, which is a normal variant. No immediate cardiac concerns. - Encouraged physical activity to assess PVCs during exertion. - Reassured that PVCs are not life-threatening and can be managed symptomatically if  needed. - No contraindication from cardiac standpoint for management of CML and happy to coordinate and assist with management if cardiac issues were to arise.  Right bundle branch block Noted on EKG, considered a normal variant. No associated cardiac symptoms or concerns. - No specific intervention required for right bundle branch block.  Long discussion with the patient and his wife at the bedside will also follow and she is my patient regarding end-of-life issues, willingness to try chemotherapeutic agents or immuno therapy, what ever it may take to prolong his life and also yet keep comfort in mind.  Patient is very open to all discussions and he would like to pursue therapy options as per hematology.  Patient was very thankful regarding my reassurance given to him regarding his cardiac status.  Advised him to increase his physical activity as tolerated, fatigue probably related to his underlying CML.  No clinical evidence of heart failure.  Follow up: As needed  Signed,  Gordy Bergamo, MD, [facility] 12/25/2024, 4:35 PM [facility] [address] [place], [place] [address] Phone: [phone]. Fax:  [phone]  "

## 2025-01-04 ENCOUNTER — Inpatient Hospital Stay: Admitting: Hematology

## 2025-01-04 ENCOUNTER — Inpatient Hospital Stay: Attending: Hematology and Oncology

## 2025-01-04 VITALS — BP 135/75 | HR 88 | Temp 98.1°F | Resp 16 | Wt 138.6 lb

## 2025-01-04 DIAGNOSIS — D649 Anemia, unspecified: Secondary | ICD-10-CM

## 2025-01-04 DIAGNOSIS — C9310 Chronic myelomonocytic leukemia not having achieved remission: Secondary | ICD-10-CM

## 2025-01-04 DIAGNOSIS — D472 Monoclonal gammopathy: Secondary | ICD-10-CM | POA: Diagnosis not present

## 2025-01-04 LAB — CBC WITH DIFFERENTIAL/PLATELET
Abs Immature Granulocytes: 0.09 K/uL — ABNORMAL HIGH (ref 0.00–0.07)
Basophils Absolute: 0 K/uL (ref 0.0–0.1)
Basophils Relative: 0 %
Eosinophils Absolute: 0 K/uL (ref 0.0–0.5)
Eosinophils Relative: 0 %
HCT: 26.7 % — ABNORMAL LOW (ref 39.0–52.0)
Hemoglobin: 8.8 g/dL — ABNORMAL LOW (ref 13.0–17.0)
Immature Granulocytes: 1 %
Lymphocytes Relative: 18 %
Lymphs Abs: 1.3 K/uL (ref 0.7–4.0)
MCH: 30.4 pg (ref 26.0–34.0)
MCHC: 33 g/dL (ref 30.0–36.0)
MCV: 92.4 fL (ref 80.0–100.0)
Monocytes Absolute: 4.5 K/uL — ABNORMAL HIGH (ref 0.1–1.0)
Monocytes Relative: 63 %
Neutro Abs: 1.3 K/uL — ABNORMAL LOW (ref 1.7–7.7)
Neutrophils Relative %: 18 %
Platelets: 180 K/uL (ref 150–400)
RBC: 2.89 MIL/uL — ABNORMAL LOW (ref 4.22–5.81)
RDW: 14.1 % (ref 11.5–15.5)
WBC: 7.1 K/uL (ref 4.0–10.5)
nRBC: 0 % (ref 0.0–0.2)

## 2025-01-04 NOTE — Progress Notes (Signed)
 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   HISTORY OF PRESENTING ILLNESS: Andrew Pacheco is a wonderful 84 y.o. male who has been referred to us  by Dr. 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 accompanied by wife.    He says that his PCP, Dr. Yolande has been monitoring his anemia for two years, and he has been instructed to take a prenatal vitamin, but after no improvement and worsening symptoms referred to oncology for evaluation.   He says that three years ago, he tripped and crushed his right hip and broke his femur. Subsequently underwent a full hip replacement. At that time weighed 180 LBS, currently 130 LBS for a total of 20 LBS over two years. Gradually his appetite and energy levels diminished. Endorses occasionally extreme fatigue or persisting mild fatigue.  His weight loss has improved within the past couple of months. Does occasionally use Ensure for nutritional supplementation.   He also has a history of bleeding ulcers and there was an incident several years ago where he was admitted to the hospital for severe anemia. From this incident, his wife notes that he's never returned to baseline.    Also was recently diagnosed with Afib, but has not been placed on medication due to concern of syncope. During Afib attacks, he is extremely weak and fatigued.   Wife notes that he has never laid down during the day, but more recently over the years has been occurring more frequently.     He denies a new cough, respiratory infections, SOB, other illnesses, bleeding issues, abdominal distention, leg swelling, joint pain or swelling. Denies history of prostate issues.  He does note that he experiences abdominal fullness after eating. Also, he does reportedly experience back pain due to an injury. He was not recommended to have surgery for this, so is instead being managed with medication.   He says that his vision and hearing has been degrading.    SUMMARY OF ONCOLOGIC HISTORY: Oncology History   No problem history exists.    INTERVAL HISTORY: Andrew Pacheco is a 84 y.o. 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 He is accompanied by his wife today.  he was last seen by me on 11/01/2024; at the time he mentioned experiencing gradual appetite suppression and worsening fatugye in the past two years. He has a fall over three years ago and shattered his rt hip and broke his femur, which resulted in a full hip replacement. He noted a 30-40 lb weight loss since the surgery, over the course of three years.   Today, he reports some increased fatigue. He reports he is eating well. He is happy with his quality of life other than his fatigue, which has increased in the last 2 years. He is concerned that starting treatment may worsen his quality of life due to treatment side effects.    He denies any hx of prostate cancer, but sometimes experiences urinary incontinence. He is not currently taking Flomax or any prostate/bladder relaxer medication to  help his urinary incontinence.    REVIEW OF SYSTEMS:   10 Point review of systems of done and is negative except as noted above.  MEDICAL HISTORY Past Medical History:  Diagnosis Date   Anemia    Arthritis    both hips, back (09/09/2017)   Chronic back pain    middle and lower back (09/09/2017)   Colon  polyps    Daily headache    Diverticulosis    GERD (gastroesophageal reflux disease)    Glaucoma, both eyes    H pylori ulcer    Hard of hearing    Insomnia    due to disc   Lumbar disc disease    Migraine    q 3-4 months (09/09/2017)   Seasonal allergies     SURGICAL HISTORY Past Surgical History:  Procedure Laterality Date   APPENDECTOMY     CYST EXCISION Right ~ 1971   knee   ESOPHAGOGASTRODUODENOSCOPY N/A 01/15/2018   Procedure: ESOPHAGOGASTRODUODENOSCOPY (EGD);  Surgeon: Aneita Gwendlyn DASEN, MD;  Location: [facility] ENDOSCOPY;  Service: Endoscopy;  Laterality: N/A;   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   Procedure: TOTAL HIP ARTHROPLASTY ANTERIOR APPROACH;  Surgeon: Fidel Rogue, MD;  Location: [facility] ORS;  Service: Orthopedics;  Laterality: Right;    SOCIAL HISTORY Social History[1]  Social History   Social History Narrative   Not on file    SOCIAL DRIVERS OF HEALTH SDOH Screenings   Food Insecurity: No Food Insecurity (11/01/2024)  Housing: Low Risk (11/01/2024)  Transportation Needs: No Transportation Needs (11/01/2024)  Utilities: Not At Risk (11/01/2024)  Depression (PHQ2-9): Low Risk (11/01/2024)  Tobacco Use: Low Risk (12/25/2024)     FAMILY HISTORY Family History  Problem Relation Age of Onset   Heart disease Father    Emphysema Father    Diverticulosis Father    Cancer Mother        gallbladder-mets   Arthritis Mother    Liver cancer Mother      ALLERGIES: has no known allergies.  MEDICATIONS  Current Outpatient Medications  Medication Sig Dispense Refill   COMBIGAN  0.2-0.5 % ophthalmic solution Place 1 drop into both eyes 2 (two) times daily.  2   cyclobenzaprine (FLEXERIL) 10 MG tablet Take 10 mg by mouth as needed for muscle spasms.     diazepam  (VALIUM ) 5 MG tablet Take one tablet by mouth with food one hour prior to procedure. May repeat 30 minutes prior if needed. 2 tablet 0   feeding supplement, ENSURE ENLIVE, (ENSURE ENLIVE) LIQD Take  237 mLs by mouth 2 (two) times daily between meals. 237 mL 0   HYDROcodone -acetaminophen  (NORCO) 7.5-325 MG tablet Take 1-2 tablets by mouth every 4 (four) hours as needed for severe pain (pain score 7-10). 30 tablet 0   ipratropium (ATROVENT ) 0.06 % nasal spray Place 2 sprays into both nostrils as needed for rhinitis.     pantoprazole  (PROTONIX ) 40 MG tablet Take 1 tablet (40 mg total) by mouth daily. Office visit for further refills 90 tablet 0   polyethylene glycol (MIRALAX  / GLYCOLAX ) 17 g packet Take 17 g by mouth daily. 14 each 0   Prenatal Vit-Fe Fumarate-FA (PRENATAL VITAMINS) 28-0.8 MG TABS Take 1 tablet by mouth daily.  12   senna-docusate (SENOKOT-S) 8.6-50 MG tablet Take 2 tablets by mouth 2 (two) times daily.     travoprost , benzalkonium, (TRAVATAN ) 0.004 % ophthalmic solution Place 1 drop into both eyes at bedtime.     zolpidem  (AMBIEN ) 10 MG tablet Take 1  tablet (10 mg total) by mouth at bedtime as needed for sleep. 15 tablet 0   No current facility-administered medications for this visit.    PHYSICAL EXAMINATION: ECOG PERFORMANCE STATUS: 1 - Symptomatic but completely ambulatory VITALS: Vitals:   01/04/25 1158  BP: 135/75  Pulse: 88  Resp: 16  Temp: 98.1 F (36.7 C)  SpO2: 100%   Filed Weights   01/04/25 1158  Weight: 138 lb 9.6 oz (62.9 kg)   Body mass index is 19.33 kg/m.  GENERAL: alert, in no acute distress and comfortable SKIN: no acute rashes, no significant lesions EYES: conjunctiva are pink and non-injected, sclera anicteric OROPHARYNX: MMM, no exudates, no oropharyngeal erythema or ulceration NECK: supple, no JVD LYMPH:  no palpable lymphadenopathy in the cervical, axillary or inguinal regions LUNGS: clear to auscultation b/l with normal respiratory effort HEART: regular rate & rhythm ABDOMEN:  normoactive bowel sounds , non tender, not distended, no hepatosplenomegaly Extremity: no pedal edema PSYCH: alert & oriented x 3 with fluent speech NEURO:  no focal motor/sensory deficits  LABORATORY DATA:   I have reviewed the data as listed     Latest Ref Rng & Units 01/04/2025   11:26 AM 11/01/2024    3:12 PM 11/01/2024    3:11 PM  CBC EXTENDED  WBC 4.0 - 10.5 K/uL 7.1  4.9    RBC 4.22 - 5.81 MIL/uL 2.89  3.03  3.10   Hemoglobin 13.0 - 17.0 g/dL 8.8  9.3    HCT 60.9 - 52.0 % 26.7  28.2    Platelets 150 - 400 K/uL 180  188    NEUT# 1.7 - 7.7 K/uL 1.3  1.2    Lymph# 0.7 - 4.0 K/uL 1.3  1.2         Latest Ref Rng & Units 08/29/2024   12:17 PM 07/13/2021    2:57 AM 07/12/2021    3:08 AM  CMP  Glucose 70 - 99 mg/dL 82  97  898   BUN 8 - 23 mg/dL 37  24  27   Creatinine 0.61 - 1.24 mg/dL 7.95  8.86  8.71   Sodium 135 - 145 mmol/L 137  137  138   Potassium 3.5 - 5.1 mmol/L 4.8  4.8  4.8   Chloride 98 - 111 mmol/L 103  105  105   CO2 22 - 32 mmol/L 26  25  25    Calcium  8.9 - 10.3 mg/dL 9.5  8.7  8.6   Total Protein 6.5 - 8.1 g/dL 8.2     Total Bilirubin 0.0 - 1.2 mg/dL 0.4     Alkaline Phos 38 - 126 U/L 71     AST 15 - 41 U/L 14     ALT 0 - 44 U/L 8       Molcular Pathology 11/09/24     Surgical Pathology 10/12/2024  CASE: [id number] PATIENT: Andrew Pacheco Flow Pathology Report  Clinical history: IgA monoclonal gammopathy  DIAGNOSIS:  Bone marrow, aspirate, flow cytometry: -  Monocytes constitute 33% of all cells. -  Less than 1% CD34 positive blasts by flow cytometry -  Plasma cells represent approximately 2% of cells and are unable to be characterized for light chains.  GATING AND PHENOTYPIC ANALYSIS:  Gated population: Flow cytometric immunophenotyping is performed using antibodies to the antigens listed in the table below. Electronic gates are placed around a cell cluster displaying light scatter properties corresponding to: lymphocytes and monocytes  Abnormal Cells in gated population: N/A  Phenotype  of Abnormal Cells: N/A                      Lymphoid Antigens       Myeloid  Antigens Miscellaneous CD2  tested    CD10 tested    CD11b     tested    CD45 tested CD3  tested    CD19 tested    CD11c     ND   HLA-Dr    tested CD4  tested    CD20 tested    CD13 tested    CD34 tested CD5  tested    CD22 ND   CD14 tested    CD38 tested CD7  tested    CD79b     ND   CD15 tested    CD138     ND CD8  tested    CD103     ND   CD16 tested    TdT  ND CD25 ND   CD200     tested    CD33 tested    CD123     tested TCRab     ND   sKappa    tested    CD64 tested    CD41 ND TCRgd     tested    sLambda   tested    CD117     tested    CD61 ND CD56 tested    cKappa    ND   MPO  ND   CD71 ND CD57 ND   cLambda   ND             CD235a    ND  GROSS DESCRIPTION:  Reference Bone Marrow case [id number].    Surgical Pathology 10/12/2024  CASE: [id number] PATIENT: Andrew Pacheco Bone Marrow Report     Clinical History: IgA monoclonal gammopathy     DIAGNOSIS:  BONE MARROW, ASPIRATE, CLOT, CORE: - Hypercellular bone marrow secondary to a myeloid hyperplasia of both the neutrophilic and monocytic lineages as well as mildly increased blasts approximately 5% - Mild plasmacytosis with apparent kappa predominance and aberrant cyclin D1 staining - See note  PERIPHERAL BLOOD: - Normochromic normocytic anemia - Monocytosis - Mild morphologic evidence of dysgranulopoiesis suggestive of possible dysplasia  Note: It is noted the patient has an IgA kappa restricted paraprotein. The plasma cells are mildly increased at approximately 7% and appear to show cyclin D1 aberrant expression as well as an apparent kappa predominance by kappa/lambda-ISH (evaluation slightly hindered due to weak staining).  Given the concurrence of the kappa M protein and kappa predominance in the bone marrow by ISH as well as the apparent aberrant cyclin D1 staining on the plasma cells the findings are consistent with a plasma cell neoplasm at approximately 7% of the cellular marrow. Clinical correlation  with pending plasma cell myeloma FISH is recommended.  In addition to the aforementioned plasmacytosis there is also a hypercellularity of the bone marrow with a prominent myeloid hyperplasia and erythroid hypoplasia.  This myeloid hyperplasia is secondary to a mixed neutrophilic and monocytic hyperplasia with mild left shift with blasts and approximately 5% by IHC.  This is in the presence of a peripheral blood monocytosis with findings suggestive of but not pathognomonic for dysgranulopoiesis/neutrophilic dysplasia.  It is noted that the monocytosis is present for at least a month, but was not present in July (borderline at 0.9).  The findings are highly concerning for a possible primary bone marrow process such as chronic myelomonocytic leukemia (CMML).  Clinical correlation with pending cytogenetics and myeloid NexGen sequencing is recommended for confirmatory purposes.  MICROSCOPIC DESCRIPTION:  PERIPHERAL BLOOD SMEAR: The peripheral blood smear and indices are reviewed revealing a normochromic normocytic anemia.  There is also a monocytosis.  Upon morphologic smear review the monocytosis is noted. There is abundant toxic vacuolization present; however, morphologically with a are unremarkable.  The neutrophils appear to show a mild degree of hyper granularity and equivocal abnormal nuclear clumping.  The white blood cells are morphologically unremarkable.  The platelets show few scattered large/giant platelets but otherwise unremarkable.  BONE MARROW ASPIRATE: The bone marrow aspirate smear slides contain several cellular bone marrow spicules which are adequate for interpretation. Erythroid precursors: The erythroid series is present in significantly decreased number (ME ratio 14.5).  Of the nucleated red blood cells present there is some degree of nuclear irregularity, but definitive evidence of dysplasia on a limited number of evaluable red blood cells is not  present. Granulocytic precursors: There is a significant myeloid hyperplasia that consists of both a hyperplasia in both the neutrophilic and monocytic lineages with left shift.  There is also apparent megaloblastoid type change present in the neutrophilic precursors with large/giant metamyelocytes present there is equivocal abnormal nuclear clumping present suggestive of an element of dysplasia.  Blasts are present at 2% Megakaryocytes: The megakaryocytes are present in adequate number with overall unremarkable morphology. Lymphocytes/plasma cells: Plasma cells are mildly increased at 7%.  TOUCH PREPARATIONS: The touch prep shows similar but suboptimal morphology.  The aspirate smear slides, refer to above.  CLOT AND BIOPSY: The decalcified bone marrow biopsy consists of a core of cortical and trabecular bone with hypercellular hematopoietic marrow which is adequate for interpretation..  The cellularity is approximately 75 to 80% secondary to a myeloid hyperplasia with left shift, but with maturation.  Megakaryocytes are mildly increased some with hyperchromatic forms.  The clot section shows similar morphology to the biopsy, refer to above  IMMUNOHISTOCHEMICAL STAINS: Immunohistochemical stains are performed on both the biopsy and clot section with appropriate controls.  CD34 highlights mildly increased blasts and approximately 5%.  In addition CD34 is aberrantly staining on the megakaryocytes.  CD117 highlights mildly increased mononuclear cells.  They do not appear to correspond to the E-cadherin positive immature erythroid precursors that they appear overall decreased.  They could represent either immature myeloid precursors or plasma cells.  A myeloperoxidase stain highlights the myeloid hyperplasia.  A CD138/MUM1 stain highlights mildly increased plasma cells (5% but within otherwise normal immuno architecture.  CD56 appears negative in the plasma cells.  Cyclin D1 is  interpreted as positive in the plasma cells.  CD117 appears positive in the immature myeloid precursors predominantly in a paratrabecular cuffing fashion; however this some of the plasma cells may also be positive but is difficult to ascertain given the limited number of plasma cells in comparison to myeloid cells. There is an apparent kappa predominance by kappa/lambda ISH, but the interpretation is slightly hindered given weak staining.  IRON STAIN: Iron stains are performed on a bone marrow aspirate or touch imprint smear and section of clot. The controls stained appropriately.       Storage Iron: Adequate histiocytic iron stores      Ring Sideroblasts: Not identified in a limited number of immature erythroid precursors  ADDITIONAL DATA/TESTING: Conventional cytogenetics, plasma cell prognostic panel FISH as well as myeloid NexGen sequencing are pending and will be reported separately.  CELL COUNT DATA:  Bone Marrow count performed on 500 cells shows:  Blasts/Other:  2%   Myeloid:  58% Promyelocytes: 2%   Erythroid:     4% Myelocytes:    2%   Lymphocytes:   6% Metamyelocytes:     0%   Plasma cells:  7% Bands:    1% Neutrophils:   51%  M:E ratio:     14.5 Eosinophils:   0% Basophils:     0% Monocytes:     25%  Lab Data: CBC performed on 10/12/2024 shows: WBC: 6.5 k/uL  Neutrophils:   19% Hgb: 9.1 g/dL  Lymphocytes:   63% HCT: 29.7 %    Monocytes:     45% MCV: 96.7 fL   Eosinophils:   0% RDW: 13.6 %    Basophils:     0% PLT: 192 k/uL    GROSS DESCRIPTION:  A. BM-aspirate smear  B. Received in B-plus fixative is a 2.5 x 1.0 x 0.2 cm aggregate of tissue.  Submitted entirely in B1.  C.  Received in B-plus fixative are 3 cores of firm bone ranging from 0.4-1.5 cm in length and each measuring 0.2 cm in diameter.  Submitted entirely in C1, following decalcification in Immunocal. (WC 10/12/2024)   Final Diagnosis performed by Mark LeGolvan DO.   Electronically  signed 10/17/2024 Technical and / or Professional components performed at [facility], [address] [place], [place] [address].  Immunohistochemistry Technical component (if applicable) was performed at [facility]. [address], [address], [place], [place] [address].   IMMUNOHISTOCHEMISTRY DISCLAIMER (if applicable): Some of these immunohistochemical stains may have been developed and the performance characteristics determine by [facility]. Some may not have been cleared or approved by the U.S. Food and Drug Administration. The FDA has determined that such clearance or approval is not necessary. This test is used for clinical purposes. It should not be regarded as investigational or for research. This laboratory is certified under the Clinical Laboratory Improvement Amendments of 1988 (CLIA-88) as qualified to perform high complexity clinical laboratory testing.  The controls stained appropriately.   IHC stains are performed on formalin fixed, paraffin embedded tissue using a 3,3diaminobenzidine (DAB) chromogen and Leica Bond Autostainer System. The staining intensity of the nucleus is score manually and is reported as the percentage of tumor cell nuclei demonstrating specific nuclear staining. The specimens are fixed in 10% Neutral Formalin for at least 6 hours and up to 72hrs. These tests are validated on decalcified tissue. Results should be interpreted with caution given the possibility of false negative results on decalcified specimens. Antibody Clones are as follows ER-clone 50F, PR-clone 16, Ki67- clone MM1. Some of these immunohistochemical stains may have been developed and the performance characteristics determined by [facility] Pathology.         10/12/2024    CBC      RADIOGRAPHIC STUDIES: I have personally reviewed the radiological images as listed and agreed with the findings in the report. NM PET Image  Initial (PI) Skull Base To Thigh Result Date: 10/19/2024 EXAM: PET AND CT SKULL BASE TO MID THIGH 10/18/2024 03:16:19 PM TECHNIQUE: RADIOPHARMACEUTICAL: 6.42 mCi F-18 FDG Uptake time 60 minutes. Glucose level 99 mg/dl. Blood pool SUV 2.0, liver parenchyma SUV 2.9. PET imaging was acquired from the base of the skull to the mid thighs. Non-contrast enhanced computed tomography was obtained for attenuation correction and anatomic localization. COMPARISON: None available. CLINICAL HISTORY: Unexplained weight loss, looking for occult malignancy. Monoclonal gammopathy of unknown significance. FINDINGS: HEAD AND NECK: No metabolically active  cervical lymphadenopathy. Bilateral common carotid atheromatous vascular calcifications. CHEST: No metabolically active pulmonary nodules. Pro right hilar lymph nodes are observed with maximum SUV 3.8. 1.0 cm lower paratracheal node anterior to the carina on image 80 series 4 with maximum SUV 3.0. Mild anterior scarring along the minor fissure. Mild subpleural reticulation in the lungs, nonspecific. Chest atheroma. ABDOMEN AND PELVIS: No metabolically active intraperitoneal mass. No metabolically active lymphadenopathy. Physiologic activity within the gastrointestinal and genitourinary systems. Small gallstones noted. Systemic atherosclerosis is present, including the aorta and iliac arteries. A predominant abdominal aortic caliber. Sigmoid colon diverticulosis. BONES AND SOFT TISSUE: Excess portal and accentuated metabolic activity in the skeleton. No focal skeletal lesion identified. No metabolically active aggressive osseous lesion. Right total hip prosthesis. Mild grade 1 anterolisthesis at L5-S1. IMPRESSION: 1. Small right hilar and lower paratracheal lymph nodes with moderate metabolic activity, potentially reactive or neoplastic. 2. Diffuse increased skeletal metabolic activity without focal lesion, suggestive of a systemic process. 3. Systemic atherosclerosis. 4. Mild  subpleural reticulation and mild anterior scarring along the minor fissure. 5. Sigmoid diverticulosis. 6. Mild grade 1 anterolisthesis at L5-S1. 7. Cholelithiasis Electronically signed by: Ryan Salvage MD 10/19/2024 01:01 PM EDT RP Workstation: [id number]    ASSESSMENT & PLAN:  84 y.o. 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 MGUS (monoclonal gammopathy of unknown significance)  PLAN: - Discussed lab results on 01/04/2025 in detail with patient: -CBC shows some mild anemia -Hgb 8.8, which could be causing his fatigue  -Erythropoietin  levels were low at 7.6, discussed erythropoietin  shot that may improve anemia symptoms  -replacing testosterone  may help with anemia and symtpoms  -discussed MGUS finding and enlarged WBC and and increase in monocytes, 5% increase in blasts, CMML1  -negative for JAK2 mutation on genetic testing -molecular study showed some high risk mutations: SRSF2 mutation, IDH2 . TET2 mutations. -discussed more involved treatment, Vidaza, 5 days successively once a month, 3 weeks off  -discussed that Vidaza suppresses blasts and targeting stem cells to reprogram the way these cells behave, which is different from traditional chemotherapy, typically administered via IV, but can receive treatment via injection -Discussed the risk of starting testosterone  replacement with the possibility of an already enlarged prostate -Pt decided to move forwards with erythropoietin  injections to increase RBC counts, discussed that sometimes this can cause BP increase and blood clots, but this is not currently a concern  -we will continue to monitor blood counts while he is on erythropoietin  injections (3 months of injections to begin) and we will adjust injection doses as need -will start injections in the next week or two Aranesp every 4 weeks with labs x 4..starting within 1 week MD visit in 8 weeks  FOLLOW-UP   Aranesp every 4 weeks with  labs x 4..starting within 1 week MD visit in 8 weeks    The total time spent in the appointment was 40 minutes* .  All of the patient's questions were answered and the patient knows to call the clinic with any problems, questions, or concerns.  Emaline Saran MD MS AAHIVMS [facility] Hematology/Oncology Physician [facility]  *Total Encounter Time as defined by the Centers for Medicare and Medicaid Services includes, in addition to the face-to-face time of a patient visit (documented in the note above) non-face-to-face time: obtaining and reviewing outside history, ordering and reviewing medications, tests or procedures, care coordination (communications with other health care professionals or caregivers) and documentation in the medical record.  I,  Alan Blowers, acting as a neurosurgeon for Prentis Langdon, MD.,have documented all relevant documentation on the behalf of Emaline Saran, MD,as directed by  Emaline Saran, MD while in the presence of Emaline Saran, MD.  I have reviewed the above documentation for accuracy and completeness, and I agree with the above.  Emaline Saran, MD     [1]  Social History Tobacco Use   Smoking status: Never   Smokeless tobacco: Never  Vaping Use   Vaping status: Never Used  Substance Use Topics   Alcohol  use: Yes    Comment: 09/09/2017 couple glasses of wine q 6 months   Drug use: No   "

## 2025-01-07 ENCOUNTER — Telehealth: Payer: Self-pay | Admitting: Hematology

## 2025-01-07 NOTE — Telephone Encounter (Signed)
 Scheduled patient for next appointments. Called and spoke with the patient, he is aware.

## 2025-01-10 ENCOUNTER — Encounter: Payer: Self-pay | Admitting: Hematology

## 2025-01-10 DIAGNOSIS — D631 Anemia in chronic kidney disease: Secondary | ICD-10-CM | POA: Insufficient documentation

## 2025-01-10 DIAGNOSIS — C9310 Chronic myelomonocytic leukemia not having achieved remission: Secondary | ICD-10-CM | POA: Insufficient documentation

## 2025-01-11 ENCOUNTER — Inpatient Hospital Stay

## 2025-01-11 VITALS — BP 108/70 | HR 70 | Temp 97.8°F | Resp 16

## 2025-01-11 DIAGNOSIS — C9310 Chronic myelomonocytic leukemia not having achieved remission: Secondary | ICD-10-CM

## 2025-01-11 DIAGNOSIS — D6189 Other specified aplastic anemias and other bone marrow failure syndromes: Secondary | ICD-10-CM

## 2025-01-11 DIAGNOSIS — D649 Anemia, unspecified: Secondary | ICD-10-CM

## 2025-01-11 LAB — CBC WITH DIFFERENTIAL/PLATELET
Abs Immature Granulocytes: 0.07 K/uL (ref 0.00–0.07)
Basophils Absolute: 0 K/uL (ref 0.0–0.1)
Basophils Relative: 1 %
Eosinophils Absolute: 0 K/uL (ref 0.0–0.5)
Eosinophils Relative: 0 %
HCT: 26.5 % — ABNORMAL LOW (ref 39.0–52.0)
Hemoglobin: 8.8 g/dL — ABNORMAL LOW (ref 13.0–17.0)
Immature Granulocytes: 1 %
Lymphocytes Relative: 20 %
Lymphs Abs: 1.3 K/uL (ref 0.7–4.0)
MCH: 30.7 pg (ref 26.0–34.0)
MCHC: 33.2 g/dL (ref 30.0–36.0)
MCV: 92.3 fL (ref 80.0–100.0)
Monocytes Absolute: 3.7 K/uL — ABNORMAL HIGH (ref 0.1–1.0)
Monocytes Relative: 58 %
Neutro Abs: 1.3 K/uL — ABNORMAL LOW (ref 1.7–7.7)
Neutrophils Relative %: 20 %
Platelets: 166 K/uL (ref 150–400)
RBC: 2.87 MIL/uL — ABNORMAL LOW (ref 4.22–5.81)
RDW: 14.2 % (ref 11.5–15.5)
WBC: 6.3 K/uL (ref 4.0–10.5)
nRBC: 0 % (ref 0.0–0.2)

## 2025-01-11 MED ORDER — DARBEPOETIN ALFA 60 MCG/0.3ML IJ SOSY
60.0000 ug | PREFILLED_SYRINGE | Freq: Once | INTRAMUSCULAR | Status: AC
  Administered 2025-01-11: 60 ug via SUBCUTANEOUS
  Filled 2025-01-11: qty 0.3

## 2025-02-08 ENCOUNTER — Inpatient Hospital Stay

## 2025-02-08 ENCOUNTER — Inpatient Hospital Stay: Attending: Hematology and Oncology

## 2025-03-08 ENCOUNTER — Inpatient Hospital Stay

## 2025-03-08 ENCOUNTER — Inpatient Hospital Stay: Attending: Hematology and Oncology | Admitting: Hematology
